# Patient Record
Sex: Male | Born: 1940 | Race: White | Hispanic: No | Marital: Married | Smoking: Former smoker
Health system: Southern US, Community
[De-identification: ages and names within clinical notes are randomized; demographics above are authoritative.]

## PROBLEM LIST (undated history)

## (undated) DIAGNOSIS — I10 Essential (primary) hypertension: Secondary | ICD-10-CM

## (undated) DIAGNOSIS — N189 Chronic kidney disease, unspecified: Secondary | ICD-10-CM

## (undated) DIAGNOSIS — E7800 Pure hypercholesterolemia, unspecified: Secondary | ICD-10-CM

## (undated) DIAGNOSIS — K219 Gastro-esophageal reflux disease without esophagitis: Secondary | ICD-10-CM

## (undated) DIAGNOSIS — M797 Fibromyalgia: Secondary | ICD-10-CM

## (undated) DIAGNOSIS — F419 Anxiety disorder, unspecified: Secondary | ICD-10-CM

## (undated) DIAGNOSIS — G4730 Sleep apnea, unspecified: Secondary | ICD-10-CM

## (undated) DIAGNOSIS — R739 Hyperglycemia, unspecified: Secondary | ICD-10-CM

## (undated) DIAGNOSIS — IMO0001 Reserved for inherently not codable concepts without codable children: Secondary | ICD-10-CM

## (undated) DIAGNOSIS — M109 Gout, unspecified: Secondary | ICD-10-CM

## (undated) DIAGNOSIS — IMO0002 Reserved for concepts with insufficient information to code with codable children: Secondary | ICD-10-CM

## (undated) HISTORY — DX: Chronic kidney disease, unspecified: N18.9

## (undated) HISTORY — DX: Essential (primary) hypertension: I10

## (undated) HISTORY — PX: LITHOTRIPSY: SUR834

## (undated) HISTORY — PX: EYE SURGERY: SHX253

## (undated) HISTORY — PX: BACK SURGERY: SHX140

## (undated) HISTORY — PX: OTHER SURGICAL HISTORY: SHX169

## (undated) HISTORY — DX: Pure hypercholesterolemia, unspecified: E78.00

## (undated) HISTORY — DX: Reserved for concepts with insufficient information to code with codable children: IMO0002

## (undated) HISTORY — DX: Hyperglycemia, unspecified: R73.9

## (undated) HISTORY — PX: CARDIAC CATHETERIZATION: SHX172

## (undated) HISTORY — PX: HERNIA REPAIR: SHX51

---

## 1999-06-18 ENCOUNTER — Encounter: Payer: Self-pay | Admitting: Endocrinology

## 1999-06-18 ENCOUNTER — Ambulatory Visit (HOSPITAL_COMMUNITY): Admission: RE | Admit: 1999-06-18 | Discharge: 1999-06-18 | Payer: Self-pay | Admitting: Endocrinology

## 1999-07-04 ENCOUNTER — Encounter: Payer: Self-pay | Admitting: Neurosurgery

## 1999-07-04 ENCOUNTER — Ambulatory Visit (HOSPITAL_COMMUNITY): Admission: RE | Admit: 1999-07-04 | Discharge: 1999-07-04 | Payer: Self-pay | Admitting: Neurosurgery

## 1999-07-18 ENCOUNTER — Encounter: Payer: Self-pay | Admitting: Neurosurgery

## 1999-07-18 ENCOUNTER — Ambulatory Visit (HOSPITAL_COMMUNITY): Admission: RE | Admit: 1999-07-18 | Discharge: 1999-07-18 | Payer: Self-pay | Admitting: Neurosurgery

## 1999-08-01 ENCOUNTER — Ambulatory Visit (HOSPITAL_COMMUNITY): Admission: RE | Admit: 1999-08-01 | Discharge: 1999-08-01 | Payer: Self-pay | Admitting: Neurosurgery

## 1999-08-01 ENCOUNTER — Encounter: Payer: Self-pay | Admitting: Neurosurgery

## 2000-03-05 ENCOUNTER — Encounter: Admission: RE | Admit: 2000-03-05 | Discharge: 2000-03-05 | Payer: Self-pay | Admitting: Neurosurgery

## 2000-03-05 ENCOUNTER — Encounter: Payer: Self-pay | Admitting: Neurosurgery

## 2000-07-18 ENCOUNTER — Ambulatory Visit (HOSPITAL_COMMUNITY): Admission: RE | Admit: 2000-07-18 | Discharge: 2000-07-18 | Payer: Self-pay | Admitting: Urology

## 2001-10-13 ENCOUNTER — Other Ambulatory Visit: Admission: RE | Admit: 2001-10-13 | Discharge: 2001-10-13 | Payer: Self-pay | Admitting: Urology

## 2002-05-11 ENCOUNTER — Encounter: Admission: RE | Admit: 2002-05-11 | Discharge: 2002-05-11 | Payer: Self-pay | Admitting: Neurosurgery

## 2002-05-11 ENCOUNTER — Encounter: Payer: Self-pay | Admitting: Neurosurgery

## 2002-05-25 ENCOUNTER — Encounter: Admission: RE | Admit: 2002-05-25 | Discharge: 2002-05-25 | Payer: Self-pay | Admitting: Neurosurgery

## 2002-05-25 ENCOUNTER — Encounter: Payer: Self-pay | Admitting: Neurosurgery

## 2002-06-08 ENCOUNTER — Encounter: Admission: RE | Admit: 2002-06-08 | Discharge: 2002-06-08 | Payer: Self-pay | Admitting: Neurosurgery

## 2002-06-08 ENCOUNTER — Encounter: Payer: Self-pay | Admitting: Neurosurgery

## 2004-10-25 ENCOUNTER — Encounter: Admission: RE | Admit: 2004-10-25 | Discharge: 2004-10-25 | Payer: Self-pay | Admitting: Neurosurgery

## 2004-11-10 ENCOUNTER — Encounter: Admission: RE | Admit: 2004-11-10 | Discharge: 2004-11-10 | Payer: Self-pay | Admitting: Neurosurgery

## 2004-11-19 ENCOUNTER — Emergency Department (HOSPITAL_COMMUNITY): Admission: EM | Admit: 2004-11-19 | Discharge: 2004-11-19 | Payer: Self-pay | Admitting: Emergency Medicine

## 2004-12-22 ENCOUNTER — Encounter: Admission: RE | Admit: 2004-12-22 | Discharge: 2004-12-22 | Payer: Self-pay | Admitting: Neurosurgery

## 2008-04-10 ENCOUNTER — Encounter: Admission: RE | Admit: 2008-04-10 | Discharge: 2008-04-10 | Payer: Self-pay | Admitting: Neurosurgery

## 2008-08-01 ENCOUNTER — Encounter: Admission: RE | Admit: 2008-08-01 | Discharge: 2008-08-01 | Payer: Self-pay | Admitting: Neurosurgery

## 2008-08-16 ENCOUNTER — Encounter: Admission: RE | Admit: 2008-08-16 | Discharge: 2008-09-29 | Payer: Self-pay | Admitting: Orthopaedic Surgery

## 2008-10-19 ENCOUNTER — Encounter: Admission: RE | Admit: 2008-10-19 | Discharge: 2008-11-04 | Payer: Self-pay | Admitting: Orthopaedic Surgery

## 2008-11-09 ENCOUNTER — Encounter: Admission: RE | Admit: 2008-11-09 | Discharge: 2008-11-30 | Payer: Self-pay | Admitting: Orthopaedic Surgery

## 2009-05-10 ENCOUNTER — Encounter: Admission: RE | Admit: 2009-05-10 | Discharge: 2009-05-10 | Payer: Self-pay | Admitting: Endocrinology

## 2009-12-13 ENCOUNTER — Encounter: Admission: RE | Admit: 2009-12-13 | Discharge: 2009-12-13 | Payer: Self-pay | Admitting: Endocrinology

## 2010-05-09 ENCOUNTER — Encounter: Admission: RE | Admit: 2010-05-09 | Discharge: 2010-05-09 | Payer: Self-pay | Admitting: Neurosurgery

## 2010-05-24 ENCOUNTER — Encounter: Admission: RE | Admit: 2010-05-24 | Discharge: 2010-05-24 | Payer: Self-pay | Admitting: Neurosurgery

## 2010-10-04 ENCOUNTER — Encounter: Admission: RE | Admit: 2010-10-04 | Discharge: 2010-10-04 | Payer: Self-pay | Admitting: Neurosurgery

## 2010-11-07 ENCOUNTER — Encounter: Payer: Self-pay | Admitting: Internal Medicine

## 2010-11-07 ENCOUNTER — Ambulatory Visit
Admission: RE | Admit: 2010-11-07 | Discharge: 2010-11-07 | Payer: Self-pay | Source: Home / Self Care | Attending: Internal Medicine | Admitting: Internal Medicine

## 2010-11-07 DIAGNOSIS — M51379 Other intervertebral disc degeneration, lumbosacral region without mention of lumbar back pain or lower extremity pain: Secondary | ICD-10-CM | POA: Insufficient documentation

## 2010-11-07 DIAGNOSIS — E785 Hyperlipidemia, unspecified: Secondary | ICD-10-CM | POA: Insufficient documentation

## 2010-11-07 DIAGNOSIS — G2581 Restless legs syndrome: Secondary | ICD-10-CM | POA: Insufficient documentation

## 2010-11-07 DIAGNOSIS — M5137 Other intervertebral disc degeneration, lumbosacral region: Secondary | ICD-10-CM | POA: Insufficient documentation

## 2010-11-07 DIAGNOSIS — IMO0001 Reserved for inherently not codable concepts without codable children: Secondary | ICD-10-CM | POA: Insufficient documentation

## 2010-11-07 DIAGNOSIS — I10 Essential (primary) hypertension: Secondary | ICD-10-CM | POA: Insufficient documentation

## 2010-11-07 LAB — CONVERTED CEMR LAB
Cholesterol, target level: 200 mg/dL
HDL goal, serum: 40 mg/dL
LDL Goal: 130 mg/dL

## 2010-11-08 ENCOUNTER — Other Ambulatory Visit: Payer: Self-pay | Admitting: Internal Medicine

## 2010-11-08 ENCOUNTER — Ambulatory Visit
Admission: RE | Admit: 2010-11-08 | Discharge: 2010-11-08 | Payer: Self-pay | Source: Home / Self Care | Attending: Internal Medicine | Admitting: Internal Medicine

## 2010-11-08 LAB — CBC WITH DIFFERENTIAL/PLATELET
Basophils Absolute: 0 10*3/uL (ref 0.0–0.1)
Basophils Relative: 0.6 % (ref 0.0–3.0)
Eosinophils Absolute: 0.5 10*3/uL (ref 0.0–0.7)
Eosinophils Relative: 8 % — ABNORMAL HIGH (ref 0.0–5.0)
HCT: 32.2 % — ABNORMAL LOW (ref 39.0–52.0)
Hemoglobin: 10.8 g/dL — ABNORMAL LOW (ref 13.0–17.0)
Lymphocytes Relative: 30.7 % (ref 12.0–46.0)
Lymphs Abs: 2.1 10*3/uL (ref 0.7–4.0)
MCHC: 33.5 g/dL (ref 30.0–36.0)
MCV: 94.4 fl (ref 78.0–100.0)
Monocytes Absolute: 0.6 10*3/uL (ref 0.1–1.0)
Monocytes Relative: 9.1 % (ref 3.0–12.0)
Neutro Abs: 3.5 10*3/uL (ref 1.4–7.7)
Neutrophils Relative %: 51.6 % (ref 43.0–77.0)
Platelets: 188 10*3/uL (ref 150.0–400.0)
RBC: 3.41 Mil/uL — ABNORMAL LOW (ref 4.22–5.81)
RDW: 13.5 % (ref 11.5–14.6)
WBC: 6.7 10*3/uL (ref 4.5–10.5)

## 2010-11-08 LAB — IBC PANEL
Iron: 89 ug/dL (ref 42–165)
Saturation Ratios: 27.1 % (ref 20.0–50.0)
Transferrin: 234.7 mg/dL (ref 212.0–360.0)

## 2010-11-08 LAB — LIPID PANEL
Cholesterol: 115 mg/dL (ref 0–200)
HDL: 37.9 mg/dL — ABNORMAL LOW (ref >39.00)
Total CHOL/HDL Ratio: 3
Triglycerides: 241 mg/dL — ABNORMAL HIGH (ref 0.0–149.0)
VLDL: 48.2 mg/dL — ABNORMAL HIGH (ref 0.0–40.0)

## 2010-11-08 LAB — HEPATIC FUNCTION PANEL
ALT: 20 U/L (ref 0–53)
AST: 24 U/L (ref 0–37)
Albumin: 3.5 g/dL (ref 3.5–5.2)
Alkaline Phosphatase: 48 U/L (ref 39–117)
Bilirubin, Direct: 0.1 mg/dL (ref 0.0–0.3)
Total Bilirubin: 0.2 mg/dL — ABNORMAL LOW (ref 0.3–1.2)
Total Protein: 6.7 g/dL (ref 6.0–8.3)

## 2010-11-08 LAB — BASIC METABOLIC PANEL
BUN: 32 mg/dL — ABNORMAL HIGH (ref 6–23)
CO2: 24 mEq/L (ref 19–32)
Calcium: 8.8 mg/dL (ref 8.4–10.5)
Chloride: 111 mEq/L (ref 96–112)
Creatinine, Ser: 1.8 mg/dL — ABNORMAL HIGH (ref 0.4–1.5)
GFR: 39.82 mL/min — ABNORMAL LOW (ref >60.00)
Glucose, Bld: 82 mg/dL (ref 70–99)
Potassium: 5.1 mEq/L (ref 3.5–5.1)
Sodium: 141 mEq/L (ref 135–145)

## 2010-11-08 LAB — PSA: PSA: 0.15 ng/mL (ref 0.10–4.00)

## 2010-11-08 LAB — FERRITIN: Ferritin: 181.3 ng/mL (ref 22.0–322.0)

## 2010-11-08 LAB — TSH: TSH: 1.65 u[IU]/mL (ref 0.35–5.50)

## 2010-11-08 LAB — LDL CHOLESTEROL, DIRECT: Direct LDL: 49.3 mg/dL

## 2010-11-08 LAB — CK: Total CK: 173 U/L (ref 7–232)

## 2010-11-17 ENCOUNTER — Telehealth: Payer: Self-pay | Admitting: Internal Medicine

## 2010-11-25 ENCOUNTER — Encounter: Payer: Self-pay | Admitting: Neurosurgery

## 2010-12-07 NOTE — Progress Notes (Signed)
Summary: Not understanding labs  Phone Note Call from Patient Call back at Home Phone [phone]   Summary of Call: Patient left message on triage requesting somone call him to go over his lab results so he can better understand them. Left message on machine to call back to office. Lucious Groves CMA,  November 17, 2010 3:51 PM  No return call, left message on machine to call back to office. Lucious Groves CMA  November 20, 2010 10:14 AM   Follow-up for Phone Call        I spoke with patient about his labs. He is aware that we can send Lisinopril prescription straight to the pharmacy for him. Follow-up by: Lucious Groves CMA,  November 20, 2010 2:57 PM    New/Updated Medications: LISINOPRIL 20 MG TABS (LISINOPRIL) 1 by mouth qd Prescriptions: LISINOPRIL 20 MG TABS (LISINOPRIL) 1 by mouth qd  #30 x 1   Entered by:   Lucious Groves CMA   Authorized by:   Marga Melnick MD   Signed by:   Lucious Groves CMA on 11/20/2010   Method used:   Faxed to ...       [facility] Public relations account executive (retail)       [address] [place]       [address]       [place], [place]  [address]  [place]       Ph: [phone]       Fax: [phone]   RxID:   [id number]

## 2010-12-07 NOTE — Assessment & Plan Note (Signed)
Summary: new to establish//ph   Vital Signs:  Patient profile:   70 year old male Height:      69.5 inches Weight:      208.6 pounds BMI:     30.47 Temp:     98.4 degrees F oral Pulse rate:   56 / minute Resp:     14 per minute BP sitting:   114 / 70  (left arm) Cuff size:   large  Vitals Entered By: Shonna Chock CMA (November 07, 2010 9:39 AM) CC: New Patient Establish: Discuss changing Lexapro to a less expensive med and discuss Crestor (? related to leg cramps) , Back pain, Lipid Management   CC:  New Patient Establish: Discuss changing Lexapro to a less expensive med and discuss Crestor (? related to leg cramps) , Back pain, and Lipid Management.  History of Present Illness:    Mr Shawn Cruz is her to establish as a new patient;he does not want a CPX.His major issue is DDD for which he sees Dr Trey Sailors on  annual  schedule & as needed . The pain is dull - sharp  located in the mid low back.  The pain radiates to the right leg  to  the knee with numbness & tingling.  The pain is made worse by standing , sitting and flexion.  The pain is made better by inactivity and opioids. The patient denies fecal incontinence, urinary incontinence, and urinary retention.      Hyperlipidemia Follow-Up: The patient reports muscle aches in legs @ night ( his wife descirbes " kicking like an old [place]") and constipation ( he is on narcotic), but denies GI upset, abdominal pain, flushing, itching, diarrhea, and fatigue.  Other symptoms include  occasional pedal edema.  The patient denies the following symptoms: chest pain/pressure, exercise intolerance, dypsnea, palpitations, and syncope.  Compliance with medications (by patient report) has been near 100%.  The patient reports no exercise.  Adjunctive measures currently used by the patient include ASA. Lipids last done 11/2009.     Hypertension Follow-Up: The patient denies lightheadedness, urinary frequency, and headaches.  Compliance with medications (by  patient report) has been near 100%.  Adjunctive measures currently used by the patient include   modified salt restriction.  BP @ home 126-130/ ?.   Lipid Management History:      Positive NCEP/ATP III risk factors include male age 65 years old or older and hypertension.  Negative NCEP/ATP III risk factors include non-diabetic, no family history for ischemic heart disease, non-tobacco-user status, no ASHD (atherosclerotic heart disease), no prior stroke/TIA, no peripheral vascular disease, and no history of aortic aneurysm.     Preventive Screening-Counseling & Management  Alcohol-Tobacco     Smoking Status: quit  Current Medications (verified): 1)  Aspirin 81 Mg Tabs (Aspirin) .[name].. 1 By Mouth Once Daily 2)  Crestor 10 Mg Tabs (Rosuvastatin Calcium) .[name].. 1 By Mouth At Bedtime 3)  Lisinopril-Hydrochlorothiazide 20-12.5 Mg Tabs (Lisinopril-Hydrochlorothiazide) .[name].. 1 By Mouth Once Daily 4)  Hydrocodone-Acetaminophen 10-325 Mg Tabs (Hydrocodone-Acetaminophen) .[name].. 1 By Mouth Up To 6 X Daily As Needed 5)  Lexapro 10 Mg Tabs (Escitalopram Oxalate) .[name].. 1 By Mouth Once Daily 6)  Meloxicam 7.5 Mg Tabs (Meloxicam) .[name].. 1 By Mouth Once Daily  Allergies (verified): 1)  ! Sulfa  Past History:  Past Medical History: DDD LS spine Hyperlipidemia: Framingham Study LDL goal  = < 130. Hypertension  Past Surgical History: Lumbar discectomy  1977; S/P ESI , Dr Channing Mutters  Rotator cuff repair 2011, Dr Azzie Glatter Colonoscopy : negative X 3, due 2021, Dr Kinnie Scales  Family History: Father: deceased- MI in 60s/High Blood Pressure (HTN) Mother: deceased- MI in 70s/High Blood Pressure (HTN)  Siblings: sister  deceased-Abd Aortic Aneurysm  Social History: Retired Married Former Smoker: quit 2009 Alcohol use-yes: minimally Smoking Status:  quit  Review of Systems Psych:  Sytable on Lexapro , but it is "going through the roof" in reference to cost..  Physical Exam  General:  in no acute distress;  alert,appropriate and cooperative throughout examination Neck:  No deformities, masses, or tenderness noted. Chest Wall:  barrel-chest deformity.   Lungs:  Normal respiratory effort, chest expands symmetrically. Lungs are clear to auscultation, no crackles or wheezes. Heart:  regular rhythm, no murmur, no gallop, no rub, no JVD, no HJR, and bradycardia.   Distant heart sounds Abdomen:  Bowel sounds positive,abdomen soft and non-tender without masses, organomegaly or hernias noted. No bruits or AAA Msk:  No deformity or scoliosis noted of thoracic or lumbar spine.   Pulses:  R and L carotid,radial,dorsalis pedis and posterior tibial pulses are full and equal bilaterally Extremities:  No clubbing, cyanosis, edema. Deformed L great nail. Decreased ROM of LE; crepitus of knees  Neurologic:  alert & oriented X3, strength normal in all extremities, and DTRs symmetrical and normal.   Skin:  Intact without suspicious lesions or rashes Cervical Nodes:  No lymphadenopathy noted Axillary Nodes:  No palpable lymphadenopathy Psych:  memory intact for recent and remote, normally interactive, and good eye contact.     Impression & Recommendations:  Problem # 1:  MUSCLE PAIN (ICD-729.1) on statin; R/O RLS  His updated medication list for this problem includes:    Aspirin 81 Mg Tabs (Aspirin) .[name]... 1 by mouth once daily    Hydrocodone-acetaminophen 10-325 Mg Tabs (Hydrocodone-acetaminophen) .[name]... 1 by mouth up to 6 x daily as needed    Meloxicam 7.5 Mg Tabs (Meloxicam) .[name]... 1 by mouth once daily  Problem # 2:  HYPERTENSION (ICD-401.9)  controlled His updated medication list for this problem includes:    Lisinopril-hydrochlorothiazide 20-12.5 Mg Tabs (Lisinopril-hydrochlorothiazide) .[name]... 1 by mouth once daily  Orders: EKG w/ Interpretation (93000)  Problem # 3:  HYPERLIPIDEMIA (ICD-272.4)  His updated medication list for this problem includes:    Crestor 10 Mg Tabs (Rosuvastatin calcium) .[name]... 1  by mouth at bedtime  Problem # 4:  DEGENERATIVE DISC DISEASE, LUMBOSACRAL SPINE (ICD-722.52) as per Dr Channing Mutters  Problem # 5:  RESTLESS LEG SYNDROME (ICD-333.94) suggested by wife's history  Complete Medication List: 1)  Aspirin 81 Mg Tabs (Aspirin) .[name].. 1 by mouth once daily 2)  Crestor 10 Mg Tabs (Rosuvastatin calcium) .[name].. 1 by mouth at bedtime 3)  Lisinopril-hydrochlorothiazide 20-12.5 Mg Tabs (Lisinopril-hydrochlorothiazide) .[name].. 1 by mouth once daily 4)  Hydrocodone-acetaminophen 10-325 Mg Tabs (Hydrocodone-acetaminophen) .[name].. 1 by mouth up to 6 x daily as needed 5)  Meloxicam 7.5 Mg Tabs (Meloxicam) .[name].. 1 by mouth once daily 6)  Citalopram Hydrobromide 20 Mg Tabs (Citalopram hydrobromide) .[name].. 1 once daily in place of lexapro 10 mg  Lipid Assessment/Plan:      Based on NCEP/ATP III, the patient's risk factor category is "0-1 risk factors".  The patient's lipid goals are as follows: Total cholesterol goal is 200; LDL cholesterol goal is 130; HDL cholesterol goal is 40; Triglyceride goal is 150.    Patient Instructions: 1)  Please schedule fasting  labs: 2)  BMP (401.9); 3)  Hepatic Panel  &  CPK ( 995.20); 4)  Lipid Panel (272.4); 5)  TSH (272.4); 6)  CBC w/ Diff, ferritin, IBC (333.94); PSA ( 600.9). 7)  Check your Blood Pressure regularly. If it is above: 135/85 ON AVERAGE  you should make an appointment. Prescriptions: CITALOPRAM HYDROBROMIDE 20 MG TABS (CITALOPRAM HYDROBROMIDE) 1 once daily in place of Lexapro 10 mg  #30 x 5   Entered and Authorized by:   Marga Melnick MD   Signed by:   Marga Melnick MD on 11/07/2010   Method used:   Print then Give to Patient   RxID:   [id number]    Orders Added: 1)  New Patient Level IV [99204] 2)  EKG w/ Interpretation [93000]

## 2011-01-01 ENCOUNTER — Other Ambulatory Visit (INDEPENDENT_AMBULATORY_CARE_PROVIDER_SITE_OTHER): Payer: Medicare Other

## 2011-01-01 ENCOUNTER — Encounter (INDEPENDENT_AMBULATORY_CARE_PROVIDER_SITE_OTHER): Payer: Self-pay | Admitting: *Deleted

## 2011-01-01 ENCOUNTER — Other Ambulatory Visit: Payer: Self-pay

## 2011-01-01 DIAGNOSIS — I10 Essential (primary) hypertension: Secondary | ICD-10-CM

## 2011-01-01 DIAGNOSIS — Z79899 Other long term (current) drug therapy: Secondary | ICD-10-CM

## 2011-01-01 LAB — CBC WITH DIFFERENTIAL/PLATELET
Basophils Absolute: 0 10*3/uL (ref 0.0–0.1)
Basophils Relative: 0.6 % (ref 0.0–3.0)
Eosinophils Absolute: 0.6 10*3/uL (ref 0.0–0.7)
Eosinophils Relative: 8.2 % — ABNORMAL HIGH (ref 0.0–5.0)
HCT: 33.4 % — ABNORMAL LOW (ref 39.0–52.0)
Hemoglobin: 11.4 g/dL — ABNORMAL LOW (ref 13.0–17.0)
Lymphocytes Relative: 22 % (ref 12.0–46.0)
Lymphs Abs: 1.7 10*3/uL (ref 0.7–4.0)
MCHC: 34.1 g/dL (ref 30.0–36.0)
MCV: 91.9 fl (ref 78.0–100.0)
Monocytes Absolute: 0.5 10*3/uL (ref 0.1–1.0)
Neutro Abs: 4.8 10*3/uL (ref 1.4–7.7)
Neutrophils Relative %: 63.1 % (ref 43.0–77.0)
Platelets: 210 10*3/uL (ref 150.0–400.0)
RBC: 3.64 Mil/uL — ABNORMAL LOW (ref 4.22–5.81)
WBC: 7.6 10*3/uL (ref 4.5–10.5)

## 2011-01-01 LAB — B12 AND FOLATE PANEL: Vitamin B-12: 339 pg/mL (ref 211–911)

## 2011-01-01 LAB — BUN: BUN: 29 mg/dL — ABNORMAL HIGH (ref 6–23)

## 2011-01-01 LAB — CREATININE, SERUM: Creatinine, Ser: 1.8 mg/dL — ABNORMAL HIGH (ref 0.4–1.5)

## 2011-01-03 ENCOUNTER — Ambulatory Visit (INDEPENDENT_AMBULATORY_CARE_PROVIDER_SITE_OTHER): Payer: Medicare Other | Admitting: Internal Medicine

## 2011-01-03 ENCOUNTER — Other Ambulatory Visit: Payer: Self-pay | Admitting: Internal Medicine

## 2011-01-03 ENCOUNTER — Encounter: Payer: Self-pay | Admitting: Internal Medicine

## 2011-01-03 DIAGNOSIS — L89899 Pressure ulcer of other site, unspecified stage: Secondary | ICD-10-CM

## 2011-01-03 DIAGNOSIS — N259 Disorder resulting from impaired renal tubular function, unspecified: Secondary | ICD-10-CM | POA: Insufficient documentation

## 2011-01-03 DIAGNOSIS — D649 Anemia, unspecified: Secondary | ICD-10-CM

## 2011-01-03 DIAGNOSIS — I10 Essential (primary) hypertension: Secondary | ICD-10-CM

## 2011-01-03 DIAGNOSIS — Z79899 Other long term (current) drug therapy: Secondary | ICD-10-CM

## 2011-01-03 LAB — HEMOGLOBIN A1C: Hgb A1c MFr Bld: 6.3 % (ref 4.6–6.5)

## 2011-01-05 ENCOUNTER — Other Ambulatory Visit: Payer: Self-pay | Admitting: Neurosurgery

## 2011-01-05 DIAGNOSIS — M47816 Spondylosis without myelopathy or radiculopathy, lumbar region: Secondary | ICD-10-CM

## 2011-01-09 ENCOUNTER — Ambulatory Visit
Admission: RE | Admit: 2011-01-09 | Discharge: 2011-01-09 | Disposition: A | Discharge status: Home / Self Care | Payer: Medicare Other | Source: Ambulatory Visit | Attending: Neurosurgery | Admitting: Neurosurgery

## 2011-01-09 DIAGNOSIS — M47816 Spondylosis without myelopathy or radiculopathy, lumbar region: Secondary | ICD-10-CM

## 2011-01-11 NOTE — Assessment & Plan Note (Signed)
Summary: 6 WEEK FOLLOWUO AND DISCUSS LABS//KB   Vital Signs:  Patient profile:   70 year old male Height:      69.5 inches (176.53 cm) Weight:      212.13 pounds (96.42 kg) BMI:     30.99 Temp:     98.2 degrees F (36.78 degrees C) oral Resp:     15 per minute BP sitting:   120 / 72  (left arm) Cuff size:   regular  Vitals Entered By: Lucious Groves CMA (January 03, 2011 9:04 AM) CC: 6 week f/u and discuss labs./kb, Back pain Comments Patient notes that he has several questions. He has an area on his foot that is not healing x several weeks, and needs Meloxicam replacement. Patient notes that he has been taking his BP at home and it has never read as high as todays reading. I made the patient aware that todays reading is good and automated machines are a good guage but are not as reliable as listening in person. I let the patient rest awhile and BP went down to 110/66.   CC:  6 week f/u and discuss labs./kb and Back pain.  History of Present Illness:      This is a 70 year old man who presents with Back pain which has  increased off Meloxicam due renal insufficiency.  The patient reports loss of sensation  in legs and rest pain, but denies fever, chills, weakness, fecal incontinence, urinary incontinence, urinary retention, and dysuria.  The pain is located in the mid low back.  The pain radiates to the left leg  > R  above  the knee.  The pain is made worse by flexion and activity.  The pain is made better by opioids.  He sees Dr Channing Mutters  03/01.                                              He has had  REDNESS @ R  LATERAL MALLEOLUS W/O INJURY; Rx: Neosporin topical steroid. His Dermatologist recommended "corn pads".                                                                                                             Labs reviewed : creat stable @ 1.8; anemia slightly improved . B12 & folate were WNL.  Current Medications (verified): 1)  Aspirin 81 Mg Tabs (Aspirin) .[name].. 1 By Mouth Once  Daily 2)  Crestor 10 Mg Tabs (Rosuvastatin Calcium) .[name].. 1 By Mouth At Bedtime 3)  Lisinopril 20 Mg Tabs (Lisinopril) .[name].. 1 By Mouth Qd 4)  Hydrocodone-Acetaminophen 10-325 Mg Tabs (Hydrocodone-Acetaminophen) .[name].. 1 By Mouth Up To 6 X Daily As Needed 5)  Citalopram Hydrobromide 20 Mg Tabs (Citalopram Hydrobromide) .[name].. 1 Once Daily in Place of Lexapro 10 Mg  Allergies (verified): 1)  ! Sulfa  Physical Exam  General:  well-nourished,in no acute distress; alert,appropriate and cooperative throughout  examination Lungs:  Normal respiratory effort, chest expands symmetrically. Lungs are clear to auscultation, no crackles or wheezes. Heart:  bradycardia.  S4  Pulses:  R and L carotid,radial,dorsalis pedis and posterior tibial pulses are full and equal bilaterally Extremities:  No clubbing, cyanosis, edema. Chronic nail deformity  Neurologic:  alert & oriented X3 and DTRs symmetrical and normal.  Pain LS area swith SLR; cotton ball felt over feet  yet no painto palpation R ankle lesion Skin:  faint erythema R lat malleolus, non tender Psych:  memory intact for recent and remote, normally interactive, and good eye contact.     Impression & Recommendations:  Problem # 1:  RENAL INSUFFICIENCY (ICD-588.9) Assessment Unchanged  Orders: Nephrology Referral (Nephro) Venipuncture (14782) TLB-A1C / Hgb A1C (Glycohemoglobin) (83036-A1C)  Problem # 2:  ANEMIA (ICD-285.9) due to #1  Problem # 3:  HYPERTENSION (ICD-401.9) well controlled His updated medication list for this problem includes:    Lisinopril 20 Mg Tabs (Lisinopril) .[name]... 1 by mouth qd  Problem # 4:  PRESSURE ULCER OTHER SITE (ICD-707.09)  Orders: Wound Care Center Referral (Wound Care) Venipuncture (95621) TLB-A1C / Hgb A1C (Glycohemoglobin) (83036-A1C)  Complete Medication List: 1)  Aspirin 81 Mg Tabs (Aspirin) .[name].. 1 by mouth once daily 2)  Crestor 10 Mg Tabs (Rosuvastatin calcium) .[name].. 1 by mouth at bedtime 3)   Lisinopril 20 Mg Tabs (Lisinopril) .[name].. 1 by mouth qd 4)  Hydrocodone-acetaminophen 10-325 Mg Tabs (Hydrocodone-acetaminophen) .[name].. 1 by mouth up to 6 x daily as needed 5)  Citalopram Hydrobromide 20 Mg Tabs (Citalopram hydrobromide) .[name].. 1 once daily in place of lexapro 10 mg  Patient Instructions: 1)  Check your Blood Pressure regularly. If it is above: 135/85 ON AVERAGE  you should make an appointment.   Orders Added: 1)  Est. Patient Level IV [30865] 2)  Nephrology Referral [Nephro] 3)  Wound Care Center Referral [Wound Care] 4)  Venipuncture [36415] 5)  TLB-A1C / Hgb A1C (Glycohemoglobin) [83036-A1C]  Appended Document: 6 WEEK FOLLOWUO AND DISCUSS LABS//KB

## 2011-01-16 ENCOUNTER — Encounter (HOSPITAL_BASED_OUTPATIENT_CLINIC_OR_DEPARTMENT_OTHER): Payer: Medicare Other | Attending: Plastic Surgery

## 2011-01-16 DIAGNOSIS — L851 Acquired keratosis [keratoderma] palmaris et plantaris: Secondary | ICD-10-CM | POA: Insufficient documentation

## 2011-01-16 DIAGNOSIS — I8390 Asymptomatic varicose veins of unspecified lower extremity: Secondary | ICD-10-CM | POA: Insufficient documentation

## 2011-01-16 DIAGNOSIS — Z7982 Long term (current) use of aspirin: Secondary | ICD-10-CM | POA: Insufficient documentation

## 2011-01-16 DIAGNOSIS — I10 Essential (primary) hypertension: Secondary | ICD-10-CM | POA: Insufficient documentation

## 2011-01-16 DIAGNOSIS — E119 Type 2 diabetes mellitus without complications: Secondary | ICD-10-CM | POA: Insufficient documentation

## 2011-01-16 DIAGNOSIS — Z79899 Other long term (current) drug therapy: Secondary | ICD-10-CM | POA: Insufficient documentation

## 2011-01-16 DIAGNOSIS — N289 Disorder of kidney and ureter, unspecified: Secondary | ICD-10-CM | POA: Insufficient documentation

## 2011-01-19 ENCOUNTER — Telehealth (INDEPENDENT_AMBULATORY_CARE_PROVIDER_SITE_OTHER): Payer: Self-pay | Admitting: *Deleted

## 2011-01-23 NOTE — Progress Notes (Signed)
Summary: Lisinopril refill  Phone Note Refill Request Message from:  Fax from Pharmacy on January 19, 2011 9:50 AM  Refills Requested: Medication #1:  LISINOPRIL 20 MG TABS 1 by mouth qd [facility], [address], [place], [place]  [address]   phone - [phone]    fax - [phone]  qty - 30  Next Appointment Scheduled: lab = 7/10 Initial call taken by: Jerolyn Shin,  January 19, 2011 9:50 AM    Prescriptions: LISINOPRIL 20 MG TABS (LISINOPRIL) 1 by mouth qd  #30 x 5   Entered by:   Shonna Chock CMA   Authorized by:   Marga Melnick MD   Signed by:   Shonna Chock CMA on 01/19/2011   Method used:   Faxed to ...       [facility] Public relations account executive (retail)       [address] [place]       [address]       [place], [place]  [address]  [place]       Ph: [phone]       Fax: [phone]   RxID:   [id number]

## 2011-01-26 NOTE — H&P (Signed)
  NAME:  Shawn Cruz NO.:  [id number]  MEDICAL RECORD NO.:  [id number]           PATIENT TYPE:  I  LOCATION:  FOOT                         FACILITY:  [facility]  PHYSICIAN:  Wayland Denis, DO      DATE OF BIRTH:  August 16, 1941  DATE OF ADMISSION:  01/16/2011 DATE OF DISCHARGE:                             HISTORY & PHYSICAL   CHIEF COMPLAINT:  Redness of right lateral malleolus.  HISTORY OF PRESENT ILLNESS:  Shawn Cruz is a 70 year old white male who is here with his wife for evaluation of a right lateral ankle redness. He denies any pain or soreness in the area.  Denies any previous trauma. He says it has been going on for several weeks and he is concerned that there may be a wound in the future.  He was recently found to have high blood sugars and states that he is trying to work with his diet.  He does have renal insufficiency as well and is seeing a nephrologist for this.  Some of these are new diagnoses that he is working on.  He is fairly active in golf.  Today, he is not using any lotions or creams and not particularly keeping his leg elevated and has not seen anybody else about this redness.  He has not had any recent illnesses other than the things I stated.  PAST MEDICAL HISTORY:  High blood pressure, diabetes, renal insufficiency, anemia, history of back injury, and cataracts.  SURGICAL HISTORY:  Back surgery.  ALLERGIES:  SULFA.  MEDICATIONS:  Aspirin, Crestor, lisinopril, citalopram, hydrocodone, acetaminophen.  SOCIAL HISTORY:  He is married, lives with his wife and has 2 grown kids.  REVIEW OF SYSTEMS:  He denies any change in his review of systems.  No weight change, hematuria, hematochezia, trouble breathing, chest pain, or abdominal pain.  PHYSICAL EXAMINATION:  GENERAL:  He is alert, oriented, and cooperative, in no acute distress.  He is accompanied by his wife. HEENT:  Pupils were small but equal and reactive.  Extraocular  movements are intact. NECK:  Nontender.  No lymphadenopathy.  Breathing is unlabored. HEART:  Regular.  Distal pulses are equal and regular bilaterally, upper and lower extremity strength 5/5. ABDOMEN:  Soft and nontender.  No organomegaly.  Right lateral ankle is slightly red, but no sign of cellulitis or infection.  Pulses are strong. SKIN:  Quite dry. NEUROLOGIC: Cranial nerves II-XII grossly intact.  ASSESSMENT:  Dry skin and varicose veins.  Plan for compression stockings on a regular basis and keep his leg elevated while he is at home.  Alter his shoes.  Do not rub on the area.  See podiatrist for regular foot care and Eucerin cream daily.  We will see him back as needed.     Wayland Denis, DO     CS/MEDQ  D:  01/16/2011  T:  01/17/2011  Job:  [id number]  Electronically Signed by Wayland Denis  on 01/19/2011 01:29:21 PM

## 2011-01-29 ENCOUNTER — Encounter: Payer: Self-pay | Admitting: Internal Medicine

## 2011-01-29 ENCOUNTER — Ambulatory Visit (INDEPENDENT_AMBULATORY_CARE_PROVIDER_SITE_OTHER): Payer: Medicare Other | Admitting: Internal Medicine

## 2011-01-29 VITALS — BP 94/62 | HR 72 | Temp 98.3°F | Wt 198.8 lb

## 2011-01-29 DIAGNOSIS — R0600 Dyspnea, unspecified: Secondary | ICD-10-CM

## 2011-01-29 DIAGNOSIS — R5383 Other fatigue: Secondary | ICD-10-CM

## 2011-01-29 DIAGNOSIS — I10 Essential (primary) hypertension: Secondary | ICD-10-CM

## 2011-01-29 DIAGNOSIS — R0609 Other forms of dyspnea: Secondary | ICD-10-CM

## 2011-01-29 DIAGNOSIS — N289 Disorder of kidney and ureter, unspecified: Secondary | ICD-10-CM

## 2011-01-29 DIAGNOSIS — R5381 Other malaise: Secondary | ICD-10-CM

## 2011-01-29 MED ORDER — LISINOPRIL 20 MG PO TABS: 10.0000 mg | ORAL_TABLET | Freq: Every day | ORAL | Status: DC

## 2011-01-29 NOTE — Progress Notes (Signed)
  Subjective:    Patient ID: Shawn Cruz, male    DOB: 1941-03-03, 70 y.o.   MRN: [id number]  HPI   He is here for blood pressure followup but he has new symptoms which occurred within the last 2 weeks.  Disease Monitoring  Blood pressure range:97/52-133/69  Chest pain: no; he also denies headaches, nosebleeds, palpitations, claudication symptoms, and syncope.  NEW ONSET:DOE &  FATIGUE  Onset:10 days ago of fatigue even  without  exertion: Physical limitations: essentially sedentary Primarily motivational fatigue:no Primarily physical fatigue: yes  Symptoms Fever:no Night sweats:yes;Weight loss:no Exertional chest pain:no Cough:no Hemoptysis: no;New medications:steroid shot 2 weeks ago PND:no;Melena:no Adenopathy:no Severe snoring:improved as per wife; intermittent mild apnea. Remotely he has had a sleep apnea( ? 2004) study which was negative. Daytime sleepiness:yes  Skin changes:dry; no nail or hair changes  Feeling depressed:no Anhedonia:no Altered appetite:no Poor sleep: no                                                                                                                      BP assessment continued :  Medication compliance:good Medication Side Effects  Lightheadedness:no  Urinary frequency:minimal  Edema:improved   Preventitive Healthcare:  Exercise:none due to fatigue  Diet Pattern:decreased sugar , bread, HFCS Salt Restriction:yes      Review of Systemssee above    Objective:   Physical Exam  On exam he is in no acute distress. There's no jaundice or scleral icterus. Extraocular motion is intact. Minimal midline is present.   There is no lymphadenopathy about the head neck or axilla. Thyroid is normal to palpation without nodularity.  Chest is clear with no rales rhonchi or increased work of breathing    rhythm is regular with an S4; no significant murmurs or gallops are present.   he has no organomegaly or masses.   there is no pedal  edema; he has no cyanosis or clubbing of the nailbeds.   All pulses are intact no bruits were noted. Deep tendon  reflexes are normal; he has no tremor.  affect and mood appear to be normal.        Assessment & Plan:   #1 hypertension extremely well controlled if not controlled.    #2 fatigue present for approximately 2 weeks    #3 dyspnea    #4 snoring and possible apnea with a past medical history is negative evaluation

## 2011-01-29 NOTE — Patient Instructions (Addendum)
Please DECREASE Lisinopril to 20 mg 1/2 pill daily. Please  monitor your blood pressure routinely through the week; your goal was an average less than 135/85. If it averages greater than 140/90 you should be seen.    I am asking your spouse to observe for possible sleep apnea ; this process can cause fatigue although usually would be of long-standing. If the evaluation is negative and symptoms persist or progress we may need to consider repeating the sleep study.

## 2011-01-30 ENCOUNTER — Other Ambulatory Visit: Payer: Self-pay | Admitting: Internal Medicine

## 2011-01-30 ENCOUNTER — Ambulatory Visit (INDEPENDENT_AMBULATORY_CARE_PROVIDER_SITE_OTHER)
Admission: RE | Admit: 2011-01-30 | Discharge: 2011-01-30 | Disposition: A | Discharge status: Home / Self Care | Payer: Medicare Other | Source: Ambulatory Visit | Attending: Internal Medicine | Admitting: Internal Medicine

## 2011-01-30 DIAGNOSIS — R0609 Other forms of dyspnea: Secondary | ICD-10-CM

## 2011-01-30 DIAGNOSIS — N19 Unspecified kidney failure: Secondary | ICD-10-CM

## 2011-01-30 DIAGNOSIS — R0600 Dyspnea, unspecified: Secondary | ICD-10-CM

## 2011-01-30 DIAGNOSIS — E875 Hyperkalemia: Secondary | ICD-10-CM

## 2011-01-30 LAB — BASIC METABOLIC PANEL
CO2: 18 mEq/L — ABNORMAL LOW (ref 19–32)
Calcium: 8.9 mg/dL (ref 8.4–10.5)
Chloride: 107 mEq/L (ref 96–112)
GFR: 36.96 mL/min — ABNORMAL LOW (ref >60.00)
Potassium: 6.1 mEq/L (ref 3.5–5.1)
Sodium: 134 mEq/L — ABNORMAL LOW (ref 135–145)

## 2011-01-30 LAB — TSH: TSH: 1.41 u[IU]/mL (ref 0.35–5.50)

## 2011-01-30 LAB — CBC WITH DIFFERENTIAL/PLATELET
Basophils Absolute: 0 10*3/uL (ref 0.0–0.1)
Basophils Relative: 0.2 % (ref 0.0–3.0)
Eosinophils Absolute: 0.3 10*3/uL (ref 0.0–0.7)
Eosinophils Relative: 2.9 % (ref 0.0–5.0)
HCT: 40 % (ref 39.0–52.0)
Lymphocytes Relative: 31 % (ref 12.0–46.0)
Lymphs Abs: 2.7 10*3/uL (ref 0.7–4.0)
MCHC: 33.9 g/dL (ref 30.0–36.0)
MCV: 92.5 fl (ref 78.0–100.0)
Monocytes Absolute: 0.8 10*3/uL (ref 0.1–1.0)
Monocytes Relative: 9.3 % (ref 3.0–12.0)
Neutrophils Relative %: 56.6 % (ref 43.0–77.0)
Platelets: 161 10*3/uL (ref 150.0–400.0)
RDW: 13.1 % (ref 11.5–14.6)
WBC: 8.8 10*3/uL (ref 4.5–10.5)

## 2011-01-31 ENCOUNTER — Telehealth: Payer: Self-pay

## 2011-01-31 MED ORDER — FUROSEMIDE 40 MG PO TABS: 40.0000 mg | ORAL_TABLET | Freq: Every day | ORAL | Status: DC

## 2011-01-31 NOTE — Telephone Encounter (Signed)
Potassium is elevated significantly in the setting of renal impairment which is stable to slightly worse. I have reviewed medications; none of these should be causing the elevated potassium. Avoid all potassium-containing foods such as citrus. Nephrology referral is indicated. The anemia has resolved and other studies are essentially negative.  Spoke with patient about lab results, patient ok'd all information and aware copy of labs to be mailed

## 2011-01-31 NOTE — Telephone Encounter (Signed)
Message copied by Stephan Minister on Wed Jan 31, 2011  9:50 AM ------      Message from: Marga Melnick      Created: Tue Jan 30, 2011  6:29 PM       Potassium is elevated significantly in the setting of renal impairment which is stable to slightly worse. I have reviewed medications; none of these should be causing the elevated potassium. Avoid all potassium-containing foods such as citrus. Nephrology referral is indicated. The anemia has resolved and other studies are essentially negative.

## 2011-01-31 NOTE — Telephone Encounter (Signed)
Called patient back to give additional information regarding labs (Info was sent to Triage Assistant-Chemira). Patient aware rx was sent to pharmacy and schedule appointment for Monday 02/04/11 to recheck labs

## 2011-01-31 NOTE — Telephone Encounter (Signed)
Copied from staff messages- K+ very high due to kidney impairment( Nephrology referral made) ; this fluid pill will reduce K+. Recheck BUN,creat ,K+ in 5 days. This is remotely related to sulfa antibiotics; report rash or fever. I do not expect this

## 2011-02-05 ENCOUNTER — Telehealth: Payer: Self-pay | Admitting: Internal Medicine

## 2011-02-05 ENCOUNTER — Other Ambulatory Visit (INDEPENDENT_AMBULATORY_CARE_PROVIDER_SITE_OTHER): Payer: Medicare Other

## 2011-02-05 DIAGNOSIS — E875 Hyperkalemia: Secondary | ICD-10-CM

## 2011-02-05 LAB — POTASSIUM: Potassium: 5.5 mEq/L — ABNORMAL HIGH (ref 3.5–5.1)

## 2011-02-05 LAB — BUN: BUN: 62 mg/dL — ABNORMAL HIGH (ref 6–23)

## 2011-02-05 NOTE — Telephone Encounter (Signed)
Wife would like for you to call her after 2:15 or 2:30 today.

## 2011-02-05 NOTE — Telephone Encounter (Signed)
Spoke w/ pt wife says he is still no doing well and would like to know what could be done. Says he seems lifeless and goes to bed at night and has excessive sweating., also reported low bp. Just would like to have some answers for what could be going on. Scheduled appt to f/u w/ Hop.

## 2011-02-07 ENCOUNTER — Encounter: Payer: Self-pay | Admitting: Internal Medicine

## 2011-02-07 ENCOUNTER — Ambulatory Visit (INDEPENDENT_AMBULATORY_CARE_PROVIDER_SITE_OTHER): Payer: Medicare Other | Admitting: Internal Medicine

## 2011-02-07 DIAGNOSIS — R5383 Other fatigue: Secondary | ICD-10-CM

## 2011-02-07 DIAGNOSIS — E785 Hyperlipidemia, unspecified: Secondary | ICD-10-CM

## 2011-02-07 DIAGNOSIS — R5381 Other malaise: Secondary | ICD-10-CM

## 2011-02-07 DIAGNOSIS — N259 Disorder resulting from impaired renal tubular function, unspecified: Secondary | ICD-10-CM

## 2011-02-07 DIAGNOSIS — D649 Anemia, unspecified: Secondary | ICD-10-CM

## 2011-02-07 NOTE — Progress Notes (Signed)
  Subjective:    Patient ID: Shawn Cruz, male    DOB: Jan 23, 1941, 70 y.o.   MRN: [id number]  HPI He describes ongoing fatigue; he is also having night sweats and has had  a 10 pound weight loss since January. There's been some dryness to his skin but no other hair or nail changes. He denies any productive cough or hemoptysis.CXray 02/08 revealed possible chronic bronchitis. He also denies DOE or  paroxysmal nocturnal dyspnea, chest pain, palpitations  or edema. His wife states he does have some minor apnea.  Labs from January,4,2012 were reviewed. He has significant, but essentially stable, renal insufficiency with creatinines in the range of 1.8-1.9. He has no knowledge of prior renal insufficiency. On 3/26 his potassium was 6.1; furosemide 40 mg daily was prescribed. Potassium dropped to 5.5. His GFR is in the range of 37-40. Lipids are extremely good on Crestor 10 mg daily. Her TSH has been normal x2.A1c is 6.3% indicating good  Diabetes control.  Initially he was found to have a mild anemia with a hematocrit of 32.3. On 3/26 the hematocrit was 40. He denies epistaxis, hemoptysis, rectal bleeding, melena or abnormal bruising or bleeding.    He describes increased thirst &  Urination on Lasix. He has some  lightheadedness with standing, He denies any new ulcers or sores on the skin which are nonhealing especially over the feet. He denies numbness or tingling or burning in his  Feet.He does not monitor glucoses.        Review of Systems     Objective:   Physical Exam on exam he appears fatigued but in no acute distress. He has no scleral icterus or jaundice.  I can appreciate no lymphadenopathy about the neck or axilla. There is dullness to percussion in  the right upper quadrant without definite organomegaly  He has an S4 gallop without significant murmurs .No NVD or HJR is present. Chest is clear with no increased work of breathing, rales or rhonchi.  He has no edema. No clubbing or  cyanosis is present.  Skin is warm and dry ; no worrisome skin lesions are present.  He is oriented x3; affect is flattened in keeping with the stated degree of fatigue.       Assessment & Plan:  #1 profound fatigue; this is in the context of renal insufficiency which he believes is new. He also has had a mild anemia.  #2 possible sleep apnea  #3 diabetes well controlled  #4 dyslipidemia, well controlled  Plan: The CBC will be rechecked.  His B12, folate, and iron levels have been normal. Anemia is most likely due to the renal insufficiency.  An ultrasound of the kidneys will be performed. He has an appointment to see the nephrologist. He'll be asked to obtain his last extensive lab profile from his prior physician.  If these evaluations are negative I will recommend a sleep study for apnea.

## 2011-02-07 NOTE — Patient Instructions (Signed)
Please complete stool cards. I've asked Shawn Cruz to keep a diary of  possible sleep apnea as to frequency and duration of events.   Please decrease her Crestor to 5 mg daily (half of a 10 mg pill). Fasting labs should be rechecked in 10 weeks (lipids, hepatic panel; 272.4, 995.2).

## 2011-02-08 LAB — CBC WITH DIFFERENTIAL/PLATELET
Basophils Relative: 0.1 % (ref 0.0–3.0)
Eosinophils Relative: 2.1 % (ref 0.0–5.0)
HCT: 37.6 % — ABNORMAL LOW (ref 39.0–52.0)
Hemoglobin: 12.8 g/dL — ABNORMAL LOW (ref 13.0–17.0)
Lymphs Abs: 2 10*3/uL (ref 0.7–4.0)
Monocytes Relative: 8.1 % (ref 3.0–12.0)
Neutro Abs: 5.9 10*3/uL (ref 1.4–7.7)
RBC: 4.09 Mil/uL — ABNORMAL LOW (ref 4.22–5.81)
WBC: 8.8 10*3/uL (ref 4.5–10.5)

## 2011-02-13 ENCOUNTER — Ambulatory Visit
Admission: RE | Admit: 2011-02-13 | Discharge: 2011-02-13 | Disposition: A | Discharge status: Home / Self Care | Payer: Medicare Other | Source: Ambulatory Visit | Attending: Internal Medicine | Admitting: Internal Medicine

## 2011-02-13 ENCOUNTER — Encounter (HOSPITAL_BASED_OUTPATIENT_CLINIC_OR_DEPARTMENT_OTHER): Payer: Medicare Other

## 2011-02-13 DIAGNOSIS — N259 Disorder resulting from impaired renal tubular function, unspecified: Secondary | ICD-10-CM

## 2011-02-13 DIAGNOSIS — R5381 Other malaise: Secondary | ICD-10-CM

## 2011-02-21 ENCOUNTER — Other Ambulatory Visit: Payer: Self-pay | Admitting: Nephrology

## 2011-02-21 DIAGNOSIS — N289 Disorder of kidney and ureter, unspecified: Secondary | ICD-10-CM

## 2011-02-23 ENCOUNTER — Ambulatory Visit
Admission: RE | Admit: 2011-02-23 | Discharge: 2011-02-23 | Disposition: A | Discharge status: Home / Self Care | Payer: Medicare Other | Source: Ambulatory Visit | Attending: Nephrology | Admitting: Nephrology

## 2011-02-23 DIAGNOSIS — N289 Disorder of kidney and ureter, unspecified: Secondary | ICD-10-CM

## 2011-03-28 ENCOUNTER — Encounter: Payer: Self-pay | Admitting: Internal Medicine

## 2011-05-08 ENCOUNTER — Other Ambulatory Visit (INDEPENDENT_AMBULATORY_CARE_PROVIDER_SITE_OTHER): Payer: Medicare Other

## 2011-05-08 DIAGNOSIS — E119 Type 2 diabetes mellitus without complications: Secondary | ICD-10-CM

## 2011-05-08 NOTE — Progress Notes (Signed)
Labs only

## 2011-05-08 NOTE — Progress Notes (Signed)
Addended by: Legrand Como on: 05/08/2011 11:15 AM   Modules accepted: Orders

## 2011-05-10 ENCOUNTER — Telehealth: Payer: Self-pay | Admitting: Internal Medicine

## 2011-05-10 MED ORDER — CITALOPRAM HYDROBROMIDE 20 MG PO TABS: 20.0000 mg | ORAL_TABLET | Freq: Every day | ORAL | Status: DC

## 2011-05-10 NOTE — Telephone Encounter (Signed)
Last refilled in Jan 2012 and pt was seen in Feb 2012. 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

## 2011-05-11 LAB — HEMOGLOBIN A1C: Hgb A1c MFr Bld: 5.9 % (ref 4.6–6.5)

## 2011-05-15 ENCOUNTER — Other Ambulatory Visit: Payer: Medicare Other

## 2011-05-15 ENCOUNTER — Ambulatory Visit: Payer: Medicare Other | Admitting: Family Medicine

## 2011-05-18 ENCOUNTER — Encounter: Payer: Self-pay | Admitting: Family Medicine

## 2011-05-18 ENCOUNTER — Ambulatory Visit (INDEPENDENT_AMBULATORY_CARE_PROVIDER_SITE_OTHER): Payer: Medicare Other | Admitting: Family Medicine

## 2011-05-18 DIAGNOSIS — R739 Hyperglycemia, unspecified: Secondary | ICD-10-CM

## 2011-05-18 DIAGNOSIS — E785 Hyperlipidemia, unspecified: Secondary | ICD-10-CM

## 2011-05-18 DIAGNOSIS — N259 Disorder resulting from impaired renal tubular function, unspecified: Secondary | ICD-10-CM

## 2011-05-18 DIAGNOSIS — R7309 Other abnormal glucose: Secondary | ICD-10-CM

## 2011-05-18 DIAGNOSIS — E119 Type 2 diabetes mellitus without complications: Secondary | ICD-10-CM | POA: Insufficient documentation

## 2011-05-18 DIAGNOSIS — I10 Essential (primary) hypertension: Secondary | ICD-10-CM

## 2011-05-18 NOTE — Assessment & Plan Note (Signed)
Pt's A1C now normal.  Has never been on meds for elevated sugars and has not made drastic changes to diet or exercise regimen.  At this point will consider pt borderline and follow at least every 6 months.  Reviewed importance of healthy diet, limiting his carb intake.  Pt expressed understanding and is in agreement w/ plan.

## 2011-05-18 NOTE — Assessment & Plan Note (Signed)
Following regularly w/ Dr Caryn Section.  This is likely the cause of his anemia.  Will continue to follow along and assist as able.

## 2011-05-18 NOTE — Patient Instructions (Signed)
Schedule a fasting lab visit at your convenience We'll notify you of your lab results when available and make med changes as needed Continue your current meds Call with any questions or concerns Welcome!  I'm glad to have you!

## 2011-05-18 NOTE — Assessment & Plan Note (Signed)
Pt is tolerating statin w/out difficulty.  Due for labs.  Will return when fasting.

## 2011-05-18 NOTE — Progress Notes (Signed)
  Subjective:    Patient ID: Shawn Cruz, male    DOB: 1941-03-20, 70 y.o.   MRN: [id number]  HPI Hyperglycemia- dx'd in Feb w/ A1C of 6.3.  Labs done on 7/3 show pt's level is 5.9.  Denies sxs- dizziness, increased hunger/thirst, HAs, visual changes.  Reports he was confused by dx and doesn't know what his most recent labs means.  'did it go away?'  Renal insufficiency- reports kidneys are working at '50% of what they should be'.  Follows w/ Dr Caryn Section.  Tried to decrease fluid pill to 1/2 tab and had problem w/ edema.  Increased back to 1 tab w/ good results.  HTN- chronic problem for pt but recently BP has been running too low and pt was fatigued.  Stopped Lisinopril at recommendation of Dr Caryn Section.  BP has been excellent.   Review of Systems For ROS see HPI     Objective:   Physical Exam  Constitutional: He is oriented to person, place, and time. He appears well-developed and well-nourished. No distress.  HENT:  Head: Normocephalic and atraumatic.  Eyes: Conjunctivae and EOM are normal. Pupils are equal, round, and reactive to light.  Neck: Normal range of motion. Neck supple. No thyromegaly present.  Cardiovascular: Normal rate, regular rhythm, normal heart sounds and intact distal pulses.   Pulmonary/Chest: Effort normal and breath sounds normal. No respiratory distress. He has no wheezes.  Abdominal: Soft. He exhibits no distension. There is no tenderness. There is no rebound.  Musculoskeletal: He exhibits no edema.  Lymphadenopathy:    He has no cervical adenopathy.  Neurological: He is alert and oriented to person, place, and time. No cranial nerve deficit. Coordination normal.  Skin: Skin is warm and dry.  Psychiatric: He has a normal mood and affect. His behavior is normal.          Assessment & Plan:

## 2011-05-18 NOTE — Assessment & Plan Note (Signed)
Pt has actually been hypotensive.  Has stopped ACE.  BP remains well controlled.  Pt asymptomatic.

## 2011-05-21 ENCOUNTER — Other Ambulatory Visit: Payer: Self-pay | Admitting: Family Medicine

## 2011-05-22 ENCOUNTER — Other Ambulatory Visit (INDEPENDENT_AMBULATORY_CARE_PROVIDER_SITE_OTHER): Payer: Medicare Other

## 2011-05-22 DIAGNOSIS — E785 Hyperlipidemia, unspecified: Secondary | ICD-10-CM

## 2011-05-22 DIAGNOSIS — I10 Essential (primary) hypertension: Secondary | ICD-10-CM

## 2011-05-22 DIAGNOSIS — R739 Hyperglycemia, unspecified: Secondary | ICD-10-CM

## 2011-05-22 DIAGNOSIS — R7309 Other abnormal glucose: Secondary | ICD-10-CM

## 2011-05-22 LAB — HEPATIC FUNCTION PANEL
ALT: 14 U/L (ref 0–53)
AST: 22 U/L (ref 0–37)
Albumin: 4.3 g/dL (ref 3.5–5.2)

## 2011-05-22 LAB — CBC WITH DIFFERENTIAL/PLATELET
Basophils Absolute: 0.1 10*3/uL (ref 0.0–0.1)
Basophils Relative: 1 % (ref 0.0–3.0)
Eosinophils Absolute: 0.5 10*3/uL (ref 0.0–0.7)
MCHC: 33.3 g/dL (ref 30.0–36.0)
MCV: 89.2 fl (ref 78.0–100.0)
Monocytes Absolute: 0.7 10*3/uL (ref 0.1–1.0)
Neutrophils Relative %: 57.6 % (ref 43.0–77.0)
RBC: 3.9 Mil/uL — ABNORMAL LOW (ref 4.22–5.81)
RDW: 12.8 % (ref 11.5–14.6)

## 2011-05-22 LAB — LIPID PANEL
HDL: 38.4 mg/dL — ABNORMAL LOW (ref >39.00)
Triglycerides: 112 mg/dL (ref 0.0–149.0)
VLDL: 22.4 mg/dL (ref 0.0–40.0)

## 2011-05-22 LAB — BASIC METABOLIC PANEL
CO2: 26 mEq/L (ref 19–32)
Calcium: 9.1 mg/dL (ref 8.4–10.5)
GFR: 36.92 mL/min — ABNORMAL LOW (ref >60.00)
Potassium: 4.4 mEq/L (ref 3.5–5.1)
Sodium: 140 mEq/L (ref 135–145)

## 2011-05-22 NOTE — Progress Notes (Signed)
Labs only

## 2011-06-13 ENCOUNTER — Other Ambulatory Visit: Payer: Self-pay | Admitting: Neurosurgery

## 2011-06-13 DIAGNOSIS — M47816 Spondylosis without myelopathy or radiculopathy, lumbar region: Secondary | ICD-10-CM

## 2011-06-14 ENCOUNTER — Ambulatory Visit
Admission: RE | Admit: 2011-06-14 | Discharge: 2011-06-14 | Disposition: A | Discharge status: Home / Self Care | Payer: Medicare Other | Source: Ambulatory Visit | Attending: Neurosurgery | Admitting: Neurosurgery

## 2011-06-14 DIAGNOSIS — M47816 Spondylosis without myelopathy or radiculopathy, lumbar region: Secondary | ICD-10-CM

## 2011-06-14 MED ORDER — IOHEXOL 180 MG/ML  SOLN
1.0000 mL | Freq: Once | INTRAMUSCULAR | Status: AC | PRN
  Administered 2011-06-14: 1 mL via EPIDURAL

## 2011-06-14 MED ORDER — METHYLPREDNISOLONE ACETATE 40 MG/ML INJ SUSP (RADIOLOG
120.0000 mg | Freq: Once | INTRAMUSCULAR | Status: AC
  Administered 2011-06-14: 120 mg via EPIDURAL

## 2011-06-25 ENCOUNTER — Telehealth: Payer: Self-pay | Admitting: *Deleted

## 2011-06-25 NOTE — Telephone Encounter (Signed)
Pt states that BP has been running high around 132-144 and  85-95. Pt notes that on 1 day he did experience some dizziness but denies any other symptoms. Pt indicated that he recently had a epidural prior to BP elevation. Pt is not taking any med for BP due to hypotension.. Please advise

## 2011-06-25 NOTE — Telephone Encounter (Signed)
Left message to call office

## 2011-06-25 NOTE — Telephone Encounter (Signed)
If pt's BP remains elevated he can restart 1/2 tab of Lisinopril daily and follow up in 3-4 weeks.  Goal is to have BP less than 140/90

## 2011-06-25 NOTE — Telephone Encounter (Signed)
Discuss with patient  

## 2011-07-19 ENCOUNTER — Ambulatory Visit (INDEPENDENT_AMBULATORY_CARE_PROVIDER_SITE_OTHER): Payer: Medicare Other | Admitting: Internal Medicine

## 2011-07-19 ENCOUNTER — Encounter: Payer: Self-pay | Admitting: Internal Medicine

## 2011-07-19 DIAGNOSIS — M109 Gout, unspecified: Secondary | ICD-10-CM | POA: Insufficient documentation

## 2011-07-19 LAB — URIC ACID: Uric Acid, Serum: 10.9 mg/dL — ABNORMAL HIGH (ref 4.0–7.8)

## 2011-07-19 MED ORDER — PREDNISONE 10 MG PO TABS: ORAL_TABLET | ORAL | Status: AC

## 2011-07-19 MED ORDER — COLCHICINE 0.6 MG PO TABS: 0.6000 mg | ORAL_TABLET | Freq: Every day | ORAL | Status: DC

## 2011-07-19 NOTE — Progress Notes (Signed)
  Subjective:    Patient ID: Shawn Cruz, male    DOB: 09/30/1941, 70 y.o.   MRN: [id number]  HPI 2 days  history of severe and sudden pain, redness and swelling at the base of the left great toe.  Past Medical History  Diagnosis Date  . DDD (degenerative disc disease)     LS spine  . Hyperglycemia   . Hypertension   . Chronic kidney disease    Past Surgical History  Procedure Date  . Lumbar discetomy     1977  . Rotator cuff repair     Dr. Cleophas Dunker      Review of Systems Denies any fevers, no injuries. His diet has not changed it, he has been eating liver all his life several times a week. He reports a episode of gout 30 years ago, no symptoms since then.     Objective:   Physical Exam  Constitutional: He appears well-developed and well-nourished.  HENT:  Head: Normocephalic and atraumatic.  Musculoskeletal:       Right foot normal. Left foot normal except for mild swelling and redness around the first MTP joint. ++ pain with palpation and manipulation of the area. There is no injuries, cuts or evidence of cellulitis.          Assessment & Plan:

## 2011-07-19 NOTE — Patient Instructions (Addendum)
Diet ! Avoid NSAIDs (motrin, aleve, etc) Low dose prednisone , 20 mg daily x 5 days Schedule to see my nurse tomorrow just to check your sugar colchine twice a day  x 5 days  Ice, foot elevation Hold crestor while on colchicine

## 2011-07-19 NOTE — Assessment & Plan Note (Addendum)
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 Reassess in 2 weeks , depending on how he does, may need allopurinol but this is the first episode in 30 years.

## 2011-07-20 ENCOUNTER — Telehealth: Payer: Self-pay

## 2011-07-20 ENCOUNTER — Ambulatory Visit (INDEPENDENT_AMBULATORY_CARE_PROVIDER_SITE_OTHER): Payer: Medicare Other

## 2011-07-20 DIAGNOSIS — T887XXA Unspecified adverse effect of drug or medicament, initial encounter: Secondary | ICD-10-CM

## 2011-07-20 DIAGNOSIS — R7309 Other abnormal glucose: Secondary | ICD-10-CM

## 2011-07-20 LAB — GLUCOSE, POCT (MANUAL RESULT ENTRY): POC Glucose: 180

## 2011-07-20 NOTE — Patient Instructions (Signed)
Per Dr.Paz blood sugar ok, patient informed.

## 2011-07-20 NOTE — Telephone Encounter (Signed)
Message copied by Beverely Low on Fri Jul 20, 2011  9:06 AM ------      Message from: Willow Ora E      Created: Thu Jul 19, 2011  7:35 PM       Advise patient:      Uric acid is elevated, confirms gout, plan is the same

## 2011-07-20 NOTE — Telephone Encounter (Signed)
LMTCB

## 2011-07-20 NOTE — Telephone Encounter (Signed)
Message copied by Beverely Low on Fri Jul 20, 2011  9:30 AM ------      Message from: Shawn Cruz      Created: Thu Jul 19, 2011  7:35 PM       Advise patient:      Uric acid is elevated, confirms gout, plan is the same

## 2011-07-20 NOTE — Telephone Encounter (Signed)
Pt aware and has appointment for blood sugar check at 2pm today

## 2011-08-02 ENCOUNTER — Encounter: Payer: Self-pay | Admitting: Family Medicine

## 2011-08-02 ENCOUNTER — Ambulatory Visit (INDEPENDENT_AMBULATORY_CARE_PROVIDER_SITE_OTHER): Payer: Medicare Other | Admitting: Family Medicine

## 2011-08-02 DIAGNOSIS — I10 Essential (primary) hypertension: Secondary | ICD-10-CM

## 2011-08-02 DIAGNOSIS — Z79899 Other long term (current) drug therapy: Secondary | ICD-10-CM

## 2011-08-02 DIAGNOSIS — R632 Polyphagia: Secondary | ICD-10-CM | POA: Insufficient documentation

## 2011-08-02 DIAGNOSIS — M109 Gout, unspecified: Secondary | ICD-10-CM

## 2011-08-02 NOTE — Progress Notes (Signed)
  Subjective:    Patient ID: Shawn Cruz, male    DOB: 08-23-1941, 70 y.o.   MRN: [id number]  HPI HTN- chronic problem for pt.  BP this AM was 140/81.  Is taking Lasix 20mg  as of Tuesday but not taking Lisinopril.  No CP, SOB, HAs, visual changes, edema.  Gout- was seen on 9/13 w/ painful toe.  Was started on colchicine for this.  Currently pain free.  Wants to know if he can stop and restart Crestor.  Increased hunger- reports associated sweats at night, 'i'm eating constantly'.  sxs started 2-3 months ago.  Wt is stable.  Pt also notes increased thirst.  Also having increased urination but is on Lasix.   Review of Systems For ROS see HPI     Objective:   Physical Exam  Vitals reviewed. Constitutional: He is oriented to person, place, and time. He appears well-developed and well-nourished. No distress.  HENT:  Head: Normocephalic and atraumatic.  Eyes: Conjunctivae and EOM are normal. Pupils are equal, round, and reactive to light.  Neck: Normal range of motion. Neck supple. No thyromegaly present.  Cardiovascular: Normal rate, regular rhythm, normal heart sounds and intact distal pulses.   No murmur heard. Pulmonary/Chest: Effort normal and breath sounds normal. No respiratory distress.  Abdominal: Soft. Bowel sounds are normal. He exhibits no distension.  Musculoskeletal: He exhibits no edema.  Lymphadenopathy:    He has no cervical adenopathy.  Neurological: He is alert and oriented to person, place, and time. No cranial nerve deficit.  Skin: Skin is warm and dry.  Psychiatric: He has a normal mood and affect. His behavior is normal.          Assessment & Plan:

## 2011-08-02 NOTE — Patient Instructions (Signed)
We'll notify you of your lab results and based on these determine the next step Keep up the good work- you look great! Call with any questions or concerns Hang in there!!!!

## 2011-08-02 NOTE — Assessment & Plan Note (Signed)
BP excellently controlled off meds.  Asymptomatic.  Will follow.

## 2011-08-02 NOTE — Assessment & Plan Note (Signed)
Resolved.  Stop colchicine.  Restart cholesterol med.

## 2011-08-02 NOTE — Assessment & Plan Note (Signed)
Pt's sxs are concerning for uncontrolled hyperglycemia/diabetes.  Check labs- r/o thyroid abnormality.  Next steps will depend on lab results.

## 2011-08-03 LAB — HEMOGLOBIN A1C: Hgb A1c MFr Bld: 6.7 % — ABNORMAL HIGH (ref 4.6–6.5)

## 2011-08-03 LAB — BASIC METABOLIC PANEL
Calcium: 8.8 mg/dL (ref 8.4–10.5)
Creatinine, Ser: 1.7 mg/dL — ABNORMAL HIGH (ref 0.4–1.5)

## 2011-08-03 LAB — TSH: TSH: 0.61 u[IU]/mL (ref 0.35–5.50)

## 2011-08-06 ENCOUNTER — Telehealth: Payer: Self-pay

## 2011-08-06 NOTE — Telephone Encounter (Signed)
Yes- he has diabetes.  At this time, he does not need a meter.  He is to work on the dietary recommendations.  If he continues to have night sweats or feels shaky- yes, he will need a meter and instructions on use (OV)

## 2011-08-06 NOTE — Telephone Encounter (Signed)
Pt would like to know if you are diagnosing him with diabetes and if he needs a meter.  Pls advise.

## 2011-08-06 NOTE — Telephone Encounter (Signed)
Pt aware.

## 2011-08-06 NOTE — Progress Notes (Signed)
Quick Note:  Pt aware ______ 

## 2011-08-07 ENCOUNTER — Other Ambulatory Visit: Payer: Self-pay | Admitting: Family Medicine

## 2011-08-07 MED ORDER — ROSUVASTATIN CALCIUM 10 MG PO TABS: 10.0000 mg | ORAL_TABLET | Freq: Every day | ORAL | Status: DC

## 2011-08-07 NOTE — Telephone Encounter (Signed)
Done

## 2011-09-26 ENCOUNTER — Other Ambulatory Visit: Payer: Self-pay | Admitting: Neurosurgery

## 2011-09-26 DIAGNOSIS — M47816 Spondylosis without myelopathy or radiculopathy, lumbar region: Secondary | ICD-10-CM

## 2011-09-28 ENCOUNTER — Ambulatory Visit
Admission: RE | Admit: 2011-09-28 | Discharge: 2011-09-28 | Disposition: A | Discharge status: Home / Self Care | Payer: Medicare Other | Source: Ambulatory Visit | Attending: Neurosurgery | Admitting: Neurosurgery

## 2011-09-28 DIAGNOSIS — M47816 Spondylosis without myelopathy or radiculopathy, lumbar region: Secondary | ICD-10-CM

## 2011-09-28 MED ORDER — IOHEXOL 180 MG/ML  SOLN
1.0000 mL | Freq: Once | INTRAMUSCULAR | Status: AC | PRN
  Administered 2011-09-28: 1 mL via EPIDURAL

## 2011-09-28 MED ORDER — METHYLPREDNISOLONE ACETATE 40 MG/ML INJ SUSP (RADIOLOG
120.0000 mg | Freq: Once | INTRAMUSCULAR | Status: AC
  Administered 2011-09-28: 120 mg via EPIDURAL

## 2011-11-27 ENCOUNTER — Ambulatory Visit (INDEPENDENT_AMBULATORY_CARE_PROVIDER_SITE_OTHER): Payer: Medicare Other | Admitting: Family Medicine

## 2011-11-27 ENCOUNTER — Encounter: Payer: Self-pay | Admitting: Family Medicine

## 2011-11-27 DIAGNOSIS — C44601 Unspecified malignant neoplasm of skin of unspecified upper limb, including shoulder: Secondary | ICD-10-CM

## 2011-11-27 DIAGNOSIS — K5901 Slow transit constipation: Secondary | ICD-10-CM | POA: Insufficient documentation

## 2011-11-27 DIAGNOSIS — I10 Essential (primary) hypertension: Secondary | ICD-10-CM

## 2011-11-27 DIAGNOSIS — E119 Type 2 diabetes mellitus without complications: Secondary | ICD-10-CM

## 2011-11-27 DIAGNOSIS — E669 Obesity, unspecified: Secondary | ICD-10-CM | POA: Insufficient documentation

## 2011-11-27 DIAGNOSIS — E1169 Type 2 diabetes mellitus with other specified complication: Secondary | ICD-10-CM | POA: Insufficient documentation

## 2011-11-27 LAB — CBC WITH DIFFERENTIAL/PLATELET
Basophils Absolute: 0 10*3/uL (ref 0.0–0.1)
Basophils Relative: 0.6 % (ref 0.0–3.0)
Eosinophils Absolute: 0.5 10*3/uL (ref 0.0–0.7)
Hemoglobin: 12.9 g/dL — ABNORMAL LOW (ref 13.0–17.0)
Lymphocytes Relative: 29.6 % (ref 12.0–46.0)
Lymphs Abs: 2.2 10*3/uL (ref 0.7–4.0)
MCHC: 34.2 g/dL (ref 30.0–36.0)
MCV: 89.8 fl (ref 78.0–100.0)
Monocytes Absolute: 0.8 10*3/uL (ref 0.1–1.0)
Neutro Abs: 3.9 10*3/uL (ref 1.4–7.7)
RBC: 4.22 Mil/uL (ref 4.22–5.81)
RDW: 14.5 % (ref 11.5–14.6)

## 2011-11-27 LAB — HEMOGLOBIN A1C: Hgb A1c MFr Bld: 6.2 % (ref 4.6–6.5)

## 2011-11-27 LAB — BASIC METABOLIC PANEL
BUN: 31 mg/dL — ABNORMAL HIGH (ref 6–23)
Chloride: 102 mEq/L (ref 96–112)
Creatinine, Ser: 1.7 mg/dL — ABNORMAL HIGH (ref 0.4–1.5)
Glucose, Bld: 91 mg/dL (ref 70–99)

## 2011-11-27 NOTE — Patient Instructions (Signed)
Follow up in 3 months to recheck sugar and cholesterol- don't eat before this appt We'll notify you of your lab results Start Miralax daily to keep things moving Call with any questions or concerns Hang in there!

## 2011-11-27 NOTE — Progress Notes (Signed)
  Subjective:    Patient ID: Shawn Cruz, male    DOB: 06-16-1941, 71 y.o.   MRN: [id number]  HPI Skin Cancer- saw Dr Thomasene Lot (derm) and had skin cancer removed from R forearm.  Started on Imiquimod.  Pt has scab and wants to make sure area looks 'ok'.  Not painful, no drainage.  DM- pt's A1C was 6.7.  Has gained 10 lbs since last visit.  Was not following low carb diet.  Denies CP, SOB, edema, dizziness, shaking.  Constipation- stools are 'big and hard'.  sxs started 3 months ago.  Stools are not regular- will go every few days.  Drinking water regularly.   HTN- chronic problem, well controlled today.  On Lasix.  Not on ACE or ARB.  Denies CP, SOB, HAs, visual changes, edema.    Review of Systems For ROS see HPI     Objective:   Physical Exam  Vitals reviewed. Constitutional: He is oriented to person, place, and time. He appears well-developed and well-nourished. No distress.  HENT:  Head: Normocephalic and atraumatic.  Eyes: Conjunctivae and EOM are normal. Pupils are equal, round, and reactive to light.  Neck: Normal range of motion. Neck supple. No thyromegaly present.  Cardiovascular: Normal rate, regular rhythm, normal heart sounds and intact distal pulses.   No murmur heard. Pulmonary/Chest: Effort normal and breath sounds normal. No respiratory distress.  Abdominal: Soft. Bowel sounds are normal. He exhibits no distension. There is no tenderness. There is no rebound and no guarding.  Musculoskeletal: He exhibits no edema.  Lymphadenopathy:    He has no cervical adenopathy.  Neurological: He is alert and oriented to person, place, and time. No cranial nerve deficit.  Skin: Skin is warm and dry.       Well healing biopsy site- pink in color w/ scabbing.  No induration, erythema, or drainage.  Psychiatric: He has a normal mood and affect. His behavior is normal.          Assessment & Plan:

## 2011-12-04 NOTE — Assessment & Plan Note (Signed)
Recent surgical site is well healing w/out evidence of infxn.  Continue imiqumod as directed by derm.

## 2011-12-04 NOTE — Assessment & Plan Note (Signed)
Chronic problem.  Currently well controlled.  Asymptomatic.  No changes. 

## 2011-12-04 NOTE — Assessment & Plan Note (Signed)
New.  Reviewed diet and lifestyle modifications.  Start miralax daily.  Pt to call if sxs don't improve.

## 2011-12-04 NOTE — Assessment & Plan Note (Signed)
Newly dx'd for pt.  Attempting to control w/ diet and exercise.  Not currently on meds.  UTD on eye exam.  Recheck labs- start meds prn.  Reviewed ADA diet and importance of regular exercise.

## 2012-01-17 ENCOUNTER — Encounter: Payer: Self-pay | Admitting: Family Medicine

## 2012-01-17 ENCOUNTER — Ambulatory Visit (INDEPENDENT_AMBULATORY_CARE_PROVIDER_SITE_OTHER): Payer: Medicare Other | Admitting: Family Medicine

## 2012-01-17 VITALS — BP 127/82 | HR 68 | Temp 99.0°F | Ht 67.25 in | Wt 222.8 lb

## 2012-01-17 DIAGNOSIS — J111 Influenza due to unidentified influenza virus with other respiratory manifestations: Secondary | ICD-10-CM

## 2012-01-17 MED ORDER — OSELTAMIVIR PHOSPHATE 75 MG PO CAPS: 75.0000 mg | ORAL_CAPSULE | Freq: Two times a day (BID) | ORAL | Status: AC

## 2012-01-17 NOTE — Assessment & Plan Note (Signed)
New.  Pt's abrupt onset of sxs, body aches, fever and cough consistent w/ flu.  Start Tamiflu.  Reviewed supportive care and red flags that should prompt return.  Pt expressed understanding and is in agreement w/ plan.

## 2012-01-17 NOTE — Patient Instructions (Signed)
This appears to be the flu Start the Tamiflu- twice daily Alternate tylenol and ibuprofen for the body aches Robitussin as needed for cough If your cough should change or worsen- call and we'll get a chest xray REST! Drink plenty of fluids Hang in there!!!

## 2012-01-17 NOTE — Progress Notes (Signed)
  Subjective:    Patient ID: Shawn Cruz, male    DOB: Mar 15, 1941, 71 y.o.   MRN: [id number]  HPI URI- sxs started yesterday morning.  'felt like i got hit by a truck'.  + chills, body aches, skin hurts.  Denies facial pain, ear pain.  + cough- deep but not productive.  No known sick contacts.   Review of Systems For ROS see HPI     Objective:   Physical Exam  Constitutional: He appears well-developed and well-nourished. No distress.  HENT:  Head: Normocephalic and atraumatic.  Right Ear: Tympanic membrane normal.  Left Ear: Tympanic membrane normal.  Nose: No mucosal edema or rhinorrhea. Right sinus exhibits no maxillary sinus tenderness and no frontal sinus tenderness. Left sinus exhibits no maxillary sinus tenderness and no frontal sinus tenderness.  Mouth/Throat: Mucous membranes are normal. No oropharyngeal exudate, posterior oropharyngeal edema or posterior oropharyngeal erythema.  Eyes: Conjunctivae and EOM are normal. Pupils are equal, round, and reactive to light.  Neck: Normal range of motion. Neck supple.  Cardiovascular: Normal rate, regular rhythm and normal heart sounds.   Pulmonary/Chest: Effort normal and breath sounds normal. No respiratory distress. He has no wheezes.       + hacking cough  Lymphadenopathy:    He has no cervical adenopathy.  Skin: Skin is warm and dry.          Assessment & Plan:

## 2012-01-21 ENCOUNTER — Ambulatory Visit (INDEPENDENT_AMBULATORY_CARE_PROVIDER_SITE_OTHER): Payer: Medicare Other | Admitting: Internal Medicine

## 2012-01-21 ENCOUNTER — Encounter: Payer: Self-pay | Admitting: Internal Medicine

## 2012-01-21 ENCOUNTER — Ambulatory Visit: Payer: Medicare Other | Admitting: Internal Medicine

## 2012-01-21 ENCOUNTER — Telehealth: Payer: Self-pay | Admitting: Family Medicine

## 2012-01-21 VITALS — BP 120/74 | HR 69 | Temp 98.4°F | Resp 20

## 2012-01-21 DIAGNOSIS — J40 Bronchitis, not specified as acute or chronic: Secondary | ICD-10-CM | POA: Insufficient documentation

## 2012-01-21 MED ORDER — DOXYCYCLINE HYCLATE 100 MG PO TABS: 100.0000 mg | ORAL_TABLET | Freq: Two times a day (BID) | ORAL | Status: AC

## 2012-01-21 MED ORDER — HYDROCOD POLST-CHLORPHEN POLST 10-8 MG/5ML PO LQCR: 5.0000 mL | Freq: Two times a day (BID) | ORAL | Status: DC | PRN

## 2012-01-21 NOTE — Assessment & Plan Note (Signed)
Concern for developing bacterial bronchitis. Begin doxycycline x7days. Attempt tussionex prn cough- cautioned regarding possible sedating effect.

## 2012-01-21 NOTE — Telephone Encounter (Signed)
[facility] Triage Call Report Triage Record Num: [id number] Operator: Chevis Pretty Patient Name: Shawn Cruz Call Date & Time: 01/21/2012 1:38:37PM Patient Phone: [phone] PCP: Lezlie Octave Patient Gender: Male PCP Fax : [phone] Patient DOB: [birth year] Practice Name: [facility] Reason for Call: Caller: Marilyn/Patient; PCP: Sheliah Hatch.; CB#: [phone]; ; ; Call regarding Worried About Pneumonia; Flu Positive; was told to call if cough worsened or did not improve, and an xray would be ordered. Seen in office 01/17/12 and took tamiflu. States body aches are improved, but cough is not. States has history of pneumonia x 2 after illness in the past. Afebrile as of 01/20/12. Per protocol, emergent symptoms denied; advised appt wihtin 24 hours. Appt sched 01/21/12 1600 in [facility] office. Protocol(s) Used: Cough - Adult Recommended Outcome per Protocol: See Provider within 24 hours Reason for Outcome: Productive cough with colored sputum (other than clear or white sputum) Care Advice: ~ Use a cool mist humidifier to moisten air. Be sure to clean according to manufacturer's instructions. Limit or avoid exposure to irritants and allergens (e.g. air pollution, smoke/smoking, chemicals, dust, pollen, pet dander, etc.) ~ Call provider if fever greater than 101.5 F (38.6 C) or 100.5 F (38.1C) in an immunocompromised patient (such as diabetes, HIV/AIDS, renal disease, chemotherapy, organ transplant, or chronic steroid use) has not improved in 24 hours. ~ Increase fluids to 8-12 eight oz (1.6 to 2.4 liters) glasses per day, half of them to be water. Soups, popsicles, fruit juices, non-caffeinated sodas (unless restricting sodium intake), jello, broths, decaf teas, etc. are all okay. Warm fluids can be soothing. ~ ~ If you can, stop smoking now and avoid all secondhand smoke. ~ HEALTH PROMOTION / MAINTENANCE ~ SYMPTOM / CONDITION MANAGEMENT ~  CAUTIONS Coughing up mucus or phlegm helps to get rid of an infection. A productive cough should not be stopped. A cough medicine with guaifenesin (Robitussin, Mucinex) can help loosen the mucus. Cough medicine with dextromethorphan (DM) should be avoided. Drinking lots of fluids can help loosen the mucus too, especially warm fluids. ~ 01/21/2012 1:53:10PM

## 2012-01-21 NOTE — Telephone Encounter (Signed)
Refill for  Furosemide 40 MG tablet  Qty 90 Take 1-tablet by mouth once daily Last fill date 12.18.12

## 2012-01-21 NOTE — Progress Notes (Signed)
  Subjective:    Patient ID: Shawn Cruz, male    DOB: May 21, 1941, 71 y.o.   MRN: [id number]  HPI Pt presents to clinic for evaluation of cough. States seen last week for URI and felt to clinically have flu and has one day remaining of tamiflu. States all sx's except cough have improved. Cough remains persistent and now productive for yellow dark sputum without hemoptysis. Is taking otc mucinex but cough interferes with sleep. Reviewed h/o CRI with typical Cr range 1.7-1.9. No other complaints.  Past Medical History  Diagnosis Date  . DDD (degenerative disc disease)     LS spine  . Hyperglycemia   . Hypertension   . Chronic kidney disease    Past Surgical History  Procedure Date  . Lumbar discetomy     1977  . Rotator cuff repair     Dr. Cleophas Dunker    reports that he has quit smoking. He does not have any smokeless tobacco history on file. He reports that he drinks alcohol. He reports that he does not use illicit drugs. family history includes Aortic aneurysm in his sister; Heart attack in his father and mother; and Hypertension in his father and mother. Allergies  Allergen Reactions  . Sulfonamide Derivatives     REACTION: Age 56, leg swelling     Review of Systems see hpi     Objective:   Physical Exam  Nursing note and vitals reviewed. Constitutional: He appears well-developed and well-nourished. No distress.  HENT:  Head: Normocephalic and atraumatic.  Right Ear: External ear normal.  Left Ear: External ear normal.  Cardiovascular: Normal rate, regular rhythm and normal heart sounds.   Pulmonary/Chest: Effort normal and breath sounds normal. No respiratory distress. He has no wheezes. He has no rales.  Neurological: He is alert.  Skin: Skin is warm and dry. He is not diaphoretic.  Psychiatric: He has a normal mood and affect.          Assessment & Plan:

## 2012-01-22 ENCOUNTER — Telehealth: Payer: Self-pay

## 2012-01-22 NOTE — Telephone Encounter (Signed)
Appt sched 01/21/12 1600 in [facility] office

## 2012-01-22 NOTE — Telephone Encounter (Signed)
Message left on Triage voicemail: Patient would like a rx for gout medication.  I reviewed medication list, no active gout med on list. Last Uric Acid level was 10.9 on 07/19/2011. Please advise if gout med to be rx'ed or if patient needs OV.

## 2012-01-22 NOTE — Telephone Encounter (Signed)
Pt advise of Dr Alwyn Ren suggestion Pt state that he has a pending OV with Dr Caryn Section in AM who has already drawn his blood for this so he will wait until this appt . Pt decline to have med sent or lab work done at this time.

## 2012-01-22 NOTE — Telephone Encounter (Signed)
Indomethacin 50 mg 3 times a day with meals as needed, #21. Uric acid level needed (274.9)with F/U OV

## 2012-01-28 ENCOUNTER — Other Ambulatory Visit: Payer: Self-pay | Admitting: *Deleted

## 2012-01-28 DIAGNOSIS — N19 Unspecified kidney failure: Secondary | ICD-10-CM

## 2012-01-28 DIAGNOSIS — E875 Hyperkalemia: Secondary | ICD-10-CM

## 2012-01-28 MED ORDER — FUROSEMIDE 40 MG PO TABS: 40.0000 mg | ORAL_TABLET | Freq: Every day | ORAL | Status: DC

## 2012-01-28 NOTE — Telephone Encounter (Signed)
Rx sent 

## 2012-02-05 ENCOUNTER — Other Ambulatory Visit: Payer: Self-pay | Admitting: Family Medicine

## 2012-02-05 MED ORDER — CITALOPRAM HYDROBROMIDE 20 MG PO TABS: 20.0000 mg | ORAL_TABLET | Freq: Every day | ORAL | Status: DC

## 2012-02-05 NOTE — Telephone Encounter (Signed)
rx sent to pharmacy by e-script  

## 2012-02-05 NOTE — Telephone Encounter (Signed)
Refill for   Citalopram Qty 90 Take 1-Tablet by mouth daily  Last filled 1.3.13

## 2012-02-15 ENCOUNTER — Other Ambulatory Visit: Payer: Self-pay | Admitting: Neurosurgery

## 2012-02-15 DIAGNOSIS — M47817 Spondylosis without myelopathy or radiculopathy, lumbosacral region: Secondary | ICD-10-CM

## 2012-02-18 ENCOUNTER — Other Ambulatory Visit: Payer: Self-pay | Admitting: Neurosurgery

## 2012-02-18 ENCOUNTER — Ambulatory Visit
Admission: RE | Admit: 2012-02-18 | Discharge: 2012-02-18 | Disposition: A | Discharge status: Home / Self Care | Payer: Medicare Other | Source: Ambulatory Visit | Attending: Neurosurgery | Admitting: Neurosurgery

## 2012-02-18 DIAGNOSIS — M47817 Spondylosis without myelopathy or radiculopathy, lumbosacral region: Secondary | ICD-10-CM

## 2012-02-21 ENCOUNTER — Ambulatory Visit
Admission: RE | Admit: 2012-02-21 | Discharge: 2012-02-21 | Disposition: A | Discharge status: Home / Self Care | Payer: Medicare Other | Source: Ambulatory Visit | Attending: Neurosurgery | Admitting: Neurosurgery

## 2012-02-21 DIAGNOSIS — M47817 Spondylosis without myelopathy or radiculopathy, lumbosacral region: Secondary | ICD-10-CM

## 2012-02-25 ENCOUNTER — Ambulatory Visit (INDEPENDENT_AMBULATORY_CARE_PROVIDER_SITE_OTHER): Payer: Medicare Other | Admitting: Family Medicine

## 2012-02-25 ENCOUNTER — Encounter: Payer: Self-pay | Admitting: Family Medicine

## 2012-02-25 VITALS — BP 122/75 | HR 62 | Temp 98.0°F | Ht 67.0 in | Wt 224.2 lb

## 2012-02-25 DIAGNOSIS — I10 Essential (primary) hypertension: Secondary | ICD-10-CM

## 2012-02-25 DIAGNOSIS — E119 Type 2 diabetes mellitus without complications: Secondary | ICD-10-CM

## 2012-02-25 DIAGNOSIS — E785 Hyperlipidemia, unspecified: Secondary | ICD-10-CM

## 2012-02-25 DIAGNOSIS — E875 Hyperkalemia: Secondary | ICD-10-CM

## 2012-02-25 DIAGNOSIS — E669 Obesity, unspecified: Secondary | ICD-10-CM

## 2012-02-25 DIAGNOSIS — N19 Unspecified kidney failure: Secondary | ICD-10-CM

## 2012-02-25 LAB — LIPID PANEL
Cholesterol: 120 mg/dL (ref 0–200)
HDL: 40.3 mg/dL (ref >39.00)
LDL Cholesterol: 41 mg/dL (ref 0–99)
Total CHOL/HDL Ratio: 3
Triglycerides: 192 mg/dL — ABNORMAL HIGH (ref 0.0–149.0)

## 2012-02-25 LAB — POCT URINALYSIS DIPSTICK
Blood, UA: NEGATIVE
Ketones, UA: NEGATIVE
Protein, UA: NEGATIVE
Spec Grav, UA: 1.01
Urobilinogen, UA: 0.2

## 2012-02-25 LAB — HEPATIC FUNCTION PANEL
ALT: 20 U/L (ref 0–53)
Bilirubin, Direct: 0 mg/dL (ref 0.0–0.3)
Total Bilirubin: 0.3 mg/dL (ref 0.3–1.2)

## 2012-02-25 LAB — BASIC METABOLIC PANEL
BUN: 37 mg/dL — ABNORMAL HIGH (ref 6–23)
Calcium: 9.4 mg/dL (ref 8.4–10.5)
Chloride: 107 mEq/L (ref 96–112)
Creatinine, Ser: 1.8 mg/dL — ABNORMAL HIGH (ref 0.4–1.5)
GFR: 40.44 mL/min — ABNORMAL LOW (ref >60.00)

## 2012-02-25 MED ORDER — FUROSEMIDE 40 MG PO TABS: 40.0000 mg | ORAL_TABLET | Freq: Two times a day (BID) | ORAL | Status: DC

## 2012-02-25 NOTE — Patient Instructions (Signed)
Follow up in 3 months to recheck diabetes Increase Lasix to 1 tab twice daily if needed for swelling If you develop leg cramps- this is a sign of low K+ and increase the amount in your diet w/ bananas, citrus fruits, leafy greens You look great!  Hang in there!! Keep up the good work!

## 2012-02-25 NOTE — Progress Notes (Signed)
  Subjective:    Patient ID: Shawn Cruz, male    DOB: 10/20/1941, 71 y.o.   MRN: [id number]  HPI DM- chronic problem, not on meds.  Has eye exam scheduled for tomorrow.  Unable to exercise due to back pain (seeing Dr Channing Mutters)  HTN- chronic problem, excellent control on Lasix.  Denies CP, SOB, HAs, visual changes, edema.  Hyperlipidemia- chronic problem, on Crestor.  Due for labs today.  No abd pain, N/V, myalgias.   Review of Systems For ROS see HPI     Objective:   Physical Exam  Vitals reviewed. Constitutional: He is oriented to person, place, and time. He appears well-developed and well-nourished. No distress.  HENT:  Head: Normocephalic and atraumatic.  Eyes: Conjunctivae and EOM are normal. Pupils are equal, round, and reactive to light.  Neck: Normal range of motion. Neck supple. No thyromegaly present.  Cardiovascular: Normal rate, regular rhythm, normal heart sounds and intact distal pulses.   No murmur heard. Pulmonary/Chest: Effort normal and breath sounds normal. No respiratory distress.  Abdominal: Soft. Bowel sounds are normal. He exhibits no distension.  Musculoskeletal: He exhibits no edema.  Lymphadenopathy:    He has no cervical adenopathy.  Neurological: He is alert and oriented to person, place, and time. No cranial nerve deficit.  Skin: Skin is warm and dry.  Psychiatric: He has a normal mood and affect. His behavior is normal.          Assessment & Plan:

## 2012-02-26 LAB — MICROALBUMIN / CREATININE URINE RATIO
Creatinine,U: 49.1 mg/dL
Microalb, Ur: 0.4 mg/dL (ref 0.0–1.9)

## 2012-02-26 NOTE — Assessment & Plan Note (Addendum)
Chronic problem.  Typically well controlled.  UTD on eye exam.  Asymptomatic.  Check labs.  Adjust meds prn  

## 2012-02-27 ENCOUNTER — Encounter: Payer: Self-pay | Admitting: *Deleted

## 2012-02-28 NOTE — Assessment & Plan Note (Signed)
Chronic problem.  Good control.  Asymptomatic.  No changes.

## 2012-02-28 NOTE — Assessment & Plan Note (Signed)
Chronic problem, tolerating statin w/out difficulty.  Check labs.  Adjust meds prn  

## 2012-03-19 ENCOUNTER — Other Ambulatory Visit: Payer: Self-pay | Admitting: Neurosurgery

## 2012-03-19 DIAGNOSIS — M47816 Spondylosis without myelopathy or radiculopathy, lumbar region: Secondary | ICD-10-CM

## 2012-03-20 ENCOUNTER — Ambulatory Visit
Admission: RE | Admit: 2012-03-20 | Discharge: 2012-03-20 | Disposition: A | Discharge status: Home / Self Care | Payer: Medicare Other | Source: Ambulatory Visit | Attending: Neurosurgery | Admitting: Neurosurgery

## 2012-03-20 VITALS — BP 142/75 | HR 51

## 2012-03-20 DIAGNOSIS — M47816 Spondylosis without myelopathy or radiculopathy, lumbar region: Secondary | ICD-10-CM

## 2012-03-20 MED ORDER — METHYLPREDNISOLONE ACETATE 40 MG/ML INJ SUSP (RADIOLOG
120.0000 mg | Freq: Once | INTRAMUSCULAR | Status: AC
  Administered 2012-03-20: 120 mg via INTRA_ARTICULAR

## 2012-03-20 MED ORDER — IOHEXOL 180 MG/ML  SOLN
1.0000 mL | Freq: Once | INTRAMUSCULAR | Status: AC | PRN
  Administered 2012-03-20: 1 mL via INTRA_ARTICULAR

## 2012-03-20 NOTE — Discharge Instructions (Signed)

## 2012-03-21 ENCOUNTER — Other Ambulatory Visit: Payer: Medicare Other

## 2012-04-18 ENCOUNTER — Other Ambulatory Visit: Payer: Self-pay | Admitting: Family Medicine

## 2012-04-18 MED ORDER — ROSUVASTATIN CALCIUM 10 MG PO TABS: 10.0000 mg | ORAL_TABLET | Freq: Every day | ORAL | Status: DC

## 2012-04-18 NOTE — Telephone Encounter (Signed)
Refill done.  

## 2012-04-18 NOTE — Telephone Encounter (Signed)
refill crestor 10mg  tablet #30 Take one tablet by mouth daily  Last fill 5.8.13 Last ov 4.23.13

## 2012-05-06 ENCOUNTER — Telehealth: Payer: Self-pay | Admitting: Family Medicine

## 2012-05-06 MED ORDER — CITALOPRAM HYDROBROMIDE 20 MG PO TABS: 20.0000 mg | ORAL_TABLET | Freq: Every day | ORAL | Status: DC

## 2012-05-06 NOTE — Telephone Encounter (Signed)
Refill: Citalopram hbr 20 mg tablet. Take 1 tablet by mouth daily.  Qty 90.

## 2012-05-06 NOTE — Telephone Encounter (Signed)
rx sent to pharmacy by e-script  

## 2012-08-15 ENCOUNTER — Telehealth: Payer: Self-pay | Admitting: Family Medicine

## 2012-08-15 NOTE — Telephone Encounter (Deleted)
Caller: Janice/Patient; Patient Name: Shawn Cruz; PCP: Sheliah Hatch.; Best Callback Phone Number: [phone]. (1) She states that she and her husband need the Pneumonia shot.  She is calling to schedule appointment for her and her husband.  (2)  She is taking synthroid and it is going to be costing her $65.00 this coming month.  Is there an alternative that she can take?   Advised I would forward concerns and appointment request to the office. Understanding expressed.

## 2012-08-15 NOTE — Telephone Encounter (Signed)
Caller: Janice/Spouse ; Patient Name: Shawn Cruz; PCP: Sheliah Hatch.; Best Callback Phone Number: [phone].Wife is calling ,  she and her husband need the Pneumonia shot.  She is calling to schedule appointment for her and her husband at the office.  He will be having back surgery and his neurologist recommended that he have one.  PLEASE CONTACT PATIENT FOR APPOINTMENT.

## 2012-08-19 NOTE — Telephone Encounter (Signed)
Coming  in Friday at 315pm

## 2012-08-19 NOTE — Telephone Encounter (Signed)
Please call pt and wife to schedule a Pneumonia shot, will send message with wife request as well, thank you

## 2012-08-20 ENCOUNTER — Ambulatory Visit (INDEPENDENT_AMBULATORY_CARE_PROVIDER_SITE_OTHER): Payer: Medicare Other | Admitting: Emergency Medicine

## 2012-08-20 VITALS — BP 112/70 | HR 59 | Temp 97.9°F | Resp 18 | Ht 68.0 in | Wt 213.0 lb

## 2012-08-20 DIAGNOSIS — M109 Gout, unspecified: Secondary | ICD-10-CM

## 2012-08-20 MED ORDER — INDOMETHACIN 25 MG PO CAPS: 25.0000 mg | ORAL_CAPSULE | Freq: Two times a day (BID) | ORAL | Status: DC

## 2012-08-20 MED ORDER — COLCHICINE 0.6 MG PO TABS: ORAL_TABLET | ORAL | Status: DC

## 2012-08-20 NOTE — Progress Notes (Signed)
[facility] [address], [place] [address] [phone]  Date:  08/20/2012   Name:  Shawn Cruz   DOB:  1941-02-16   MRN:  [id number]  PCP:  Neena Rhymes, MD    Chief Complaint: Foot Pain   History of Present Illness:  Shawn Cruz is a 71 y.o. very pleasant male patient who presents with the following:  History of gout, predominantly in first MTP joint on foot.  Now has pain in second and third left MTP joints with swelling in joints, marked tenderness and some redness.  No history of trauma or overuse.  Has been present since one week or more.  No improvement with taking 3 days of colcrys.  Patient Active Problem List  Diagnosis  . HYPERLIPIDEMIA  . RESTLESS LEG SYNDROME  . HYPERTENSION  . DEGENERATIVE DISC DISEASE, LUMBOSACRAL SPINE  . MUSCLE PAIN  . ANEMIA  . RENAL INSUFFICIENCY  . PRESSURE ULCER OTHER SITE  . Hyperglycemia  . Gout  . Polyphagia  . Skin cancer of arm  . Slow transit constipation  . Diabetes mellitus type 2 in obese  . Influenza-like illness  . Bronchitis    Past Medical History  Diagnosis Date  . DDD (degenerative disc disease)     LS spine  . Hyperglycemia   . Hypertension   . Chronic kidney disease   . Diabetes mellitus     Past Surgical History  Procedure Date  . Lumbar discetomy     1977  . Rotator cuff repair     Dr. Cleophas Dunker    History  Substance Use Topics  . Smoking status: Former Games developer  . Smokeless tobacco: Not on file   Comment: Quit at age 68  . Alcohol Use: Yes     occass    Family History  Problem Relation Age of Onset  . Hypertension Mother   . Heart attack Mother     age 54's  . Hypertension Father   . Heart attack Father     age 35's  . Aortic aneurysm Sister     Allergies  Allergen Reactions  . Sulfonamide Derivatives     REACTION: Age 26, leg swelling    Medication list has been reviewed and updated.  Current Outpatient Prescriptions on File Prior to  Visit  Medication Sig Dispense Refill  . aspirin 81 MG tablet Take 81 mg by mouth daily.        . citalopram (CELEXA) 20 MG tablet Take 1 tablet (20 mg total) by mouth daily.  90 tablet  1  . colchicine 0.6 MG tablet Take 0.6 mg by mouth daily.      . furosemide (LASIX) 40 MG tablet Take 1 tablet (40 mg total) by mouth 2 (two) times daily.  180 tablet  1  . hydrOXYzine (ATARAX/VISTARIL) 25 MG tablet       . oxyCODONE-acetaminophen (PERCOCET) 10-325 MG per tablet       . polyethylene glycol (MIRALAX / GLYCOLAX) packet Take 17 g by mouth daily.      . rosuvastatin (CRESTOR) 10 MG tablet Take 1 tablet (10 mg total) by mouth daily.  30 tablet  5  . chlorpheniramine-HYDROcodone (TUSSIONEX PENNKINETIC ER) 10-8 MG/5ML LQCR Take 5 mLs by mouth every 12 (twelve) hours as needed.  140 mL  0  . Cholecalciferol (VITAMIN D) 2000 UNITS CAPS Take by mouth daily.        [name] HYDROcodone-acetaminophen (NORCO) 10-325 MG per tablet Take  1 tablet by mouth every 4 (four) hours as needed. No more than 6 daily as needed       . imiquimod (ALDARA) 5 % cream       . TAMIFLU 75 MG capsule         Review of Systems:  As per HPI, otherwise negative.    Physical Examination: Filed Vitals:   08/20/12 1509  BP: 112/70  Pulse: 59  Temp: 97.9 F (36.6 C)  Resp: 18   Filed Vitals:   08/20/12 1509  Height: 5\' 8"  (1.727 m)  Weight: 213 lb (96.616 kg)   Body mass index is 32.39 kg/(m^2). Ideal Body Weight: Weight in (lb) to have BMI = 25: 164.1    GEN: WDWN, NAD, Non-toxic, Alert & Oriented x 3 HEENT: Atraumatic, Normocephalic.  Ears and Nose: No external deformity. EXTR: No clubbing/cyanosis/edema NEURO: Normal gait.  PSYCH: Normally interactive. Conversant. Not depressed or anxious appearing.  Calm demeanor.  FOOT: marked tenderness 2nd and 3rd MTP joint left foot  Assessment and Plan: Gout colcrys indocin  Carmelina Dane, MD  I have reviewed and agree with documentation. Robert P. Merla Riches,  M.D.

## 2012-08-22 ENCOUNTER — Ambulatory Visit (INDEPENDENT_AMBULATORY_CARE_PROVIDER_SITE_OTHER): Payer: Medicare Other

## 2012-08-22 DIAGNOSIS — Z299 Encounter for prophylactic measures, unspecified: Secondary | ICD-10-CM

## 2012-08-22 DIAGNOSIS — Z23 Encounter for immunization: Secondary | ICD-10-CM

## 2012-10-03 ENCOUNTER — Ambulatory Visit (INDEPENDENT_AMBULATORY_CARE_PROVIDER_SITE_OTHER): Payer: Medicare Other | Admitting: Emergency Medicine

## 2012-10-03 VITALS — BP 128/76 | HR 58 | Temp 98.2°F | Resp 16 | Ht 68.0 in | Wt 209.0 lb

## 2012-10-03 DIAGNOSIS — M109 Gout, unspecified: Secondary | ICD-10-CM

## 2012-10-03 NOTE — Patient Instructions (Addendum)
Gout Gout is an inflammatory condition (arthritis) caused by a buildup of uric acid crystals in the joints. Uric acid is a chemical that is normally present in the blood. Under some circumstances, uric acid can form into crystals in your joints. This causes joint redness, soreness, and swelling (inflammation). Repeat attacks are common. Over time, uric acid crystals can form into masses (tophi) near a joint, causing disfigurement. Gout is treatable and often preventable. CAUSES  The disease begins with elevated levels of uric acid in the blood. Uric acid is produced by your body when it breaks down a naturally found substance called purines. This also happens when you eat certain foods such as meats and fish. Causes of an elevated uric acid level include:  Being passed down from parent to child (heredity).  Diseases that cause increased uric acid production (obesity, psoriasis, some cancers).  Excessive alcohol use.  Diet, especially diets rich in meat and seafood.  Medicines, including certain cancer-fighting drugs (chemotherapy), diuretics, and aspirin.  Chronic kidney disease. The kidneys are no longer able to remove uric acid well.  Problems with metabolism. Conditions strongly associated with gout include:  Obesity.  High blood pressure.  High cholesterol.  Diabetes. Not everyone with elevated uric acid levels gets gout. It is not understood why some people get gout and others do not. Surgery, joint injury, and eating too much of certain foods are some of the factors that can lead to gout. SYMPTOMS   An attack of gout comes on quickly. It causes intense pain with redness, swelling, and warmth in a joint.  Fever can occur.  Often, only one joint is involved. Certain joints are more commonly involved:  Base of the big toe.  Knee.  Ankle.  Wrist.  Finger. Without treatment, an attack usually goes away in a few days to weeks. Between attacks, you usually will not have  symptoms, which is different from many other forms of arthritis. DIAGNOSIS  Your caregiver will suspect gout based on your symptoms and exam. Removal of fluid from the joint (arthrocentesis) is done to check for uric acid crystals. Your caregiver will give you a medicine that numbs the area (local anesthetic) and use a needle to remove joint fluid for exam. Gout is confirmed when uric acid crystals are seen in joint fluid, using a special microscope. Sometimes, blood, urine, and X-ray tests are also used. TREATMENT  There are 2 phases to gout treatment: treating the sudden onset (acute) attack and preventing attacks (prophylaxis). Treatment of an Acute Attack  Medicines are used. These include anti-inflammatory medicines or steroid medicines.  An injection of steroid medicine into the affected joint is sometimes necessary.  The painful joint is rested. Movement can worsen the arthritis.  You may use warm or cold treatments on painful joints, depending which works best for you.  Discuss the use of coffee, vitamin C, or cherries with your caregiver. These may be helpful treatment options. Treatment to Prevent Attacks After the acute attack subsides, your caregiver may advise prophylactic medicine. These medicines either help your kidneys eliminate uric acid from your body or decrease your uric acid production. You may need to stay on these medicines for a very long time. The early phase of treatment with prophylactic medicine can be associated with an increase in acute gout attacks. For this reason, during the first few months of treatment, your caregiver may also advise you to take medicines usually used for acute gout treatment. Be sure you understand your caregiver's directions.   You should also discuss dietary treatment with your caregiver. Certain foods such as meats and fish can increase uric acid levels. Other foods such as dairy can decrease levels. Your caregiver can give you a list of foods  to avoid. HOME CARE INSTRUCTIONS   Do not take aspirin to relieve pain. This raises uric acid levels.  Only take over-the-counter or prescription medicines for pain, discomfort, or fever as directed by your caregiver.  Rest the joint as much as possible. When in bed, keep sheets and blankets off painful areas.  Keep the affected joint raised (elevated).  Use crutches if the painful joint is in your leg.  Drink enough water and fluids to keep your urine clear or pale yellow. This helps your body get rid of uric acid. Do not drink alcoholic beverages. They slow the passage of uric acid.  Follow your caregiver's dietary instructions. Pay careful attention to the amount of protein you eat. Your daily diet should emphasize fruits, vegetables, whole grains, and fat-free or low-fat milk products.  Maintain a healthy body weight. SEEK MEDICAL CARE IF:   You have an oral temperature above 102 F (38.9 C).  You develop diarrhea, vomiting, or any side effects from medicines.  You do not feel better in 24 hours, or you are getting worse. SEEK IMMEDIATE MEDICAL CARE IF:   Your joint becomes suddenly more tender and you have:  Chills.  An oral temperature above 102 F (38.9 C), not controlled by medicine. MAKE SURE YOU:   Understand these instructions.  Will watch your condition.  Will get help right away if you are not doing well or get worse. Document Released: 10/19/2000 Document Revised: 01/14/2012 Document Reviewed: 01/30/2010 Exit[employer] Patient Information 2013 [employer].    

## 2012-10-03 NOTE — Progress Notes (Signed)
[facility] [address], [place] [address] [phone]  Date:  10/03/2012   Name:  Shawn Cruz   DOB:  [birth year]   MRN:  [id number]  PCP:  Neena Rhymes, MD    Chief Complaint: Hand Pain   History of Present Illness:  Shawn Cruz is a 71 y.o. very pleasant male patient who presents with the following:  Sudden onset of pain this morning in right thumb joint.  History of gout.  Says MCP joint swollen and red and exquisitely painful and tender.  No history or tenderness or overuse.  No fever or chills.  Has taken no medication.  No improvement in pain with oxycodone.  Patient Active Problem List  Diagnosis  . HYPERLIPIDEMIA  . RESTLESS LEG SYNDROME  . HYPERTENSION  . DEGENERATIVE DISC DISEASE, LUMBOSACRAL SPINE  . MUSCLE PAIN  . ANEMIA  . RENAL INSUFFICIENCY  . PRESSURE ULCER OTHER SITE  . Hyperglycemia  . Gout  . Polyphagia  . Skin cancer of arm  . Slow transit constipation  . Diabetes mellitus type 2 in obese  . Influenza-like illness  . Bronchitis    Past Medical History  Diagnosis Date  . DDD (degenerative disc disease)     LS spine  . Hyperglycemia   . Hypertension   . Chronic kidney disease   . Diabetes mellitus     Past Surgical History  Procedure Date  . Lumbar discetomy     1977  . Rotator cuff repair     Dr. Cleophas Dunker    History  Substance Use Topics  . Smoking status: Former Games developer  . Smokeless tobacco: Not on file     Comment: Quit at age 8  . Alcohol Use: Yes     Comment: occass    Family History  Problem Relation Age of Onset  . Hypertension Mother   . Heart attack Mother     age 58's  . Hypertension Father   . Heart attack Father     age 110's  . Aortic aneurysm Sister     Allergies  Allergen Reactions  . Sulfonamide Derivatives     REACTION: Age 49, leg swelling    Medication list has been reviewed and updated.  Current Outpatient Prescriptions on File Prior to Visit  Medication  Sig Dispense Refill  . aspirin 81 MG tablet Take 81 mg by mouth daily.        . citalopram (CELEXA) 20 MG tablet Take 1 tablet (20 mg total) by mouth daily.  90 tablet  1  . colchicine 0.6 MG tablet 2 tablets now and one in one hour.  Tomorrow take one daily  30 tablet  0  . furosemide (LASIX) 40 MG tablet Take 1 tablet (40 mg total) by mouth 2 (two) times daily.  180 tablet  1  . HYDROcodone-acetaminophen (NORCO) 10-325 MG per tablet Take 1 tablet by mouth every 4 (four) hours as needed. No more than 6 daily as needed       . hydrOXYzine (ATARAX/VISTARIL) 25 MG tablet 10 mg.       . polyethylene glycol (MIRALAX / GLYCOLAX) packet Take 17 g by mouth daily.      . rosuvastatin (CRESTOR) 10 MG tablet Take 1 tablet (10 mg total) by mouth daily.  30 tablet  5  . Cholecalciferol (VITAMIN D) 2000 UNITS CAPS Take by mouth daily.        . indomethacin (INDOCIN) 25 MG capsule Take  1 capsule (25 mg total) by mouth 2 (two) times daily with a meal.  40 capsule  0  . [DISCONTINUED] colchicine 0.6 MG tablet Take 0.6 mg by mouth daily.        Review of Systems:  As per HPI, otherwise negative.    Physical Examination: Filed Vitals:   10/03/12 1001  BP: 128/76  Pulse: 58  Temp: 98.2 F (36.8 C)  Resp: 16   Filed Vitals:   10/03/12 1001  Height: 5\' 8"  (1.727 m)  Weight: 209 lb (94.802 kg)   Body mass index is 31.78 kg/(m^2). Ideal Body Weight: Weight in (lb) to have BMI = 25: 164.1    GEN: WDWN, NAD, Non-toxic, Alert & Oriented x 3 HEENT: Atraumatic, Normocephalic.  Ears and Nose: No external deformity. EXTR: No clubbing/cyanosis/edema NEURO: Normal gait.  PSYCH: Normally interactive. Conversant. Not depressed or anxious appearing.  Calm demeanor.  RIGHT HAND;  Exquisitely tender and red and swollen right hand at base of thumb.  No crepitus.  Assessment and Plan: Gout Resume indocin and colcrys Elevate.   Local heat  Carmelina Dane, MD

## 2012-10-13 DIAGNOSIS — Z01810 Encounter for preprocedural cardiovascular examination: Secondary | ICD-10-CM

## 2012-11-03 ENCOUNTER — Other Ambulatory Visit: Payer: Self-pay | Admitting: Family Medicine

## 2012-11-03 NOTE — Telephone Encounter (Signed)
REFILL Citalopram HBR 20 MG Take 1 tablet (20 mg total) by mouth daily. #90, last fill 10.02.13--last ov--wt/labs 4.22.13 follow up

## 2012-11-04 ENCOUNTER — Other Ambulatory Visit: Payer: Self-pay | Admitting: *Deleted

## 2012-11-04 DIAGNOSIS — F329 Major depressive disorder, single episode, unspecified: Secondary | ICD-10-CM

## 2012-11-04 MED ORDER — CITALOPRAM HYDROBROMIDE 20 MG PO TABS: 20.0000 mg | ORAL_TABLET | Freq: Every day | ORAL | Status: DC

## 2012-11-04 NOTE — Telephone Encounter (Signed)
Refill for Celexa sent to pharmacy

## 2012-11-06 ENCOUNTER — Other Ambulatory Visit: Payer: Self-pay | Admitting: Emergency Medicine

## 2012-11-06 NOTE — Telephone Encounter (Signed)
Refill: Crestor 10 mg tablets. Take one tablet by mouth daily. Qty 30. Last fill 11-06-12

## 2012-11-07 MED ORDER — ROSUVASTATIN CALCIUM 10 MG PO TABS: 10.0000 mg | ORAL_TABLET | Freq: Every day | ORAL | Status: DC

## 2012-11-07 NOTE — Telephone Encounter (Signed)
Rx for Crestor 10mg  sent to the pharmacy by e-script.  Note that the pt needs an OV.  Rx for Citalopram 20mg  was sent to the pharmacy on 11-04-12.//AB/CMA

## 2012-11-10 ENCOUNTER — Telehealth: Payer: Self-pay | Admitting: Family Medicine

## 2012-11-10 DIAGNOSIS — N19 Unspecified kidney failure: Secondary | ICD-10-CM

## 2012-11-10 DIAGNOSIS — E875 Hyperkalemia: Secondary | ICD-10-CM

## 2012-11-10 NOTE — Telephone Encounter (Signed)
Refill: Furosemide 40 mg tablets. Take 1 tablet by mouth twice daily. Qty 180. Last fill 11-06-12

## 2012-11-10 NOTE — Telephone Encounter (Signed)
Please advise on RF request.  Pt's last OV and labs:02-25-12 and last RF done on 11-06-12.  No appt noted.//AB/CMA

## 2012-11-11 MED ORDER — FUROSEMIDE 40 MG PO TABS: 40.0000 mg | ORAL_TABLET | Freq: Two times a day (BID) | ORAL | Status: DC

## 2012-11-11 NOTE — Telephone Encounter (Signed)
LM @ (2:48pm) with the pt's wife asking the pt to RTC.//AB/CMA

## 2012-11-11 NOTE — Telephone Encounter (Signed)
Ok for 1 month but needs OV

## 2012-11-11 NOTE — Telephone Encounter (Signed)
Discuss with patient, Rx sent, appt scheduled.

## 2012-11-12 ENCOUNTER — Telehealth: Payer: Self-pay | Admitting: Family Medicine

## 2012-11-12 MED ORDER — ROSUVASTATIN CALCIUM 10 MG PO TABS: 10.0000 mg | ORAL_TABLET | Freq: Every day | ORAL | Status: DC

## 2012-11-12 NOTE — Telephone Encounter (Signed)
Refill: Crestor 10 mg tablets. Take one tablet by mouth daily. Qty 30. Last fill 11-06-12 °

## 2012-11-17 ENCOUNTER — Encounter: Payer: Self-pay | Admitting: Family Medicine

## 2012-11-17 ENCOUNTER — Ambulatory Visit (INDEPENDENT_AMBULATORY_CARE_PROVIDER_SITE_OTHER): Payer: Medicare Other | Admitting: Family Medicine

## 2012-11-17 VITALS — BP 130/68 | HR 60 | Temp 98.3°F | Ht 68.5 in | Wt 208.6 lb

## 2012-11-17 DIAGNOSIS — N259 Disorder resulting from impaired renal tubular function, unspecified: Secondary | ICD-10-CM

## 2012-11-17 DIAGNOSIS — E119 Type 2 diabetes mellitus without complications: Secondary | ICD-10-CM

## 2012-11-17 DIAGNOSIS — E669 Obesity, unspecified: Secondary | ICD-10-CM

## 2012-11-17 DIAGNOSIS — E785 Hyperlipidemia, unspecified: Secondary | ICD-10-CM

## 2012-11-17 DIAGNOSIS — I10 Essential (primary) hypertension: Secondary | ICD-10-CM

## 2012-11-17 LAB — CBC WITH DIFFERENTIAL/PLATELET
Basophils Relative: 1 % (ref 0.0–3.0)
Eosinophils Absolute: 0.7 10*3/uL (ref 0.0–0.7)
Eosinophils Relative: 8.4 % — ABNORMAL HIGH (ref 0.0–5.0)
Lymphocytes Relative: 26.7 % (ref 12.0–46.0)
MCV: 87.7 fl (ref 78.0–100.0)
Monocytes Absolute: 0.7 10*3/uL (ref 0.1–1.0)
Neutrophils Relative %: 54.2 % (ref 43.0–77.0)
Platelets: 238 10*3/uL (ref 150.0–400.0)
RBC: 3.99 Mil/uL — ABNORMAL LOW (ref 4.22–5.81)
WBC: 7.7 10*3/uL (ref 4.5–10.5)

## 2012-11-17 LAB — LIPID PANEL
Cholesterol: 120 mg/dL (ref 0–200)
Total CHOL/HDL Ratio: 4
Triglycerides: 234 mg/dL — ABNORMAL HIGH (ref 0.0–149.0)
VLDL: 46.8 mg/dL — ABNORMAL HIGH (ref 0.0–40.0)

## 2012-11-17 LAB — BASIC METABOLIC PANEL
BUN: 33 mg/dL — ABNORMAL HIGH (ref 6–23)
Calcium: 9 mg/dL (ref 8.4–10.5)
Creatinine, Ser: 1.7 mg/dL — ABNORMAL HIGH (ref 0.4–1.5)

## 2012-11-17 LAB — HEPATIC FUNCTION PANEL
Alkaline Phosphatase: 84 U/L (ref 39–117)
Bilirubin, Direct: 0.1 mg/dL (ref 0.0–0.3)
Total Bilirubin: 0.5 mg/dL (ref 0.3–1.2)
Total Protein: 8 g/dL (ref 6.0–8.3)

## 2012-11-17 LAB — HEMOGLOBIN A1C: Hgb A1c MFr Bld: 5.8 % (ref 4.6–6.5)

## 2012-11-17 LAB — LDL CHOLESTEROL, DIRECT: Direct LDL: 54.7 mg/dL

## 2012-11-17 NOTE — Patient Instructions (Addendum)
Schedule your complete physical in 3-4 months We'll notify you of your lab results and make any changes if needed Keep up the good work- you look great! Call with any questions or concerns Happy [birth year] Birthday and Happy New Year!

## 2012-11-17 NOTE — Progress Notes (Signed)
  Subjective:    Patient ID: Shawn Cruz, male    DOB: [birth year], 72 y.o.   MRN: [id number]  HPI HTN- chronic problem, on Lasix but not currently an ACE.  Pt has been having R LE s/p back surgery.  Has increased lasix dose to 2 tabs daily.  No CP, SOB, HAs, visual changes.  Hyperlipidemia- chronic problem, on Crestor nightly.  No abd pain, N/V, myalgias.  DM- chronic problem.  Not currently on meds.  Attempting to control w/ healthy diet.  Pt reports eye doctor told him not to return for 2 yrs- due for exam.  Denies symptomatic lows.  No numbness/tingling hands feet.     Review of Systems For ROS see HPI     Objective:   Physical Exam  Vitals reviewed. Constitutional: He is oriented to person, place, and time. He appears well-developed and well-nourished. No distress.  HENT:  Head: Normocephalic and atraumatic.  Eyes: Conjunctivae normal and EOM are normal. Pupils are equal, round, and reactive to light.  Neck: Normal range of motion. Neck supple. No thyromegaly present.  Cardiovascular: Normal rate, regular rhythm, normal heart sounds and intact distal pulses.   No murmur heard. Pulmonary/Chest: Effort normal and breath sounds normal. No respiratory distress.  Abdominal: Soft. Bowel sounds are normal. He exhibits no distension.  Musculoskeletal: He exhibits no edema.  Lymphadenopathy:    He has no cervical adenopathy.  Neurological: He is alert and oriented to person, place, and time. No cranial nerve deficit.  Skin: Skin is warm and dry.  Psychiatric: He has a normal mood and affect. His behavior is normal.          Assessment & Plan:

## 2012-11-18 NOTE — Assessment & Plan Note (Signed)
Chronic problem for pt.  States he was released by nephrology due to stability of Cr and is only to return PRN.

## 2012-11-18 NOTE — Assessment & Plan Note (Signed)
Chronic problem.  Not currently on meds.  Not checking CBGs.  Due for eye exam- encouraged him to schedule.  Asymptomatic.  Check labs.

## 2012-11-18 NOTE — Assessment & Plan Note (Signed)
Chronic problem.  Tolerating statin w/out difficulty.  Check labs.  Adjust meds prn  

## 2012-11-18 NOTE — Assessment & Plan Note (Signed)
Chronic problem.  Well controlled.  Asymptomatic.  Check labs.  Continue to follow.

## 2012-12-12 ENCOUNTER — Telehealth: Payer: Self-pay | Admitting: Family Medicine

## 2012-12-12 MED ORDER — ROSUVASTATIN CALCIUM 10 MG PO TABS: 10.0000 mg | ORAL_TABLET | Freq: Every day | ORAL | Status: DC

## 2012-12-12 NOTE — Telephone Encounter (Signed)
Refill: Crestor 10mg  tablets. Take 1 tablet by mouth daily, Qty 30. Last fill 11-12-12

## 2012-12-12 NOTE — Telephone Encounter (Signed)
Rx sent to the pharmacy by e-script.//AB/CMA 

## 2013-01-08 ENCOUNTER — Ambulatory Visit: Payer: Medicare Other | Attending: Neurosurgery | Admitting: Physical Therapy

## 2013-01-08 DIAGNOSIS — R5381 Other malaise: Secondary | ICD-10-CM | POA: Insufficient documentation

## 2013-01-08 DIAGNOSIS — IMO0001 Reserved for inherently not codable concepts without codable children: Secondary | ICD-10-CM | POA: Insufficient documentation

## 2013-01-08 DIAGNOSIS — M545 Low back pain, unspecified: Secondary | ICD-10-CM | POA: Insufficient documentation

## 2013-01-13 ENCOUNTER — Ambulatory Visit: Payer: Medicare Other | Admitting: Physical Therapy

## 2013-01-15 ENCOUNTER — Ambulatory Visit: Payer: Medicare Other | Admitting: Physical Therapy

## 2013-01-20 ENCOUNTER — Ambulatory Visit: Payer: Medicare Other | Admitting: Physical Therapy

## 2013-01-20 ENCOUNTER — Encounter: Payer: Medicare Other | Admitting: Physical Therapy

## 2013-01-22 ENCOUNTER — Ambulatory Visit: Payer: Medicare Other | Admitting: Physical Therapy

## 2013-02-27 ENCOUNTER — Telehealth: Payer: Self-pay | Admitting: Family Medicine

## 2013-02-27 DIAGNOSIS — E875 Hyperkalemia: Secondary | ICD-10-CM

## 2013-02-27 DIAGNOSIS — N19 Unspecified kidney failure: Secondary | ICD-10-CM

## 2013-02-27 MED ORDER — FUROSEMIDE 40 MG PO TABS: 40.0000 mg | ORAL_TABLET | Freq: Two times a day (BID) | ORAL | Status: DC

## 2013-02-27 NOTE — Telephone Encounter (Signed)
Refill-furosemide 40mg  tablets. Take one tablet by mouth twice a day. Qty 60 last fill 3.22.14

## 2013-02-27 NOTE — Telephone Encounter (Signed)
Refill for furosemide sen tot [facility] in [place]

## 2013-03-05 ENCOUNTER — Ambulatory Visit (INDEPENDENT_AMBULATORY_CARE_PROVIDER_SITE_OTHER): Payer: Medicare Other | Admitting: Family Medicine

## 2013-03-05 ENCOUNTER — Encounter: Payer: Self-pay | Admitting: Family Medicine

## 2013-03-05 VITALS — BP 100/78 | HR 60 | Temp 98.0°F | Ht 68.5 in | Wt 219.4 lb

## 2013-03-05 DIAGNOSIS — Z0000 Encounter for general adult medical examination without abnormal findings: Secondary | ICD-10-CM

## 2013-03-05 DIAGNOSIS — E1169 Type 2 diabetes mellitus with other specified complication: Secondary | ICD-10-CM

## 2013-03-05 DIAGNOSIS — I10 Essential (primary) hypertension: Secondary | ICD-10-CM

## 2013-03-05 DIAGNOSIS — E119 Type 2 diabetes mellitus without complications: Secondary | ICD-10-CM

## 2013-03-05 DIAGNOSIS — E785 Hyperlipidemia, unspecified: Secondary | ICD-10-CM

## 2013-03-05 DIAGNOSIS — E669 Obesity, unspecified: Secondary | ICD-10-CM

## 2013-03-05 LAB — CBC WITH DIFFERENTIAL/PLATELET
Basophils Relative: 0.5 % (ref 0.0–3.0)
Eosinophils Relative: 5 % (ref 0.0–5.0)
HCT: 40.7 % (ref 39.0–52.0)
Lymphs Abs: 2.6 10*3/uL (ref 0.7–4.0)
MCV: 86.3 fl (ref 78.0–100.0)
Monocytes Absolute: 0.9 10*3/uL (ref 0.1–1.0)
Neutro Abs: 5.4 10*3/uL (ref 1.4–7.7)
Platelets: 246 10*3/uL (ref 150.0–400.0)
WBC: 9.4 10*3/uL (ref 4.5–10.5)

## 2013-03-05 LAB — HEPATIC FUNCTION PANEL
ALT: 26 U/L (ref 0–53)
Albumin: 4.1 g/dL (ref 3.5–5.2)
Total Protein: 8.5 g/dL — ABNORMAL HIGH (ref 6.0–8.3)

## 2013-03-05 LAB — LIPID PANEL: Cholesterol: 124 mg/dL (ref 0–200)

## 2013-03-05 LAB — BASIC METABOLIC PANEL
BUN: 38 mg/dL — ABNORMAL HIGH (ref 6–23)
Chloride: 100 mEq/L (ref 96–112)
Potassium: 3.6 mEq/L (ref 3.5–5.1)

## 2013-03-05 LAB — TSH: TSH: 0.9 u[IU]/mL (ref 0.35–5.50)

## 2013-03-05 NOTE — Progress Notes (Signed)
  Subjective:    Patient ID: Shawn Cruz, male    DOB: [birth year], 72 y.o.   MRN: [id number]  HPI Here today for CPE.  Risk Factors: DM- chronic problem, usually well controlled w/ diet and exercise.  Not on medication.  Has gained weight after back surgery.  Has appt next month for eye exam.  No symptomatic lows, N/V, abd pain.  + tingling of feet bilaterally due to back surgery HTN- chronic problem, excellently controlled today w/ lasix.  No CP, SOB, HAs, visual changes.  + edema w/ activity Hyperlipidemia- chronic problem, on 1/2 tab Crestor daily (5mg ).  Denies abd pain, N/V, myalgias Physical Activity: little exercise due to leg pain s/p back surgery Fall Risk: low- moderate due to hx of falls Depression: no current sxs Hearing: decreased to whispered tones, normal to conversational tones ADL's: independent Cognitive: normal linear thought process, memory and attention intact Home Safety: safe at home, lives w/ wife Height, Weight, BMI, Visual Acuity: see vitals, vision corrected to 20/20 w/ glasses Counseling: UTD on colonoscopy Labs Ordered: See A&P Care Plan: See A&P    Review of Systems Patient reports no vision/hearing changes, anorexia, fever ,adenopathy, persistant/recurrent hoarseness, swallowing issues, chest pain, palpitations, edema, persistant/recurrent cough, hemoptysis, dyspnea (rest,exertional, paroxysmal nocturnal), gastrointestinal  bleeding (melena, rectal bleeding), abdominal pain, excessive heart burn, GU symptoms (dysuria, hematuria, voiding/incontinence issues) syncope, focal weakness, memory loss, skin/hair/nail changes, depression, anxiety, abnormal bruising/bleeding, musculoskeletal symptoms/signs.     Objective:   Physical Exam BP 100/78  Pulse 60  Temp(Src) 98 F (36.7 C) (Oral)  Ht 5' 8.5" (1.74 m)  Wt 219 lb 6.4 oz (99.519 kg)  BMI 32.87 kg/m2  SpO2 94%  General Appearance:    Alert, cooperative, no distress, appears stated age  Head:     Normocephalic, without obvious abnormality, atraumatic  Eyes:    PERRL, conjunctiva/corneas clear, EOM's intact, fundi    benign, both eyes       Ears:    Normal TM's and external ear canals, both ears  Nose:   Nares normal, septum midline, mucosa normal, no drainage   or sinus tenderness  Throat:   Lips, mucosa, and tongue normal; teeth and gums normal  Neck:   Supple, symmetrical, trachea midline, no adenopathy;       thyroid:  No enlargement/tenderness/nodules  Back:     Symmetric, no curvature, ROM normal, no CVA tenderness  Lungs:     Clear to auscultation bilaterally, respirations unlabored  Chest wall:    No tenderness or deformity  Heart:    Regular rate and rhythm, S1 and S2 normal, no murmur, rub   or gallop  Abdomen:     Soft, non-tender, bowel sounds active all four quadrants,    no masses, no organomegaly  Genitalia:    Normal male without lesion, discharge or tenderness  Rectal:    Normal tone, normal prostate, no masses or tenderness  Extremities:   Extremities normal, atraumatic, no cyanosis or edema  Pulses:   2+ and symmetric all extremities  Skin:   Skin color, texture, turgor normal, no rashes or lesions  Lymph nodes:   Cervical, supraclavicular, and axillary nodes normal  Neurologic:   CNII-XII intact. Normal strength, sensation and reflexes      throughout          Assessment & Plan:

## 2013-03-05 NOTE — Patient Instructions (Addendum)
Follow up in 6 months to recheck sugar and cholesterol We'll notify you of your lab results and make any changes if needed Keep up the good work!  You look great! Call with any questions or concerns Happy Spring!

## 2013-03-08 NOTE — Assessment & Plan Note (Signed)
Pt's PE WNL.  UTD on colonoscopy.  Check labs.  Anticipatory guidance provided.  

## 2013-03-08 NOTE — Assessment & Plan Note (Signed)
Chronic problem.  Adequate control.  Asymptomatic.  No med adjustments.

## 2013-03-08 NOTE — Assessment & Plan Note (Signed)
Chronic problem.  Tolerating statin w/out difficulty.  Check labs.  Adjust meds prn  

## 2013-03-08 NOTE — Assessment & Plan Note (Signed)
Chronic problem.  Has been controlling w/ diet.  Unable to exercise due to recent back surgery and residual pain.  Check labs, start med prn.

## 2013-05-07 ENCOUNTER — Other Ambulatory Visit: Payer: Self-pay | Admitting: Family Medicine

## 2013-05-07 NOTE — Telephone Encounter (Signed)
Refill done.  

## 2013-07-01 ENCOUNTER — Other Ambulatory Visit: Payer: Self-pay | Admitting: Family Medicine

## 2013-07-20 ENCOUNTER — Other Ambulatory Visit: Payer: Self-pay | Admitting: Family Medicine

## 2013-08-06 ENCOUNTER — Other Ambulatory Visit: Payer: Self-pay | Admitting: Family Medicine

## 2013-08-06 NOTE — Telephone Encounter (Signed)
Last OV 03-05-13 Med filled 05-07-13 #90 with 0 refills

## 2013-08-28 ENCOUNTER — Other Ambulatory Visit: Payer: Self-pay | Admitting: Family Medicine

## 2013-08-28 NOTE — Telephone Encounter (Signed)
Med filled.  

## 2013-09-29 ENCOUNTER — Other Ambulatory Visit: Payer: Self-pay | Admitting: Family Medicine

## 2013-09-29 NOTE — Telephone Encounter (Signed)
Med filled.  

## 2013-10-08 ENCOUNTER — Telehealth: Payer: Self-pay | Admitting: *Deleted

## 2013-10-08 ENCOUNTER — Encounter: Payer: Self-pay | Admitting: Family Medicine

## 2013-10-08 ENCOUNTER — Ambulatory Visit (INDEPENDENT_AMBULATORY_CARE_PROVIDER_SITE_OTHER): Payer: Medicare Other | Admitting: Family Medicine

## 2013-10-08 VITALS — BP 124/80 | HR 66 | Temp 98.1°F | Resp 16 | Wt 224.0 lb

## 2013-10-08 DIAGNOSIS — M109 Gout, unspecified: Secondary | ICD-10-CM | POA: Insufficient documentation

## 2013-10-08 MED ORDER — INDOMETHACIN 25 MG PO CAPS: 25.0000 mg | ORAL_CAPSULE | Freq: Three times a day (TID) | ORAL | Status: DC

## 2013-10-08 NOTE — Telephone Encounter (Signed)
Patient wife called and stated that patient has a gout flare up and would like an appointment with provider. Patient was made an appointment.

## 2013-10-08 NOTE — Progress Notes (Signed)
   Subjective:    Patient ID: ZAKARI BATHE, male    DOB: 25-May-1941, 72 y.o.   MRN: [id number]  HPI Pre visit review using our clinic review tool, if applicable. No additional management support is needed unless otherwise documented below in the visit note.   Foot pain- 'i got the gout'.  Pain started on Monday afternoon.  Started Colcrys Monday night w/ no relief.  + redness, swollen.  Pain is 'right over the achilles tendon.  No recent injuries.  Not currently on NSAIDs.  Has hx of renal insufficiency.   Review of Systems For ROS see HPI     Objective:   Physical Exam  Vitals reviewed. Constitutional: He is oriented to person, place, and time. He appears well-developed and well-nourished. No distress.  Musculoskeletal: He exhibits edema (of L ankle) and tenderness (TTP over L ankle).  Unable to bear weight on L ankle comfortably  Neurological: He is alert and oriented to person, place, and time.  Skin: Skin is warm. There is erythema (of L ankle, warm to touch).  Psychiatric: He has a normal mood and affect. His behavior is normal.          Assessment & Plan:

## 2013-10-08 NOTE — Patient Instructions (Signed)
Schedule your diabetes and cholesterol follow-up at your convenience Start the Indomethacin 3x/day until pain improves and then decrease to twice daily and then once daily- take w/ food STOP the Colchicine ICE! Elevate Call if no improvement Hang in there! Happy Holidays!

## 2013-10-09 ENCOUNTER — Telehealth: Payer: Self-pay | Admitting: Family Medicine

## 2013-10-09 LAB — CBC WITH DIFFERENTIAL/PLATELET
Basophils Absolute: 0.1 10*3/uL (ref 0.0–0.1)
Eosinophils Absolute: 0.1 10*3/uL (ref 0.0–0.7)
Eosinophils Relative: 1.1 % (ref 0.0–5.0)
HCT: 40 % (ref 39.0–52.0)
Hemoglobin: 13.4 g/dL (ref 13.0–17.0)
Lymphocytes Relative: 22.1 % (ref 12.0–46.0)
Lymphs Abs: 2.6 10*3/uL (ref 0.7–4.0)
MCHC: 33.7 g/dL (ref 30.0–36.0)
Monocytes Relative: 13.2 % — ABNORMAL HIGH (ref 3.0–12.0)
Neutro Abs: 7.4 10*3/uL (ref 1.4–7.7)
Neutrophils Relative %: 62.7 % (ref 43.0–77.0)
RBC: 4.46 Mil/uL (ref 4.22–5.81)
WBC: 11.8 10*3/uL — ABNORMAL HIGH (ref 4.5–10.5)

## 2013-10-09 LAB — BASIC METABOLIC PANEL
CO2: 25 mEq/L (ref 19–32)
Calcium: 9 mg/dL (ref 8.4–10.5)
Chloride: 100 mEq/L (ref 96–112)
Creatinine, Ser: 1.9 mg/dL — ABNORMAL HIGH (ref 0.4–1.5)
Glucose, Bld: 84 mg/dL (ref 70–99)
Potassium: 4 mEq/L (ref 3.5–5.1)
Sodium: 139 mEq/L (ref 135–145)

## 2013-10-09 NOTE — Telephone Encounter (Signed)
Spoke with provider, Dr. Beverely Low and Site Manager, Janna Arch. In leiu of patients demanding behavior, Dr. Beverely Low has requested this patient be dismissed. Letter printed and signed by provider, dismissal process started.

## 2013-10-09 NOTE — Telephone Encounter (Signed)
Patient called and wanted to speak to dr Beverely Low. Patient would not give me any information.

## 2013-10-09 NOTE — Telephone Encounter (Signed)
Called pt back and notified of results. When asked pt about what he would like to discuss with pt he advised that it was in regards to hs mediation dosage that is all he would divulge. Stated he would only speak with tabori and he would wait for her call.

## 2013-10-09 NOTE — Telephone Encounter (Signed)
Pt was instructed to take his Indomethacin as directed in his AVS and as bolded in the phone note.  He is NOT to take more than that due to his renal insufficiency.  I'm very sorry that the pt was upset that I could not speak to him on the phone but I have been seeing pt's all day and it is not fair to those in the office to have me pulled from their visit to address something that is clearly documented in pt's OV and when he has spoken w/ multiple CMAs and was given his lab results.  It is not my policy to call pt's when seeing pt's and to demand for me to call him after hours is very pushy.  I'm able to answer questions between pt's but I'm handling the concerns of those in the office and all of the pt's who call during the day- I hope he can understand.  But based on his insistence and then the mention of needing to physically remove him from the office, I will no longer be his provider.

## 2013-10-09 NOTE — Telephone Encounter (Signed)
spoke with the patient and he stated that what was discussed yesterday at the office visit and his pill bottle does not match, he wanted clarification.  I reviewed the information below: Start the Indomethacin 3x/day until pain improves and then decrease to twice daily and then once daily- take w/ food. The patient stated he was told to take 2 pill 3 times which is a total of 6. I discussed with Dr.Tabori and the she said she would prescribe this much medication due to the patient's kidney disease. I advised the patient and he was adamant about talking with Dr.Tabori. I explained to the patient that Dr.T was seeing patient's at this time and was unable to come to the phone, he said he did not want to talk to anyone else because he knew we would tell him what was documented, he said he knew what he was told and he would come in an sit in the lobby until the end if the day if he had too and we would have to call security to get him out if she did not come to the phone, I made him aware that we discussed with was documented and unfortunately I could not get Dr.Tabori to the phone, he said she can take 5 minutes of her day to call him back after hours, he said if he would switch providers since he could not talk to her, he said bye and hung up.      KP

## 2013-10-11 NOTE — Assessment & Plan Note (Signed)
Check labs to r/o septic joint and confirm gout flare.  Stop colchicine since it has been ineffective.  Start Indocin- renal dose due to elevated Cr.  Reviewed supportive care and red flags that should prompt return.  Pt expressed understanding and is in agreement w/ plan.

## 2013-10-15 ENCOUNTER — Encounter: Payer: Medicare Other | Admitting: Family Medicine

## 2013-10-15 ENCOUNTER — Ambulatory Visit: Payer: Medicare Other | Admitting: Family Medicine

## 2013-10-15 NOTE — Telephone Encounter (Signed)
Dismissal form and letter sent to medical records.

## 2013-10-16 ENCOUNTER — Telehealth: Payer: Self-pay | Admitting: Family Medicine

## 2013-10-16 NOTE — Telephone Encounter (Signed)
Dismissal Letter sent by Certified Mail 10/16/2013 ° °Received the Return Receipt showing someone picked up the Dismissal Letter 10/19/2013 °

## 2013-10-30 ENCOUNTER — Other Ambulatory Visit: Payer: Self-pay | Admitting: General Practice

## 2013-10-30 MED ORDER — CITALOPRAM HYDROBROMIDE 20 MG PO TABS: ORAL_TABLET | ORAL | Status: DC

## 2013-10-30 MED ORDER — FUROSEMIDE 40 MG PO TABS: ORAL_TABLET | ORAL | Status: DC

## 2013-11-13 ENCOUNTER — Other Ambulatory Visit: Payer: Self-pay | Admitting: Family Medicine

## 2013-11-13 NOTE — Telephone Encounter (Signed)
Med filled.  

## 2014-01-25 ENCOUNTER — Other Ambulatory Visit: Payer: Self-pay | Admitting: Family Medicine

## 2014-01-25 NOTE — Telephone Encounter (Signed)
Pt no longer with practice.

## 2014-08-14 ENCOUNTER — Ambulatory Visit (INDEPENDENT_AMBULATORY_CARE_PROVIDER_SITE_OTHER): Payer: Medicare Other | Admitting: Family Medicine

## 2014-08-14 VITALS — BP 138/68 | HR 78 | Temp 98.4°F | Resp 18 | Ht 69.0 in | Wt 224.4 lb

## 2014-08-14 DIAGNOSIS — J029 Acute pharyngitis, unspecified: Secondary | ICD-10-CM

## 2014-08-14 LAB — POCT RAPID STREP A (OFFICE): Rapid Strep A Screen: NEGATIVE

## 2014-08-14 MED ORDER — FIRST-DUKES MOUTHWASH MT SUSP: 5.0000 mL | OROMUCOSAL | Status: DC | PRN

## 2014-08-14 NOTE — Progress Notes (Signed)
This chart was scribed for Delman Cheadle, MD by Einar Pheasant, ED Scribe. This patient was seen in room 11 and the patient's care was started at 11:12 AM.  Subjective:    Patient ID: Shawn Cruz, male    DOB: 02/06/1941, 73 y.o.   MRN: [id number]  Chief Complaint  Patient presents with  . Sore Throat    x 2 days--pt describe as a golf ball in his throat--no fever--no N/V    HPI Shawn Cruz is a 73 y.o. male with a hx of bronchitis and DM  Today, pt comes in complaining of a gradual onset worsening sore throat that started 2 days ago. He is also complaining of associated swelling and a feeling that there is "golf ball" in his throat. Pt endorses gargling salt water and taking Dyquil. He also endorses diaphoresis, chills, and trouble swallowing secondary to the pain. Denies chest pain, SOB, sick contacts,leg swelling, or fever.    Patient Active Problem List   Diagnosis Date Noted  . Gout attack 10/08/2013  . Routine general medical examination at a health care facility 03/05/2013  . Bronchitis 01/21/2012  . Influenza-like illness 01/17/2012  . Skin cancer of arm 11/27/2011  . 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 Diabetes mellitus type 2 in obese 11/27/2011  . Polyphagia 08/02/2011  . Gout 07/19/2011  . Hyperglycemia 05/18/2011  . ANEMIA 01/03/2011  . RENAL INSUFFICIENCY 01/03/2011  . PRESSURE ULCER OTHER SITE 01/03/2011  . HYPERLIPIDEMIA 11/07/2010  . RESTLESS LEG SYNDROME 11/07/2010  . HYPERTENSION 11/07/2010  . DEGENERATIVE DISC DISEASE, LUMBOSACRAL SPINE 11/07/2010  . MUSCLE PAIN 11/07/2010   Past Medical History  Diagnosis Date  . DDD (degenerative disc disease)     LS spine  . Hyperglycemia   . Hypertension   . Chronic kidney disease   . Diabetes mellitus    Past Surgical History  Procedure Laterality Date  . Lumbar discetomy      1977  . Rotator cuff repair      Dr. Durward Fortes   Allergies  Allergen Reactions  . Sulfonamide Derivatives    REACTION: Age 36, leg swelling   Prior to Admission medications   Medication Sig Start Date End Date Taking? Authorizing Provider  allopurinol (ZYLOPRIM) 300 MG tablet Take 300 mg by mouth daily.   Yes Historical Provider, MD  aspirin 81 MG tablet Take 81 mg by mouth daily.     Yes Historical Provider, MD  Cholecalciferol (VITAMIN D) 2000 UNITS CAPS Take by mouth daily.     Yes Historical Provider, MD  citalopram (CELEXA) 20 MG tablet TAKE 1 TABLET BY MOUTH DAILY 10/30/13  Yes Midge Minium, MD  COLCRYS 0.6 MG tablet TAKE 2 TABLETS NOW AND 1 TABLET IN 1 HOUR. THEN TAKE 1 TABLET DAILY STARTING TOMORROW 11/06/12  Yes Ryan M Dunn, PA-C  CRESTOR 10 MG tablet TAKE 1 TABLET BY MOUTH DAILY 11/13/13  Yes Midge Minium, MD  furosemide (LASIX) 40 MG tablet TAKE 1 TABLET BY MOUTH TWICE DAILY 10/30/13  Yes Midge Minium, MD  HYDROcodone-acetaminophen (NORCO) 10-325 MG per tablet Take 1 tablet by mouth every 4 (four) hours as needed. No more than 6 daily as needed    Yes Historical Provider, MD  hydrOXYzine (ATARAX/VISTARIL) 25 MG tablet 10 mg.  01/14/12  Yes Historical Provider, MD  indomethacin (INDOCIN) 25 MG capsule Take 1 capsule (25 mg total) by mouth 3 (three) times daily with meals. 10/08/13  Yes Midge Minium, MD  polyethylene glycol (MIRALAX / GLYCOLAX) packet Take 17 g by mouth daily.   Yes Historical Provider, MD   History   Social History  . Marital Status: Married    Spouse Name: N/A    Number of Children: N/A  . Years of Education: N/A   Occupational History  . Not on file.   Social History Main Topics  . Smoking status: Former Research scientist (life sciences)  . Smokeless tobacco: Not on file     Comment: Quit at age 4  . Alcohol Use: Yes     Comment: occass  . Drug Use: No  . Sexual Activity: Not on file   Other Topics Concern  . Not on file   Social History Narrative  . No narrative on file     Review of Systems  Constitutional: Negative for appetite change and fatigue.  HENT:  Positive for sore throat and trouble swallowing. Negative for congestion, ear discharge and sinus pressure.   Eyes: Negative for discharge.  Respiratory: Negative for cough.   Cardiovascular: Negative for chest pain and leg swelling.  Gastrointestinal: Negative for abdominal pain and diarrhea.  Genitourinary: Negative for frequency and hematuria.  Musculoskeletal: Negative for back pain.  Skin: Negative for rash.  Neurological: Negative for seizures and headaches.  Psychiatric/Behavioral: Negative for hallucinations.      Triage vitals: BP 138/68  Pulse 78  Temp(Src) 98.4 F (36.9 C) (Oral)  Resp 18  Ht 5\' 9"  (1.753 m)  Wt 224 lb 6.4 oz (101.787 kg)  BMI 33.12 kg/m2  SpO2 93%  Objective:   Physical Exam  Nursing note and vitals reviewed. Constitutional: He appears well-developed and well-nourished. No distress.  HENT:  Head: Normocephalic and atraumatic.  Right Ear: Tympanic membrane normal.  Left Ear: Tympanic membrane is injected.  Nose: Rhinorrhea present.  Mouth/Throat: Posterior oropharyngeal edema (mild) and posterior oropharyngeal erythema (mild) present. No oropharyngeal exudate.  Eyes: Conjunctivae are normal. Right eye exhibits no discharge. Left eye exhibits no discharge.  Neck: Neck supple. No thyromegaly present.  Cardiovascular: Normal rate, regular rhythm, S1 normal, S2 normal and normal heart sounds.  Exam reveals no gallop and no friction rub.   No murmur heard. Pulmonary/Chest: Effort normal and breath sounds normal. No respiratory distress.  Good air movement. Lungs are clear. Cough with forced expiration but no wheezing.  Abdominal: Soft. He exhibits no distension. There is no tenderness.  Musculoskeletal: He exhibits no edema and no tenderness.  Lymphadenopathy:       Head (right side): Tonsillar adenopathy present.       Head (left side): Tonsillar adenopathy present.    He has no cervical adenopathy.       Right cervical: No posterior cervical  adenopathy present.      Left cervical: No posterior cervical adenopathy present.       Right: No supraclavicular adenopathy present.       Left: No supraclavicular adenopathy present.  Neurological: He is alert.  Skin: Skin is warm and dry.  Psychiatric: He has a normal mood and affect. His behavior is normal. Thought content normal.    Results for orders placed in visit on 08/14/14  POCT RAPID STREP A (OFFICE)      Result Value Ref Range   Rapid Strep A Screen Negative  Negative     Assessment & Plan:   Acute pharyngitis, unspecified pharyngitis type - Plan: POCT rapid strep A, Culture, Group A Strep Supportive therapy Meds ordered this encounter  Medications  . allopurinol (ZYLOPRIM) 300  MG tablet    Sig: Take 300 mg by mouth daily.  . Diphenhyd-Hydrocort-Nystatin (FIRST-DUKES MOUTHWASH) SUSP    Sig: Use as directed 5 mLs in the mouth or throat every 2 (two) hours as needed (sore throat).    Dispense:  237 mL    Refill:  0    Ok to use pharmacy formulary as long as diphenhydramine and hydrocortisone are in it.    I personally performed the services described in this documentation, which was scribed in my presence. The recorded information has been reviewed and considered, and addended by me as needed.  Delman Cheadle, MD MPH

## 2014-08-14 NOTE — Patient Instructions (Signed)

## 2014-08-16 LAB — CULTURE, GROUP A STREP: Organism ID, Bacteria: NORMAL

## 2014-10-05 ENCOUNTER — Ambulatory Visit
Admission: RE | Admit: 2014-10-05 | Discharge: 2014-10-05 | Disposition: A | Discharge status: Home / Self Care | Payer: Medicare Other | Source: Ambulatory Visit | Attending: Nurse Practitioner | Admitting: Nurse Practitioner

## 2014-10-05 ENCOUNTER — Other Ambulatory Visit: Payer: Self-pay | Admitting: Nurse Practitioner

## 2014-10-05 DIAGNOSIS — R062 Wheezing: Secondary | ICD-10-CM

## 2014-10-05 DIAGNOSIS — R0602 Shortness of breath: Secondary | ICD-10-CM

## 2014-11-05 DIAGNOSIS — R0602 Shortness of breath: Secondary | ICD-10-CM | POA: Diagnosis not present

## 2014-11-05 DIAGNOSIS — R062 Wheezing: Secondary | ICD-10-CM | POA: Diagnosis not present

## 2014-11-16 ENCOUNTER — Ambulatory Visit
Admission: RE | Admit: 2014-11-16 | Discharge: 2014-11-16 | Disposition: A | Discharge status: Home / Self Care | Payer: Medicare Other | Source: Ambulatory Visit | Attending: Cardiology | Admitting: Cardiology

## 2014-11-16 ENCOUNTER — Other Ambulatory Visit: Payer: Self-pay | Admitting: Cardiology

## 2014-11-16 DIAGNOSIS — R0602 Shortness of breath: Secondary | ICD-10-CM | POA: Diagnosis not present

## 2014-11-16 DIAGNOSIS — J40 Bronchitis, not specified as acute or chronic: Secondary | ICD-10-CM | POA: Diagnosis not present

## 2014-11-16 DIAGNOSIS — I10 Essential (primary) hypertension: Secondary | ICD-10-CM | POA: Diagnosis not present

## 2014-11-16 DIAGNOSIS — E559 Vitamin D deficiency, unspecified: Secondary | ICD-10-CM | POA: Diagnosis not present

## 2014-11-16 DIAGNOSIS — I440 Atrioventricular block, first degree: Secondary | ICD-10-CM | POA: Diagnosis not present

## 2014-11-16 DIAGNOSIS — E780 Pure hypercholesterolemia: Secondary | ICD-10-CM | POA: Diagnosis not present

## 2014-11-16 DIAGNOSIS — Z87891 Personal history of nicotine dependence: Secondary | ICD-10-CM | POA: Diagnosis not present

## 2014-11-16 DIAGNOSIS — E1129 Type 2 diabetes mellitus with other diabetic kidney complication: Secondary | ICD-10-CM | POA: Diagnosis not present

## 2014-11-18 DIAGNOSIS — I10 Essential (primary) hypertension: Secondary | ICD-10-CM | POA: Diagnosis not present

## 2014-11-22 DIAGNOSIS — R0602 Shortness of breath: Secondary | ICD-10-CM | POA: Diagnosis not present

## 2014-12-02 ENCOUNTER — Telehealth: Payer: Self-pay | Admitting: Internal Medicine

## 2014-12-02 DIAGNOSIS — E1129 Type 2 diabetes mellitus with other diabetic kidney complication: Secondary | ICD-10-CM | POA: Diagnosis not present

## 2014-12-02 DIAGNOSIS — E780 Pure hypercholesterolemia: Secondary | ICD-10-CM | POA: Diagnosis not present

## 2014-12-02 DIAGNOSIS — R0602 Shortness of breath: Secondary | ICD-10-CM | POA: Diagnosis not present

## 2014-12-02 DIAGNOSIS — I10 Essential (primary) hypertension: Secondary | ICD-10-CM | POA: Diagnosis not present

## 2014-12-02 NOTE — Telephone Encounter (Signed)
Dr Einar Gip just informed he to see this patient asap <  7 days from 12/02/2014 for dyspnea. How soon can you work him in?

## 2014-12-02 NOTE — Telephone Encounter (Signed)
There are 3 openings next week (week of 2/1) but all are 15 min slots.

## 2014-12-03 NOTE — Telephone Encounter (Signed)
Spoke with pt's wife janis (DPR on file) pt was originally scheduled for 01/04/15 but moved appt to 12/09/14 @4 :30.  Advised pt's wife to arrive 15 minutes early to complete paperwork.  Nothing further needed. Forwarding only as a fyi.

## 2014-12-03 NOTE — Telephone Encounter (Signed)
GEt him into those 15 min slots. But let him know that

## 2014-12-06 DIAGNOSIS — R062 Wheezing: Secondary | ICD-10-CM | POA: Diagnosis not present

## 2014-12-06 DIAGNOSIS — R0602 Shortness of breath: Secondary | ICD-10-CM | POA: Diagnosis not present

## 2014-12-09 ENCOUNTER — Ambulatory Visit (INDEPENDENT_AMBULATORY_CARE_PROVIDER_SITE_OTHER): Payer: Medicare Other | Admitting: Internal Medicine

## 2014-12-09 ENCOUNTER — Encounter: Payer: Self-pay | Admitting: Internal Medicine

## 2014-12-09 VITALS — BP 138/92 | HR 74 | Ht 68.0 in | Wt 231.0 lb

## 2014-12-09 DIAGNOSIS — R0600 Dyspnea, unspecified: Secondary | ICD-10-CM | POA: Diagnosis not present

## 2014-12-09 DIAGNOSIS — R0989 Other specified symptoms and signs involving the circulatory and respiratory systems: Secondary | ICD-10-CM | POA: Diagnosis not present

## 2014-12-09 DIAGNOSIS — R0689 Other abnormalities of breathing: Secondary | ICD-10-CM

## 2014-12-09 NOTE — Patient Instructions (Addendum)
ICD-9-CM ICD-10-CM   1. Dyspnea and respiratory abnormality 786.09 R06.00     R06.89   2. Bibasilar crackles 786.7 R09.89    Do CT chest Do PFT test   If he has ILD will need to admnister  autoimmune questionnaire at [facility]   REturn to see my NP Tammy opr me in < 2 weeks

## 2014-12-09 NOTE — Progress Notes (Signed)
Subjective:    Patient ID: Shawn Cruz, male    DOB: 09-08-1941, 74 y.o.   MRN: [id number]   PCP Delia Chimes, NP   HPI  IOV 12/09/2014  Chief Complaint  Patient presents with  . Pulmonary Consult    Pt referred by Dr. Einar Gip for dyspnea.    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 Reports that 6-8 weeks ago had a "bad case of bronchitis" and since then has been dyspneic. Prior to that he denies any dyspnea. He was treated at home with antibiotics does not recollect prednisone. He was not extremely sick and that he needed to be in bed all the time but is only somewhat sick. Certainly he did not get hospitalized. Since then has had stable dyspnea that is class III in exertion and relieved by rest. He gets dyspneic taking a shower. There is no associated cough but wife thinks that he might have some associated wheezing. There is no orthopnea paroxysmal nocturnal dyspnea. Symptoms are rated as moderate in severity. Also this past year he has gained 20 pounds of weight  He is seeing cardiologist Dr. Einar Gip whose notes I have reviewed. He did have echocardiogram 11/18/2014 that shows only a mild grade 1 diastolic dysfunction with a left ventricle ejection fraction of 54% and a mildly dilated left atrial cavity and moderate concentric LVH.  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 Ejection fraction was 51%. It represented a low risk study   Walking desaturation test in the office 185 feet 3 laps: Did not desaturate   has a past medical history of DDD (degenerative disc disease); Hyperglycemia; Hypertension; Chronic kidney disease; Diabetes mellitus; and High cholesterol.   reports that he quit smoking about 9 years ago. His smoking use included Cigarettes. He has a 50 pack-year smoking  history. He has quit using smokeless tobacco. His smokeless tobacco use included Chew.  Past Surgical History  Procedure Laterality Date  . Lumbar discetomy      1977  . Rotator cuff repair      Dr. Durward Fortes  . Back surrgery      2013    Allergies  Allergen Reactions  . Sulfonamide Derivatives     REACTION: Age 33, leg swelling    Immunization History  Administered Date(s) Administered  . Influenza Split 07/06/2014  . Influenza Whole 07/26/2011  . Influenza,inj,Quad PF,36+ Mos 08/04/2013  . Pneumococcal Conjugate-13 08/05/2014  . Pneumococcal Polysaccharide-23 08/22/2012    Family History  Problem Relation Age of Onset  . Hypertension Mother   . Heart attack Mother     age 3's  . Hypertension Father   . Heart attack Father     age 20's  . Aortic aneurysm Sister      Current outpatient prescriptions:  .  allopurinol (ZYLOPRIM) 300 MG tablet, Take 300 mg by mouth daily., Disp: , Rfl:  .  aspirin 81 MG tablet, Take 81 mg by mouth daily.  , Disp: , Rfl:  .  atorvastatin (LIPITOR) 10 MG tablet, Take 10 mg by mouth daily., Disp: , Rfl:  .  chlorpheniramine (CHLOR-TRIMETON) 4 MG tablet, Take 4 mg by mouth 2 (two) times daily as needed for allergies., Disp: , Rfl:  .  Cholecalciferol (VITAMIN D) 2000 UNITS CAPS, Take by mouth daily.  , Disp: ,  Rfl:  .  citalopram (CELEXA) 20 MG tablet, TAKE 1 TABLET BY MOUTH DAILY, Disp: 90 tablet, Rfl: 0 .  COLCRYS 0.6 MG tablet, TAKE 2 TABLETS NOW AND 1 TABLET IN 1 HOUR. THEN TAKE 1 TABLET DAILY STARTING TOMORROW, Disp: 30 tablet, Rfl: 0 .  cyanocobalamin 100 MCG tablet, Take 100 mcg by mouth daily., Disp: , Rfl:  .  furosemide (LASIX) 40 MG tablet, TAKE 1 TABLET BY MOUTH TWICE DAILY, Disp: 60 tablet, Rfl: 0 .  HYDROcodone-acetaminophen (NORCO) 10-325 MG per tablet, Take 1 tablet by mouth every 4 (four) hours as needed. No more than 6 daily as needed , Disp: , Rfl:  .  polyethylene glycol (MIRALAX / GLYCOLAX) packet, Take 17 g by mouth  daily., Disp: , Rfl:      Review of Systems  Constitutional: Negative for fever and unexpected weight change.  HENT: Positive for rhinorrhea and trouble swallowing. Negative for congestion, dental problem, ear pain, nosebleeds, postnasal drip, sinus pressure, sneezing and sore throat.   Eyes: Negative for redness and itching.  Respiratory: Positive for shortness of breath. Negative for cough, chest tightness and wheezing.   Cardiovascular: Negative for palpitations and leg swelling.  Gastrointestinal: Negative for nausea and vomiting.  Genitourinary: Negative for dysuria.  Musculoskeletal: Positive for back pain. Negative for joint swelling.  Skin: Negative for rash.  Neurological: Negative for headaches.  Hematological: Does not bruise/bleed easily.  Psychiatric/Behavioral: Negative for dysphoric mood. The patient is not nervous/anxious.        Objective:   Physical Exam  Constitutional: He is oriented to person, place, and time. He appears well-developed and well-nourished. No distress.  Obese male pleasant no distress  HENT:  Head: Normocephalic and atraumatic.  Right Ear: External ear normal.  Left Ear: External ear normal.  Mouth/Throat: Oropharynx is clear and moist. No oropharyngeal exudate.  Mallampati class III  Eyes: Conjunctivae and EOM are normal. Pupils are equal, round, and reactive to light. Right eye exhibits no discharge. Left eye exhibits no discharge. No scleral icterus.  Neck: Normal range of motion. Neck supple. No JVD present. No tracheal deviation present. No thyromegaly present.  Cardiovascular: Normal rate, regular rhythm and intact distal pulses.  Exam reveals no gallop and no friction rub.   No murmur heard. Pulmonary/Chest: Effort normal. No respiratory distress. He has no wheezes. He has rales. He exhibits no tenderness.  Appears to have bibasal crackles  Abdominal: Soft. Bowel sounds are normal. He exhibits no distension and no mass. There is no  tenderness. There is no rebound and no guarding.  Musculoskeletal: Normal range of motion. He exhibits no edema or tenderness.  Lymphadenopathy:    He has no cervical adenopathy.  Neurological: He is alert and oriented to person, place, and time. He has normal reflexes. No cranial nerve deficit. Coordination normal.  Skin: Skin is warm and dry. No rash noted. He is not diaphoretic. No erythema. No pallor.  Psychiatric: He has a normal mood and affect. His behavior is normal. Judgment and thought content normal.  Nursing note and vitals reviewed.   Filed Vitals:   12/09/14 1659  BP: 138/92  Pulse: 74  Height: 5\' 8"  (1.727 m)  Weight: 231 lb (104.781 kg)  SpO2: 94%         Assessment & Plan:     ICD-9-CM ICD-10-CM   1. Dyspnea and respiratory abnormality 786.09 R06.00 Pulmonary function test    R06.89 CT Chest High Resolution  2. Bibasilar crackles 786.7 R09.89 Pulmonary function  test     CT Chest High Resolution   My concern is that he has interstitial lung disease that has flared up after his recent episode of bronchitis. This can happen occasionally 15% of the time and can be the presenting manifestation of IPF disease. Alternatively with a smoking history he is at risk for COPD. Other causes of dyspnea include obesity and diastolic dysfunction and physical deconditioning  Plan Do a full pulmonary function test and a CT scan of the chest and regroup. If the CT scan of the chest shows interstitial lung disease we will also get a autoimmune panel. If these investigations are nonrevealing will do a cardiopulmonary stress test versus cardiology to proceed with heart catheterization  If he has ILD will need to admnister ACCP ILD questionnaire at [facility]  He agrees and wants to proceed with the plan   Dr. Brand Males, M.D., [facility].C.P Pulmonary and Critical Care Medicine Staff Physician [place] [facility] Pager: [phone], If no answer or  between  15:00h - 7:00h: call [phone]  12/09/2014 5:39 PM

## 2014-12-14 ENCOUNTER — Ambulatory Visit (HOSPITAL_COMMUNITY)
Admission: RE | Admit: 2014-12-14 | Discharge: 2014-12-14 | Disposition: A | Discharge status: Home / Self Care | Payer: Medicare Other | Source: Ambulatory Visit | Attending: Internal Medicine | Admitting: Internal Medicine

## 2014-12-14 DIAGNOSIS — R0600 Dyspnea, unspecified: Secondary | ICD-10-CM | POA: Diagnosis not present

## 2014-12-14 DIAGNOSIS — R0689 Other abnormalities of breathing: Secondary | ICD-10-CM | POA: Insufficient documentation

## 2014-12-14 DIAGNOSIS — R0989 Other specified symptoms and signs involving the circulatory and respiratory systems: Secondary | ICD-10-CM | POA: Diagnosis not present

## 2014-12-14 LAB — PULMONARY FUNCTION TEST
DL/VA % PRED: 67 %
DL/VA: 3.01 ml/min/mmHg/L
DLCO unc % pred: 43 %
DLCO unc: 12.47 ml/min/mmHg
FEF 25-75 PRE: 2.86 L/s
FEF 25-75 Post: 2.83 L/sec
FEF2575-%Change-Post: -1 %
FEF2575-%PRED-PRE: 133 %
FEF2575-%Pred-Post: 132 %
FEV1-%CHANGE-POST: 1 %
FEV1-%Pred-Post: 76 %
FEV1-%Pred-Pre: 75 %
FEV1-Post: 2.18 L
FEV1-Pre: 2.16 L
FEV1FVC-%Change-Post: 3 %
FEV1FVC-%Pred-Pre: 114 %
FEV6-%CHANGE-POST: -2 %
FEV6-%PRED-POST: 68 %
FEV6-%Pred-Pre: 70 %
FEV6-POST: 2.52 L
FEV6-PRE: 2.58 L
FEV6FVC-%CHANGE-POST: 0 %
FEV6FVC-%Pred-Post: 106 %
FEV6FVC-%Pred-Pre: 106 %
FVC-%Change-Post: -2 %
FVC-%Pred-Post: 64 %
FVC-%Pred-Pre: 65 %
FVC-Post: 2.52 L
FVC-Pre: 2.58 L
POST FEV6/FVC RATIO: 100 %
Post FEV1/FVC ratio: 86 %
Pre FEV1/FVC ratio: 83 %
Pre FEV6/FVC Ratio: 100 %
RV % pred: 94 %
RV: 2.24 L
TLC % pred: 72 %
TLC: 4.72 L

## 2014-12-14 MED ORDER — ALBUTEROL SULFATE (2.5 MG/3ML) 0.083% IN NEBU
2.5000 mg | INHALATION_SOLUTION | Freq: Once | RESPIRATORY_TRACT | Status: AC
  Administered 2014-12-14: 2.5 mg via RESPIRATORY_TRACT

## 2014-12-15 ENCOUNTER — Telehealth: Payer: Self-pay | Admitting: Internal Medicine

## 2014-12-15 NOTE — Telephone Encounter (Signed)
pft shows restriction with low dlco. Will wait for CT chest. If CT chees shows ild need autoimmune panel

## 2014-12-21 ENCOUNTER — Ambulatory Visit (INDEPENDENT_AMBULATORY_CARE_PROVIDER_SITE_OTHER)
Admission: RE | Admit: 2014-12-21 | Discharge: 2014-12-21 | Disposition: A | Discharge status: Home / Self Care | Payer: Medicare Other | Source: Ambulatory Visit | Attending: Internal Medicine | Admitting: Internal Medicine

## 2014-12-21 DIAGNOSIS — R0989 Other specified symptoms and signs involving the circulatory and respiratory systems: Secondary | ICD-10-CM | POA: Diagnosis not present

## 2014-12-21 DIAGNOSIS — R0600 Dyspnea, unspecified: Secondary | ICD-10-CM | POA: Diagnosis not present

## 2014-12-21 DIAGNOSIS — I2510 Atherosclerotic heart disease of native coronary artery without angina pectoris: Secondary | ICD-10-CM | POA: Diagnosis not present

## 2014-12-21 DIAGNOSIS — R0602 Shortness of breath: Secondary | ICD-10-CM | POA: Diagnosis not present

## 2014-12-21 DIAGNOSIS — R0689 Other abnormalities of breathing: Secondary | ICD-10-CM

## 2014-12-23 ENCOUNTER — Other Ambulatory Visit (INDEPENDENT_AMBULATORY_CARE_PROVIDER_SITE_OTHER): Payer: Medicare Other

## 2014-12-23 ENCOUNTER — Encounter: Payer: Self-pay | Admitting: Adult Health

## 2014-12-23 ENCOUNTER — Ambulatory Visit (INDEPENDENT_AMBULATORY_CARE_PROVIDER_SITE_OTHER): Payer: Medicare Other | Admitting: Adult Health

## 2014-12-23 ENCOUNTER — Telehealth: Payer: Self-pay | Admitting: Internal Medicine

## 2014-12-23 VITALS — BP 132/84 | HR 81 | Temp 98.7°F | Ht 68.0 in | Wt 233.0 lb

## 2014-12-23 DIAGNOSIS — J849 Interstitial pulmonary disease, unspecified: Secondary | ICD-10-CM

## 2014-12-23 LAB — C-REACTIVE PROTEIN: CRP: 1.1 mg/dL (ref 0.5–20.0)

## 2014-12-23 LAB — SEDIMENTATION RATE: Sed Rate: 43 mm/hr — ABNORMAL HIGH (ref 0–22)

## 2014-12-23 LAB — RHEUMATOID FACTOR

## 2014-12-23 NOTE — Telephone Encounter (Signed)
Serum: ESR, ACE, ANA, DS-DNA, RF, anti-CCP, ssA, ssB, scl-70, ANCA, Total CK,  Aldolase, ANCA screen, MPO, PR-3  Hypersensitivity Pneumonitis Panel  ios the autoimmune needed

## 2014-12-23 NOTE — Progress Notes (Signed)
Subjective:    Patient ID: Shawn Cruz, male    DOB: October 07, 1941, 74 y.o.   MRN: [id number]   PCP Delia Chimes, NP   HPI  IOV 12/09/2014  Chief Complaint  Patient presents with  . Pulmonary Consult    Pt referred by Dr. Einar Gip for dyspnea.    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 Reports that 6-8 weeks ago had a "bad case of bronchitis" and since then has been dyspneic. Prior to that he denies any dyspnea. He was treated at home with antibiotics does not recollect prednisone. He was not extremely sick and that he needed to be in bed all the time but is only somewhat sick. Certainly he did not get hospitalized. Since then has had stable dyspnea that is class III in exertion and relieved by rest. He gets dyspneic taking a shower. There is no associated cough but wife thinks that he might have some associated wheezing. There is no orthopnea paroxysmal nocturnal dyspnea. Symptoms are rated as moderate in severity. Also this past year he has gained 20 pounds of weight  He is seeing cardiologist Dr. Einar Gip whose notes I have reviewed. He did have echocardiogram 11/18/2014 that shows only a mild grade 1 diastolic dysfunction with a left ventricle ejection fraction of 54% and a mildly dilated left atrial cavity and moderate concentric LVH.  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 Ejection fraction was 51%. It represented a low risk study   Walking desaturation test in the office 185 feet 3 laps: Did not desaturate   has a past medical history of DDD (degenerative disc disease); Hyperglycemia; Hypertension; Chronic kidney disease; Diabetes mellitus; and High cholesterol.   reports that he quit smoking about 9 years ago. His smoking use included Cigarettes. He has a 50 pack-year smoking  history. He has quit using smokeless tobacco. His smokeless tobacco use included Chew.    12/23/2014 Follow up : Dyspnea /Smoker  Returns for a two-week follow-up He was seen 2 weeks ago for pulmonary consultation for progressive dyspnea Is a former heavy smoker. Patient was set up for a high resolution CT chest. Which showed very mild ground glass attenuation in the lungs bilaterally. There was subpleural and. Bronchovascular reticulation. There was mild bronchiectasis throughout the lungs bilaterally. The left upper lobe had some thick walled cyst. The appearance. 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 Pulmonary function test showed an FEV1 of 2.16 L, 75% of the predicted, ratio 83, FVC decreased at 65%, no significant bronchodilator response, decreased diffusing capacity of 43% Last visit with no desaturations with ambulation We discussed all of his test results and need to proceed with lab work for autoimmune workup. He denies any hemoptysis, chest pain, orthopnea, PND, or increased leg swelling He is followed by Dr. Einar Gip in cardiology. CT did recognize atherosclerosis in the coronary arteries, have suggested that he follow back up with Dr. gone GI to determine if he needs any further testing.      Review of Systems  Constitutional:   No  weight loss, night sweats,  Fevers, chills,  +fatigue, or  lassitude.  HEENT:   No headaches,  Difficulty swallowing,  Tooth/dental problems, or  Sore throat,  No sneezing, itching, ear ache, nasal congestion, post nasal drip,   CV:  No chest pain,  Orthopnea, PND, swelling in lower extremities, anasarca, dizziness, palpitations, syncope.   GI  No heartburn, indigestion, abdominal pain, nausea, vomiting, diarrhea, change in bowel habits, loss of appetite, bloody stools.   Resp:    No chest wall deformity  Skin: no rash or lesions.  GU: no dysuria, change in color of urine, no  urgency or frequency.  No flank pain, no hematuria   MS:  No joint pain or swelling.  No decreased range of motion.  No back pain.  Psych:  No change in mood or affect. No depression or anxiety.  No memory loss.         Objective:   Physical Exam  GEN: A/Ox3; pleasant , NAD, obese   HEENT:  [address]/AT,  EACs-clear, TMs-wnl, NOSE-clear, THROAT-clear, no lesions, no postnasal drip or exudate noted.   NECK:  Supple w/ fair ROM; no JVD; normal carotid impulses w/o bruits; no thyromegaly or nodules palpated; no lymphadenopathy.  RESP  Faint bibasilar crackles no accessory muscle use, no dullness to percussion  CARD:  RRR, no m/r/g  , tr  peripheral edema, pulses intact, no cyanosis or clubbing.  GI:   Soft & nt; nml bowel sounds; no organomegaly or masses detected.  Musco: Warm bil, no deformities or joint swelling noted.   Neuro: alert, no focal deficits noted.    Skin: Warm, no lesions or rashes        Assessment & Plan:

## 2014-12-23 NOTE — Assessment & Plan Note (Signed)
CT chest suggestive for interstitial lung disease Pulmonary function test shows no significant airflow obstruction. However, does show restrictive pattern with a severe diffusing defect Patient has no desaturations with ambulation. Proceed with an autoimmune panel. Patient return back here in 6 weeks for follow with Dr. Chase Caller

## 2014-12-23 NOTE — Patient Instructions (Addendum)
Labs today  Follow up with Dr. Einar Gip , cardiology , plaque build up was noted in coronary arteries on CT .  Follow up Dr. Chase Caller in 6-8 weeks and As needed

## 2014-12-24 LAB — ANTI-DNA ANTIBODY, DOUBLE-STRANDED: ds DNA Ab: 2 IU/mL

## 2014-12-24 LAB — MPO/PR-3 (ANCA) ANTIBODIES
Myeloperoxidase Abs: <1
Serine Protease 3: <1

## 2014-12-24 LAB — ANGIOTENSIN CONVERTING ENZYME: ANGIOTENSIN-CONVERTING ENZYME: 39 U/L (ref 8–52)

## 2014-12-24 LAB — CYCLIC CITRUL PEPTIDE ANTIBODY, IGG: CYCLIC CITRULLIN PEPTIDE AB: 10.4 U/mL — AB (ref 0.0–5.0)

## 2014-12-24 LAB — ANA: Anti Nuclear Antibody(ANA): POSITIVE — AB

## 2014-12-24 LAB — ANTI-NUCLEAR AB-TITER (ANA TITER): ANA Titer 1: 1:160 {titer} — ABNORMAL HIGH

## 2014-12-24 LAB — SJOGREN'S SYNDROME ANTIBODS(SSA + SSB)
SSA (RO) (ENA) ANTIBODY, IGG: NEGATIVE
SSB (LA) (ENA) ANTIBODY, IGG: NEGATIVE

## 2014-12-24 LAB — ANTI-SCLERODERMA ANTIBODY: SCLERODERMA (SCL-70) (ENA) ANTIBODY, IGG: NEGATIVE

## 2014-12-25 LAB — ALDOLASE: Aldolase: 7.6 U/L (ref <=8.1)

## 2014-12-27 DIAGNOSIS — E780 Pure hypercholesterolemia: Secondary | ICD-10-CM | POA: Diagnosis not present

## 2014-12-27 DIAGNOSIS — R0602 Shortness of breath: Secondary | ICD-10-CM | POA: Diagnosis not present

## 2014-12-27 DIAGNOSIS — E1129 Type 2 diabetes mellitus with other diabetic kidney complication: Secondary | ICD-10-CM | POA: Diagnosis not present

## 2014-12-27 DIAGNOSIS — I10 Essential (primary) hypertension: Secondary | ICD-10-CM | POA: Diagnosis not present

## 2014-12-28 LAB — HYPERSENSITIVITY PNUEMONITIS PROFILE

## 2014-12-29 ENCOUNTER — Telehealth: Payer: Self-pay | Admitting: *Deleted

## 2014-12-29 NOTE — Telephone Encounter (Signed)
Shawn Cruz w/[facility] called and stated they weren't able to run the MB on the CKMB due to  Not enough frozen serum. The frozen serum they had was used for another test. They are asking if you wanted the CKMB or just a CK total.

## 2014-12-30 LAB — CK TOTAL AND CKMB (NOT AT ARMC): Total CK: 179 U/L (ref 7–232)

## 2014-12-30 NOTE — Telephone Encounter (Signed)
Called and made [facility] aware. Nothing further needed

## 2014-12-30 NOTE — Telephone Encounter (Signed)
That's ok  Ck total is fine

## 2015-01-04 ENCOUNTER — Institutional Professional Consult (permissible substitution): Payer: Medicare Other | Admitting: Internal Medicine

## 2015-01-04 DIAGNOSIS — M109 Gout, unspecified: Secondary | ICD-10-CM | POA: Diagnosis not present

## 2015-01-04 DIAGNOSIS — J189 Pneumonia, unspecified organism: Secondary | ICD-10-CM | POA: Diagnosis not present

## 2015-01-04 DIAGNOSIS — R0602 Shortness of breath: Secondary | ICD-10-CM | POA: Diagnosis not present

## 2015-01-04 DIAGNOSIS — J180 Bronchopneumonia, unspecified organism: Secondary | ICD-10-CM | POA: Diagnosis not present

## 2015-01-04 DIAGNOSIS — N183 Chronic kidney disease, stage 3 (moderate): Secondary | ICD-10-CM | POA: Diagnosis not present

## 2015-01-04 DIAGNOSIS — R062 Wheezing: Secondary | ICD-10-CM | POA: Diagnosis not present

## 2015-01-05 ENCOUNTER — Other Ambulatory Visit: Payer: Self-pay | Admitting: Adult Health

## 2015-01-05 DIAGNOSIS — J849 Interstitial pulmonary disease, unspecified: Secondary | ICD-10-CM

## 2015-01-05 DIAGNOSIS — R768 Other specified abnormal immunological findings in serum: Secondary | ICD-10-CM

## 2015-01-07 DIAGNOSIS — M109 Gout, unspecified: Secondary | ICD-10-CM | POA: Diagnosis not present

## 2015-01-07 DIAGNOSIS — I10 Essential (primary) hypertension: Secondary | ICD-10-CM | POA: Diagnosis not present

## 2015-01-07 DIAGNOSIS — N183 Chronic kidney disease, stage 3 (moderate): Secondary | ICD-10-CM | POA: Diagnosis not present

## 2015-01-07 DIAGNOSIS — N2581 Secondary hyperparathyroidism of renal origin: Secondary | ICD-10-CM | POA: Diagnosis not present

## 2015-01-11 ENCOUNTER — Encounter (HOSPITAL_COMMUNITY)
Admission: RE | Disposition: A | Discharge status: Home / Self Care | Payer: Self-pay | Source: Ambulatory Visit | Attending: Cardiology

## 2015-01-11 ENCOUNTER — Encounter (HOSPITAL_COMMUNITY): Payer: Self-pay | Admitting: Cardiology

## 2015-01-11 ENCOUNTER — Ambulatory Visit (HOSPITAL_COMMUNITY)
Admission: RE | Admit: 2015-01-11 | Discharge: 2015-01-11 | Disposition: A | Discharge status: Home / Self Care | Payer: Medicare Other | Source: Ambulatory Visit | Attending: Cardiology | Admitting: Cardiology

## 2015-01-11 DIAGNOSIS — Z87891 Personal history of nicotine dependence: Secondary | ICD-10-CM | POA: Diagnosis not present

## 2015-01-11 DIAGNOSIS — E785 Hyperlipidemia, unspecified: Secondary | ICD-10-CM | POA: Diagnosis not present

## 2015-01-11 DIAGNOSIS — I272 Other secondary pulmonary hypertension: Secondary | ICD-10-CM | POA: Diagnosis not present

## 2015-01-11 DIAGNOSIS — R0689 Other abnormalities of breathing: Secondary | ICD-10-CM

## 2015-01-11 DIAGNOSIS — R0602 Shortness of breath: Secondary | ICD-10-CM | POA: Diagnosis not present

## 2015-01-11 DIAGNOSIS — Z6836 Body mass index (BMI) 36.0-36.9, adult: Secondary | ICD-10-CM | POA: Diagnosis not present

## 2015-01-11 DIAGNOSIS — E119 Type 2 diabetes mellitus without complications: Secondary | ICD-10-CM | POA: Diagnosis not present

## 2015-01-11 DIAGNOSIS — I10 Essential (primary) hypertension: Secondary | ICD-10-CM | POA: Diagnosis not present

## 2015-01-11 DIAGNOSIS — R0600 Dyspnea, unspecified: Secondary | ICD-10-CM | POA: Diagnosis present

## 2015-01-11 DIAGNOSIS — E669 Obesity, unspecified: Secondary | ICD-10-CM | POA: Insufficient documentation

## 2015-01-11 HISTORY — PX: LEFT HEART CATHETERIZATION WITH CORONARY ANGIOGRAM: SHX5451

## 2015-01-11 SURGERY — LEFT HEART CATHETERIZATION WITH CORONARY ANGIOGRAM

## 2015-01-11 MED ORDER — VANCOMYCIN HCL IN DEXTROSE 1-5 GM/200ML-% IV SOLN
1000.0000 mg | INTRAVENOUS | Status: DC
  Filled 2015-01-11: qty 200

## 2015-01-11 MED ORDER — SODIUM CHLORIDE 0.9 % IV SOLN: INTRAVENOUS | Status: DC

## 2015-01-11 MED ORDER — ASPIRIN 81 MG PO CHEW
CHEWABLE_TABLET | ORAL | Status: AC
  Administered 2015-01-11: 81 mg via ORAL
  Filled 2015-01-11: qty 1

## 2015-01-11 MED ORDER — SODIUM CHLORIDE 0.9 % IV SOLN
Freq: Once | INTRAVENOUS | Status: AC
  Administered 2015-01-11: 09:00:00 via INTRAVENOUS

## 2015-01-11 MED ORDER — HYDROMORPHONE HCL 1 MG/ML IJ SOLN
INTRAMUSCULAR | Status: AC
  Filled 2015-01-11: qty 1

## 2015-01-11 MED ORDER — MIDAZOLAM HCL 2 MG/2ML IJ SOLN
INTRAMUSCULAR | Status: AC
  Filled 2015-01-11: qty 2

## 2015-01-11 MED ORDER — SODIUM CHLORIDE 0.9 % IJ SOLN: 3.0000 mL | INTRAMUSCULAR | Status: DC | PRN

## 2015-01-11 MED ORDER — NITROGLYCERIN 1 MG/10 ML FOR IR/CATH LAB
INTRA_ARTERIAL | Status: AC
  Filled 2015-01-11: qty 10

## 2015-01-11 MED ORDER — HEPARIN (PORCINE) IN NACL 2-0.9 UNIT/ML-% IJ SOLN
INTRAMUSCULAR | Status: AC
  Filled 2015-01-11: qty 1000

## 2015-01-11 MED ORDER — ASPIRIN 81 MG PO CHEW
81.0000 mg | CHEWABLE_TABLET | ORAL | Status: AC
  Administered 2015-01-11: 81 mg via ORAL

## 2015-01-11 MED ORDER — LIDOCAINE HCL (PF) 1 % IJ SOLN
INTRAMUSCULAR | Status: AC
  Filled 2015-01-11: qty 30

## 2015-01-11 MED ORDER — SODIUM CHLORIDE 0.9 % IJ SOLN: 3.0000 mL | Freq: Two times a day (BID) | INTRAMUSCULAR | Status: DC

## 2015-01-11 MED ORDER — SODIUM CHLORIDE 0.9 % IV SOLN: 250.0000 mL | INTRAVENOUS | Status: DC | PRN

## 2015-01-11 NOTE — H&P (Signed)
  Please see office visit notes for complete details of HPI.  

## 2015-01-11 NOTE — Discharge Instructions (Signed)

## 2015-01-11 NOTE — CV Procedure (Signed)
Procedures performed: Right and left heart catheterization and occlusion of cardiac output and cardiac index by Fick. Right femoral arterial access and right femoral vein access was utilized for performing the procedure. Right femoral arteriogram and closure of right femoral arterial access with Perclose.  Indication: Patient is a 74 year-old Caucasian male with diet-controlled diabetes, hyperlipidemia, hypertension,  moderate obesity, prior history of tobacco use disorder  Presenting with worsening dyspnea on exertion. Outpatient stress testing had revealed no evidence of ischemia, LVEF normal, however due to worsening symptoms of dyspnea, after evaluation by pulmonary medicine, it was felt that the dyspnea was out of proportion to his COPD.  Hence is brought to the cardiac catheterization lab to evaluate  coronary anatomy. Right heart catheterization being performed for evaluation of dyspnea and pulmonary hypertension and to evaluate cardiac output and cardiac index.  A 7 French right femoral sheath introduced into the right femoral vein access. A 6.5 French Swan-Ganz catheter was advanced with balloon inflated on the sheath under fluoroscopic guidance into first the right atrium followed by the right ventricle and into the pulmonary artery to pulmonary artery wedge position. Hemodynamics were obtained in a locations.  After hemodynamics were completed, samples were taken for SaO2% measurement to be used in [facility] /Index catheterization.  The catheter was then pulled back the balloon down and then completely out of the body.   Left Heart Catheterization   First a 5 French right femoral arterial access was obtained. A 5 French sheath was introduced. A 5 [place] multipurpose B2  catheter was advanced over standard J-wire into the ascending aorta and used to engage  Right Coronary Artery. Multiple cineangiographic views of the Right Coronary Artery system(s) were performed. I initially attempted to engage the  pain with MP B2, then JL 5 followed by AL-1 and then eventually utilized a JL 4.0 diagnostic catheter to engage the left main coronary artery and angiography was performed. During the process of angiography into the right coronary artery 200 g of nitroglycerin was also administered. Prior to pulling the MP B2 out of the body, LV gram was performed the RAO projection.Left ventriculography was then performed in the RAO projection. Hemodynamics were then resampled and the catheter pulled back across the aortic valve for measurement of "pullback" gradient. The catheter was then removed the body over wire. All exchanges were made over standard J wire.   Procedural data:  RA pressure 11/11  Mean 10 mm mercury. RA saturation 51 %.  RV pressure 32/2 and Right ventricular EDP 7 mm Hg. PA pressure 37/24 with a mean of 17  mm mercury. After 400 g of intrapulmonary arterial nitroglycerin administration, PA pressure was no significantly different,  PA saturation 65 %.  Pulmonary capillary wedge 12/11 with a mean of 10 mm Hg. Aortic saturation 95 %.  Cardiac output was 3.37 with cardiac index of 1.57  by Fick.   Angiographic data  Left ventricle:  performed.  Left systolic shows normal ejection fraction of 55-60 % . No wall motion of normality.  Right coronary artery: Smooth, proximal segment has 10-20% stenosis, otherwise dominant and gives origin to a very large PL branch. There was slow flow evident in the RCA, which improved slightly with intracoronary nitroglycerin administration.  Left main coronary artery: Normal. No stenosis.   LAD: Large, smooth and has very minimal luminal irregularity. Gives origin to a moderate sized diagonal 1 which has minimal luminal irregularity.   Circumflex coronary artery: Smooth normal.   Impression: Mild pulmonary hypertension,  no significant coronary artery disease and normal LVEF.  Recommendation: Evaluation for noncardiac causes of dyspnea is indicated. A total of  80 mL of contrast was utilized for diagnostic angiography.

## 2015-01-11 NOTE — Interval H&P Note (Signed)
History and Physical Interval Note:  01/11/2015 10:48 AM  Lenon Oms  has presented today for surgery, with the diagnosis of shortness of breath  The various methods of treatment have been discussed with the patient and family. After consideration of risks, benefits and other options for treatment, the patient has consented to  Procedure(s): LEFT HEART CATHETERIZATION WITH CORONARY ANGIOGRAM (N/A), right heart catheterization and possible PCI as a surgical intervention .  The patient's history has been reviewed, patient examined, no change in status, stable for surgery.  I have reviewed the patient's chart and labs.  Questions were answered to the patient's satisfaction.  Patient has had low risk stress test, preserved LVEF , but dyspnea on exertion was felt to be out of proportion to underlying lung disease. Hence patient patient scheduled for diagnostic angiogram and possible angioplasty along with right heart catheterization to evaluate for dyspnea.    [facility] R Cath Lab Visit (complete for each Cath Lab visit)  Clinical Evaluation Leading to the Procedure:   ACS: No.  Non-ACS:    Anginal Classification: CCS III  Anti-ischemic medical therapy: No Therapy  Non-Invasive Test Results: Low-risk stress test findings: cardiac mortality <1%/year  Prior CABG: No previous CABG

## 2015-01-13 LAB — POCT I-STAT 3, VENOUS BLOOD GAS (G3P V)
Bicarbonate: 26.4 mEq/L — ABNORMAL HIGH (ref 20.0–24.0)
Bicarbonate: 27 mEq/L — ABNORMAL HIGH (ref 20.0–24.0)
O2 SAT: 51 %
O2 Saturation: 65 %
PH VEN: 7.332 — AB (ref 7.250–7.300)
PH VEN: 7.338 — AB (ref 7.250–7.300)
PO2 VEN: 30 mmHg (ref 30.0–45.0)
TCO2: 29 mmol/L (ref 0–100)
TCO2: 28 mmol/L (ref 0–100)
pCO2, Ven: 49.8 mmHg (ref 45.0–50.0)
pCO2, Ven: 50.4 mmHg — ABNORMAL HIGH (ref 45.0–50.0)
pO2, Ven: 36 mmHg (ref 30.0–45.0)

## 2015-01-13 LAB — POCT I-STAT 3, ART BLOOD GAS (G3+)
ACID-BASE DEFICIT: 1 mmol/L (ref 0.0–2.0)
Bicarbonate: 25.2 mEq/L — ABNORMAL HIGH (ref 20.0–24.0)
O2 Saturation: 95 %
PCO2 ART: 44.9 mmHg (ref 35.0–45.0)
TCO2: 27 mmol/L (ref 0–100)
pH, Arterial: 7.357 (ref 7.350–7.450)
pO2, Arterial: 79 mmHg — ABNORMAL LOW (ref 80.0–100.0)

## 2015-01-18 DIAGNOSIS — M47816 Spondylosis without myelopathy or radiculopathy, lumbar region: Secondary | ICD-10-CM | POA: Diagnosis not present

## 2015-01-20 DIAGNOSIS — E1129 Type 2 diabetes mellitus with other diabetic kidney complication: Secondary | ICD-10-CM | POA: Diagnosis not present

## 2015-01-20 DIAGNOSIS — R0602 Shortness of breath: Secondary | ICD-10-CM | POA: Diagnosis not present

## 2015-01-20 DIAGNOSIS — I10 Essential (primary) hypertension: Secondary | ICD-10-CM | POA: Diagnosis not present

## 2015-01-20 DIAGNOSIS — G4733 Obstructive sleep apnea (adult) (pediatric): Secondary | ICD-10-CM | POA: Diagnosis not present

## 2015-02-04 DIAGNOSIS — J189 Pneumonia, unspecified organism: Secondary | ICD-10-CM | POA: Diagnosis not present

## 2015-02-04 DIAGNOSIS — R0602 Shortness of breath: Secondary | ICD-10-CM | POA: Diagnosis not present

## 2015-02-04 DIAGNOSIS — J180 Bronchopneumonia, unspecified organism: Secondary | ICD-10-CM | POA: Diagnosis not present

## 2015-02-04 DIAGNOSIS — R062 Wheezing: Secondary | ICD-10-CM | POA: Diagnosis not present

## 2015-02-08 ENCOUNTER — Encounter (INDEPENDENT_AMBULATORY_CARE_PROVIDER_SITE_OTHER): Payer: Self-pay

## 2015-02-08 ENCOUNTER — Ambulatory Visit (INDEPENDENT_AMBULATORY_CARE_PROVIDER_SITE_OTHER): Payer: Medicare Other | Admitting: Internal Medicine

## 2015-02-08 ENCOUNTER — Encounter: Payer: Self-pay | Admitting: Internal Medicine

## 2015-02-08 DIAGNOSIS — R0600 Dyspnea, unspecified: Secondary | ICD-10-CM

## 2015-02-08 DIAGNOSIS — J849 Interstitial pulmonary disease, unspecified: Secondary | ICD-10-CM | POA: Diagnosis not present

## 2015-02-08 DIAGNOSIS — R0689 Other abnormalities of breathing: Secondary | ICD-10-CM

## 2015-02-08 NOTE — Patient Instructions (Signed)
ICD-9-CM ICD-10-CM   1. Dyspnea and respiratory abnormality 786.09 R06.00 CANCELED: CT Chest High Resolution    R06.89 CANCELED: Pulmonary Function Test     CANCELED: Pulse oximetry, overnight  2. ILD (interstitial lung disease) 65 J84.9     You have interstitial lung disease Over 100 varieties of this Most likely variety that you have is either autoimmune interstitial lung disease or idiopathic pulmonary fibrosis or hypersensitivity pneumonitis or NSIP Off these most likely is idiopathic pulmonary fibrosis if autoimmune diseases ruled out  - we discussed IPF diease in detail  Plan Refer to Dr. Gavin Pound and rheumatology Full pulmonary function test in 8 weeks Return to see me in 8 weeks with full pulmonary function test  - based on above discuss treatment for IPF v lung biopsy

## 2015-02-08 NOTE — Progress Notes (Signed)
   Subjective:    Patient ID: Shawn Cruz, male    DOB: 01/14/1941, 74 y.o.   MRN: [id number]  HPI     OV 02/08/2015  Chief Complaint  Patient presents with  . Follow-up    Pt last saw TP in 2/16 to review CT and PFT. Pt stated his breathing is unchanged since last OV. Pt states he has infrequent cough. Pt c/o seasonal allergy issues. Pt denies CP/tightness.    Follow-up discussion of evaluation for interstitial lung disease. 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 Autoimmune panel is essentially negative except for trace positive ANA and cyclic citrulline peptide. However he denies any autoimmune symptoms though his wife has lupus. Pulmonary function test every 9 2060 shows an FVC of 2.6 L/65%, FEV1 of 2.16 L/75% and a ratio of 114%. Total lung capacity is 4.7 L/72% and a DLCO of 12.47/43%. Walking desaturation test back inJan/feb 2016 did not desaturate  Review of Systems  Constitutional: Negative for fever and unexpected weight change.  HENT: Positive for rhinorrhea. Negative for congestion, dental problem, ear pain, nosebleeds, postnasal drip, sinus pressure, sneezing, sore throat and trouble swallowing.   Eyes: Negative for redness and itching.  Respiratory: Positive for cough and shortness of breath. Negative for chest tightness and wheezing.   Cardiovascular: Negative for palpitations and leg swelling.  Gastrointestinal: Negative for nausea and vomiting.  Genitourinary: Negative for dysuria.  Musculoskeletal: Negative for joint swelling.  Skin: Negative for rash.  Neurological: Negative for headaches.  Hematological: Does not bruise/bleed easily.  Psychiatric/Behavioral: Negative for dysphoric mood. The patient is not nervous/anxious.        Objective:   Physical Exam   Filed Vitals:   02/08/15 1407  BP: 116/76  Pulse: 92  Height: 5\' 8"  (1.727 m)  Weight: 230 lb (104.327 kg)  SpO2: 93%   discussuion only visit      Assessment & Plan:     ICD-9-CM  ICD-10-CM   1. Dyspnea and respiratory abnormality 786.09 R06.00 CANCELED: CT Chest High Resolution    R06.89 CANCELED: Pulmonary Function Test     CANCELED: Pulse oximetry, overnight  2. 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 We discuss IPF therapy and its course and prognosis in detail He will need to lose weight Also referred to pulmonary rehabilitation   > 50% of this > 25 min visit spent in face to face counseling or coordination of care (15 min visit converted to 25 min)   Dr. Brand Males, M.D., [facility].C.P Pulmonary and Critical Care Medicine Staff Physician [place] [facility] Pager: [phone], If no answer or between  15:00h - 7:00h: call [phone]  02/08/2015 2:48 PM

## 2015-02-14 ENCOUNTER — Telehealth (HOSPITAL_COMMUNITY): Payer: Self-pay

## 2015-02-14 NOTE — Telephone Encounter (Signed)
I have called and left a message with [facility] to inquire about participation in Pulmonary Rehab per Dr. Golden Pop referral. Will send letter in mail and follow up.

## 2015-02-17 ENCOUNTER — Telehealth (HOSPITAL_COMMUNITY): Payer: Self-pay

## 2015-02-17 NOTE — Telephone Encounter (Signed)
I have called and left a message with Shawn Cruz's wife to inquire about participation in Pulmonary Rehab per Dr. Chase Caller referral. Wife states that he will follow up with me.

## 2015-02-17 NOTE — Telephone Encounter (Signed)
Called patient regarding entrance to Pulmonary Rehab.  Patient states that they are interested in attending the program.  Shawn Cruz is going to verify insurance coverage and follow up.    

## 2015-03-06 DIAGNOSIS — J189 Pneumonia, unspecified organism: Secondary | ICD-10-CM | POA: Diagnosis not present

## 2015-03-06 DIAGNOSIS — R062 Wheezing: Secondary | ICD-10-CM | POA: Diagnosis not present

## 2015-03-06 DIAGNOSIS — R0602 Shortness of breath: Secondary | ICD-10-CM | POA: Diagnosis not present

## 2015-03-10 ENCOUNTER — Telehealth (HOSPITAL_COMMUNITY): Payer: Self-pay

## 2015-03-10 NOTE — Telephone Encounter (Signed)
Called patient regarding entrance to Pulmonary Rehab.  Patient states that they are interested in attending the program but he has a 50.00/visit copay. This is not affordable for Shawn Cruz at this time. Encouraged patient to call back if his financial situation changes.

## 2015-03-28 DIAGNOSIS — R5383 Other fatigue: Secondary | ICD-10-CM | POA: Diagnosis not present

## 2015-03-28 DIAGNOSIS — M109 Gout, unspecified: Secondary | ICD-10-CM | POA: Diagnosis not present

## 2015-03-28 DIAGNOSIS — J849 Interstitial pulmonary disease, unspecified: Secondary | ICD-10-CM | POA: Diagnosis not present

## 2015-03-28 DIAGNOSIS — R0602 Shortness of breath: Secondary | ICD-10-CM | POA: Diagnosis not present

## 2015-03-29 ENCOUNTER — Ambulatory Visit (INDEPENDENT_AMBULATORY_CARE_PROVIDER_SITE_OTHER): Payer: Medicare Other | Admitting: Internal Medicine

## 2015-03-29 ENCOUNTER — Encounter: Payer: Self-pay | Admitting: Internal Medicine

## 2015-03-29 VITALS — BP 116/84 | HR 93 | Ht 68.0 in | Wt 233.0 lb

## 2015-03-29 DIAGNOSIS — J849 Interstitial pulmonary disease, unspecified: Secondary | ICD-10-CM | POA: Diagnosis not present

## 2015-03-29 LAB — PULMONARY FUNCTION TEST
DL/VA % pred: 76 %
DL/VA: 3.4 ml/min/mmHg/L
DLCO unc % pred: 45 %
DLCO unc: 13.24 ml/min/mmHg
FEF 25-75 Post: 2.82 L/sec
FEF 25-75 Pre: 2.38 L/sec
FEF2575-%Change-Post: 18 %
FEF2575-%PRED-PRE: 111 %
FEF2575-%Pred-Post: 132 %
FEV1-%Change-Post: 3 %
FEV1-%PRED-PRE: 67 %
FEV1-%Pred-Post: 70 %
FEV1-POST: 2 L
FEV1-PRE: 1.93 L
FEV1FVC-%CHANGE-POST: 5 %
FEV1FVC-%Pred-Pre: 113 %
FEV6-%Change-Post: -1 %
FEV6-%Pred-Post: 62 %
FEV6-%Pred-Pre: 62 %
FEV6-Post: 2.28 L
FEV6-Pre: 2.31 L
FEV6FVC-%CHANGE-POST: 0 %
FEV6FVC-%PRED-POST: 105 %
FEV6FVC-%Pred-Pre: 104 %
FVC-%CHANGE-POST: -1 %
FVC-%PRED-PRE: 59 %
FVC-%Pred-Post: 58 %
FVC-PRE: 2.34 L
FVC-Post: 2.29 L
PRE FEV1/FVC RATIO: 83 %
Post FEV1/FVC ratio: 87 %
Post FEV6/FVC ratio: 100 %
Pre FEV6/FVC Ratio: 99 %
RV % pred: 80 %
RV: 1.9 L
TLC % pred: 68 %
TLC: 4.45 L

## 2015-03-29 NOTE — Progress Notes (Signed)
PFT done today. 

## 2015-03-29 NOTE — Progress Notes (Signed)
Subjective:    Patient ID: Shawn Cruz, male    DOB: 11-08-1940, 74 y.o.   MRN: [id number]  HPI OV 02/08/2015  Chief Complaint  Patient presents with  . Follow-up    Pt last saw TP in 2/16 to review CT and PFT. Pt stated his breathing is unchanged since last OV. Pt states he has infrequent cough. Pt c/o seasonal allergy issues. Pt denies CP/tightness.    Follow-up discussion of evaluation for interstitial lung disease. 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 Autoimmune panel is essentially negative except for trace positive ANA and cyclic citrulline peptide. However he denies any autoimmune symptoms though his wife has lupus. Pulmonary function test every 9 2060 shows an FVC of 2.6 L/65%, FEV1 of 2.16 L/75% and a ratio of 114%. Total lung capacity is 4.7 L/72% and a DLCO of 12.47/43%. Walking desaturation test back inJan/feb 2016 did not desaturate  OV 03/29/2015  Chief Complaint  Patient presents with  . Follow-up    Pt stated his breathing is unchanged. Pt here after PFT to discuss further options.      Pulmonary function test today 03/29/2015 with an FVC of 2.34 L/59%, FEV1 of 1.9 L/67%, total lung capacity of 4.4 L/68% and a DLCO of 13.24/45%    d/w Dr Trudie Reed on telephone 03/30/15  - she feels that titres of CCO that is trace positive is within nomral. SHe is clinically not supsecting VAsculitis, or autoimmune disease or collagen vasc disease. She sent some additonal work up for scleroderma and she expects it to be negative. Will take 2 weeks to return. 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  HENT: Negative for congestion, dental problem, ear pain, nosebleeds, postnasal  drip, rhinorrhea, sinus pressure, sneezing, sore throat and trouble swallowing.   Eyes: Negative for redness and itching.  Respiratory: Positive for shortness of breath. Negative for cough, chest tightness and wheezing.   Cardiovascular: Negative for palpitations and leg swelling.  Gastrointestinal: Negative for nausea and vomiting.  Genitourinary: Negative for dysuria.  Musculoskeletal: Negative for joint swelling.  Skin: Negative for rash.  Neurological: Negative for headaches.  Hematological: Does not bruise/bleed easily.  Psychiatric/Behavioral: Negative for dysphoric mood. The patient is not nervous/anxious.        Objective:   Physical Exam  Filed Vitals:   03/29/15 1352  BP: 116/84  Pulse: 93  Height: 5\' 8"  (1.727 m)  Weight: 233 lb (105.688 kg)  SpO2: 96%   Discussion only visit      Assessment & Plan:     ICD-9-CM ICD-10-CM   1. ILD (interstitial lung disease) 515 J84.9    Discussed top differential diagnosis of autoimmune interstitial lung disease, NSIP and IPF  I will write to pulmonary rehabilitation about the high cost of co-pay for you I will talk to Dr. Gavin Pound about her estimation of probability of autoimmune lung disease I will take a second opinion from radiologist about the probability that l CT chest looks like IPF   Follow-up - I hope to call you back in one week - If at the end of the above we lean to his IPF we will initiate therapy - please  read the handout and decide between the 2 drugs of esbriet and ofev - If at the end of the above we lean towards biopsy - I will refer you to Dr. Raquel James  > 50% of this > 25 min visit spent in face to face counseling or coordination of care   Dr. Brand Males, M.D., [facility].C.P Pulmonary and Critical Care Medicine Staff Physician [place] [facility] Pager: [phone], If no answer or between  15:00h - 7:00h: call [phone]  03/29/2015 2:50  PM   Addendum:

## 2015-03-29 NOTE — Patient Instructions (Addendum)
ICD-9-CM ICD-10-CM   1. ILD (interstitial lung disease) 515 J84.9    I will write to pulmonary rehabilitation about the high cost of co-pay for you I will talk to Dr. Gavin Pound about her estimation of probability of autoimmune lung disease I will take a second opinion from radiologist about the probability that l CT chest looks like IPF   Follow-up - I hope to call you back in one week - If at the end of the above we lean to his IPF we will initiate therapy - please read the handout and decide between the 2 drugs of esbriet and ofev - If at the end of the above we lean towards biopsy - I will refer you to Dr. Raquel James

## 2015-03-30 ENCOUNTER — Telehealth: Payer: Self-pay | Admitting: Internal Medicine

## 2015-03-30 DIAGNOSIS — J849 Interstitial pulmonary disease, unspecified: Secondary | ICD-10-CM

## 2015-03-30 NOTE — Telephone Encounter (Signed)
Pt called stating he wants to proceed with lung biopsy.

## 2015-03-31 NOTE — Telephone Encounter (Signed)
Will forward to MR as FYI 

## 2015-04-01 NOTE — Telephone Encounter (Signed)
Shawn Cruz  I had counseled need for bx with [name] and risks of acute lung injury, BPF, infection, pain, sedation issues. He has thought about it and wants to proceed with biopsy. Please refer to Dr Servando Snare after memorial day weekend. I need to know date he  Is seeing Dr Servando Snare so I can discuss with Dr Servando Snare  Thanks  Dr. Brand Males, M.D., [facility].C.P Pulmonary and Critical Care Medicine Staff Physician [place] [facility] Pager: [phone], If no answer or between  15:00h - 7:00h: call [phone]  04/01/2015 9:50 AM

## 2015-04-01 NOTE — Telephone Encounter (Signed)
lmtcb for pt.  Order placed.  PCC's please advise when scheduled. Thanks.

## 2015-04-01 NOTE — Telephone Encounter (Signed)
Pt returned call - advised of [name]'s reason for calling him Pt stated that he is aware of pending appt with Dr. Servando Snare, had just received message shortly prior to calling the office Nothing further needed  MR - appt is scheduled with Dr Servando Snare for 6.6.16 @ 1230pm

## 2015-04-06 DIAGNOSIS — R062 Wheezing: Secondary | ICD-10-CM | POA: Diagnosis not present

## 2015-04-06 DIAGNOSIS — J189 Pneumonia, unspecified organism: Secondary | ICD-10-CM | POA: Diagnosis not present

## 2015-04-06 DIAGNOSIS — R0602 Shortness of breath: Secondary | ICD-10-CM | POA: Diagnosis not present

## 2015-04-11 ENCOUNTER — Other Ambulatory Visit: Payer: Self-pay | Admitting: *Deleted

## 2015-04-11 ENCOUNTER — Institutional Professional Consult (permissible substitution) (INDEPENDENT_AMBULATORY_CARE_PROVIDER_SITE_OTHER): Payer: Medicare Other | Admitting: Cardiothoracic Surgery

## 2015-04-11 ENCOUNTER — Encounter: Payer: Self-pay | Admitting: Cardiothoracic Surgery

## 2015-04-11 VITALS — BP 127/74 | HR 76 | Resp 16 | Ht 68.0 in | Wt 230.0 lb

## 2015-04-11 DIAGNOSIS — R0602 Shortness of breath: Secondary | ICD-10-CM | POA: Diagnosis not present

## 2015-04-11 DIAGNOSIS — J849 Interstitial pulmonary disease, unspecified: Secondary | ICD-10-CM | POA: Diagnosis not present

## 2015-04-11 NOTE — Progress Notes (Addendum)
[address]       [place] [address]             [phone]                    Jahvon D Guse [place] Medical Record #[id number] Date of Birth: Sep 17, 1941  Referring: Brand Males, MD Primary Care: Delia Chimes, NP  Chief Complaint:    Chief Complaint  Patient presents with  . Shortness of Breath    has ILD.Marland Kitcheneval for BX.Marland KitchenMarland KitchenCT CHEST @ [facility]..12/21/14...has been seen by cardiology and cathed 01/11/15    History of Present Illness:    CANON GOLA 74 y.o. male is seen in the office  today for consideration of VATS lung Biopsy. Patient was a long term smoker greater then 50 years 1 1 1/2 ppd. He quite 12 years ago after bout of severe lung infection. Now has increasing SOB with activity. Cardiac cath has been done and ct of chest. Pulmonary has seen for evaluation of ILD and referred for lung bx. 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 Autoimmune panel is essentially negative except for trace positive ANA and cyclic citrulline peptide. However he denies any autoimmune symptoms.  Pulmonary function test  shows an FVC of 2.6 L/65%, FEV1 of 2.16 L/75% and a ratio of 114%. Total lung capacity is 4.7 L/72% and a DLCO of 12.47/43%. Walking desaturation test back inJan/feb 2016 did not desaturate. Denies hemoptysis or wheezing.    Current Activity/ Functional Status:  Patient is independent with mobility/ambulation, transfers, ADL's, IADL's.   Zubrod Score: At the time of surgery this patient's most appropriate activity status/level should be described as: []     0    Normal activity, no symptoms [x]     1    Restricted in physical strenuous activity but ambulatory, able to do out light work []     2    Ambulatory and capable of self care, unable to do work activities, up and about               >50 % of waking hours                              []     3    Only limited self care, in bed greater than 50% of waking hours []     4    Completely disabled, no self  care, confined to bed or chair []     5    Moribund   Past Medical History  Diagnosis Date  . DDD (degenerative disc disease)     LS spine  . Hyperglycemia   . Hypertension   . Chronic kidney disease   . Diabetes mellitus   . High cholesterol     Past Surgical History  Procedure Laterality Date  . Lumbar discetomy      1977  . Rotator cuff repair      Dr. Durward Fortes  . Back surrgery      2013  . Left heart catheterization with coronary angiogram N/A 01/11/2015    Procedure: LEFT HEART CATHETERIZATION WITH CORONARY ANGIOGRAM;  Surgeon: Adrian Prows, MD;  [facility];  Service: Cardiovascular;  Laterality: N/A;    Family History  Problem Relation Age of Onset  . Hypertension Mother   . Heart attack Mother     age 39's  . Hypertension Father   . Heart  attack Father     age 89's  . Aortic aneurysm Sister     History   Social History  . Marital Status: Married    Spouse Name: N/A  . Number of Children: N/A  . Years of Education: N/A   Occupational History  . retired    Social History Main Topics  . Smoking status: Former Smoker -- 1.00 packs/day for 50 years    Types: Cigarettes    Quit date: 11/05/2005  . Smokeless tobacco: Former Systems developer    Types: Chew     Comment: Quit at age 72  . Alcohol Use: 0.0 oz/week    0 Standard drinks or equivalent per week     Comment: occass  . Drug Use: No  . Sexual Activity: Not on file   Other Topics Concern  . Not on file   Social History Narrative    History  Smoking status  . Former Smoker -- 1.00 packs/day for 50 years  . Types: Cigarettes  . Quit date: 11/05/2005  Smokeless tobacco  . Former Systems developer  . Types: Chew    Comment: Quit at age 27    History  Alcohol Use  . 0.0 oz/week  . 0 Standard drinks or equivalent per week    Comment: occass     Allergies  Allergen Reactions  . Sulfonamide Derivatives     REACTION: Age 4, leg swelling    Current Outpatient Prescriptions  Medication Sig Dispense  Refill  . allopurinol (ZYLOPRIM) 300 MG tablet Take 300 mg by mouth daily.    [name] aspirin 81 MG tablet Take 81 mg by mouth daily.      [name] atorvastatin (LIPITOR) 10 MG tablet Take 10 mg by mouth daily.    . chlorpheniramine (CHLOR-TRIMETON) 4 MG tablet Take 4 mg by mouth 2 (two) times daily as needed for allergies.    . Cholecalciferol (VITAMIN D) 2000 UNITS CAPS Take by mouth daily.      . citalopram (CELEXA) 20 MG tablet TAKE 1 TABLET BY MOUTH DAILY 90 tablet 0  . COLCRYS 0.6 MG tablet TAKE 2 TABLETS NOW AND 1 TABLET IN 1 HOUR. THEN TAKE 1 TABLET DAILY STARTING TOMORROW 30 tablet 0  . cyanocobalamin 100 MCG tablet Take 100 mcg by mouth daily.    . furosemide (LASIX) 40 MG tablet TAKE 1 TABLET BY MOUTH TWICE DAILY 60 tablet 0  . HYDROcodone-acetaminophen (NORCO) 10-325 MG per tablet Take 1 tablet by mouth every 4 (four) hours as needed. No more than 6 daily as needed     . polyethylene glycol (MIRALAX / GLYCOLAX) packet Take 17 g by mouth daily.     No current facility-administered medications for this visit.      Review of Systems:     Cardiac Review of Systems: Y or N  Chest Pain [  n  ]  Resting SOB [ n  ] Exertional SOB  Blue.Reese  ]  Orthopnea Florencio.Farrier  ]   Pedal Edema [ n  ]    Palpitations [n  ] Syncope  Florencio.Farrier  ]   Presyncope Florencio.Farrier   ]  General Review of Systems: [Y] = yes [  ]=no Constitional: recent weight change [n  ];  Wt loss over the last 3 months [   ] anorexia [  ]; fatigue [ y ]; nausea [  ]n; night sweats [n  ]; fever [ n ]; or chills [ n ];  Dental: poor dentition[  ]; Last Dentist visit:   Eye : blurred vision [  ]; diplopia [   ]; vision changes [  ];  Amaurosis fugax[  ]; Resp: cough [ y ];  wheezing[  n];  hemoptysis[n  ]; shortness of breath[y  ]; paroxysmal nocturnal dyspnea[ n ]; dyspnea on exertion[ y ]; or orthopnea[  ];  GI:  gallstones[  ], vomiting[  ];  dysphagia[  ]; melena[  ];  hematochezia [  ]; heartburn[  ];   Hx of  Colonoscopy[  ]; GU: kidney stones [  ];  hematuria[  ];   dysuria [  ];  nocturia[  ];  history of     obstruction [  ]; urinary frequency [  ]             Skin: rash, swelling[  ];, hair loss[  ];  peripheral edema[  ];  or itching[  ]; Musculosketetal: myalgias[  ];  joint swelling[  ];  joint erythema[  ];  joint pain[  ];  back pain[y  ];  Heme/Lymph: bruising[  ];  bleeding[  ];  anemia[  ];  Neuro: TIA[  ];  headaches[  ];  stroke[  ];  vertigo[  ];  seizures[  ];   paresthesias[y  ];  difficulty walking[  ];  Psych:depression[  ]; anxiety[  ];  Endocrine: diabetes[?  ];  thyroid dysfunction[n  ];  Immunizations: Flu up to date [ y ]; Pneumococcal up to date [ y ];  Other:  Physical Exam: BP 127/74 mmHg  Pulse 76  Resp 16  Ht 5\' 8"  (1.727 m)  Wt 230 lb (104.327 kg)  BMI 34.98 kg/m2  SpO2 93%  PHYSICAL EXAMINATION: General appearance: alert, cooperative, appears older than stated age and no distress Head: Normocephalic, without obvious abnormality, atraumatic Neck: no adenopathy, no carotid bruit, no JVD, supple, symmetrical, trachea midline and thyroid not enlarged, symmetric, no tenderness/mass/nodules Lymph nodes: Cervical, supraclavicular, and axillary nodes normal. Resp: mild crackles bilaterial Back: symmetric, no curvature. ROM normal. No CVA tenderness. Cardio: regular rate and rhythm, S1, S2 normal, no murmur, click, rub or gallop GI: soft, non-tender; bowel sounds normal; no masses,  no organomegaly Extremities: extremities normal, atraumatic, no cyanosis or edema and Homans sign is negative, no sign of DVT Neurologic: Grossly normal  Diagnostic Studies & Laboratory data:     Recent Radiology Findings:   CLINICAL DATA: 74 year old male with increasing shortness of breath during exertion since November 2015. Prior history of pneumonia in November treated with antibiotics. Shortness of breath.  EXAM: CT CHEST WITHOUT CONTRAST  TECHNIQUE: Multidetector CT imaging of the chest was performed following  the standard protocol without intravenous contrast. High resolution imaging of the lungs, as well as inspiratory and expiratory imaging, was performed.  COMPARISON: No priors.  FINDINGS: Mediastinum/Lymph Nodes: Heart size is normal. There is no significant pericardial fluid, thickening or pericardial calcification. There is atherosclerosis of the thoracic aorta, the great vessels of the mediastinum and the coronary arteries, including calcified atherosclerotic plaque in the left anterior descending and right coronary arteries. Multiple borderline enlarged mediastinal lymph nodes are nonspecific. No pathologically enlarged mediastinal or hilar lymph nodes. Esophagus is unremarkable in appearance. No axillary lymphadenopathy.  Lungs/Pleura: Patchy areas of very mild ground-glass attenuation are noted in a random distribution in the lungs bilaterally. Some of these areas are associated with subpleural and peribronchovascular reticulation which is rather mild. There is some thickening of the bronchi  and mild thickening of the peribronchovascular interstitium as well with some regional areas of mild architectural distortion. Mild cylindrical bronchiectasis is noted throughout the lungs bilaterally. There is 1 region in the periphery of the left upper lobe where there are some thick-walled cysts (image 7 of series 3), however, no other convincing regions of honeycombing are otherwise noted. No definitive craniocaudal gradient is noted. No acute consolidative airspace disease. No pleural effusions. No suspicious appearing pulmonary nodules or masses. Inspiratory and expiratory imaging demonstrates widespread air trapping, indicative of moderate to severe small airways disease.  Upper Abdomen: Patchy areas of low attenuation in the hepatic parenchyma, compatible with hepatic steatosis. 1.7 cm low-attenuation lesion in the upper pole of the right kidney is incompletely characterized  on today's noncontrast CT examination, but is statistically likely a cyst (a cyst was noted in this region on prior lumbar spine MRI 04/10/2008).  Musculoskeletal/Soft Tissues: There are no aggressive appearing lytic or blastic lesions noted in the visualized portions of the skeleton.  IMPRESSION: 1. 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 Strictly speaking, the possibility of very early usual interstitial pneumonia (UIP) is not entirely excluded, particularly given several thick walled cysts in the periphery of the left upper lobe, however, UIP is not favored at this time. Repeat high-resolution chest CT in 12 months would be useful to assess for temporal changes in the appearance of the lung parenchyma if clinically appropriate. 2. Atherosclerosis, including left anterior descending and right coronary artery disease. Assessment for potential risk factor modification, dietary therapy or pharmacologic therapy may be warranted, if clinically indicated. 3. Hepatic steatosis. 4. Additional incidental findings, as above.   Electronically Signed  By: Vinnie Langton M.D.  On: 12/22/2014 11:32 I have independently reviewed the above radiology studies  and reviewed the findings with the patient.  CARDIAC CATH: Left Heart Catheterization   First a 5 French right femoral arterial access was obtained. A 5 French sheath was introduced. A 5 [place] multipurpose B2 catheter was advanced over standard J-wire into the ascending aorta and used to engage Right Coronary Artery. Multiple cineangiographic views of the Right Coronary Artery system(s) were performed. I initially attempted to engage the pain with MP B2, then JL 5 followed by AL-1 and then eventually utilized a JL 4.0 diagnostic catheter to engage the left main coronary artery and angiography was performed.  During the process of angiography into the right coronary artery 200 g of nitroglycerin was also administered. Prior to pulling the MP B2 out of the body, LV gram was performed the RAO projection.Left ventriculography was then performed in the RAO projection. Hemodynamics were then resampled and the catheter pulled back across the aortic valve for measurement of "pullback" gradient. The catheter was then removed the body over wire. All exchanges were made over standard J wire.   Procedural data:  RA pressure 11/11 Mean 10 mm mercury. RA saturation 51 %.  RV pressure 32/2 and Right ventricular EDP 7 mm Hg. PA pressure 37/24 with a mean of 17 mm mercury. After 400 g of intrapulmonary arterial nitroglycerin administration, PA pressure was no significantly different, PA saturation 65 %.  Pulmonary capillary wedge 12/11 with a mean of 10 mm Hg. Aortic saturation 95 %.  Cardiac output was 3.37 with cardiac index of 1.57 by Fick.   Angiographic data  Left ventricle: performed. Left systolic shows normal ejection fraction of 55-60 % . No wall  motion of normality.  Right coronary artery: Smooth, proximal segment has 10-20% stenosis, otherwise dominant and gives origin to a very large PL branch. There was slow flow evident in the RCA, which improved slightly with intracoronary nitroglycerin administration.  Left main coronary artery: Normal. No stenosis.  LAD: Large, smooth and has very minimal luminal irregularity. Gives origin to a moderate sized diagonal 1 which has minimal luminal irregularity.   Circumflex coronary artery: Smooth normal.   Impression: Mild pulmonary hypertension, no significant coronary artery disease and normal LVEF.  Recommendation: Evaluation for noncardiac causes of dyspnea is indicated. A total of 80 mL of contrast was utilized for diagnostic angiography.           Recent Lab Findings: Lab Results  Component Value Date   WBC 11.8* 10/08/2013   HGB  13.4 10/08/2013   HCT 40.0 10/08/2013   PLT 226.0 10/08/2013   GLUCOSE 84 10/08/2013   CHOL 124 03/05/2013   TRIG 206.0* 03/05/2013   HDL 39.70 03/05/2013   LDLDIRECT 62.8 03/05/2013   LDLCALC 41 02/25/2012   ALT 26 03/05/2013   AST 31 03/05/2013   NA 139 10/08/2013   K 4.0 10/08/2013   CL 100 10/08/2013   CREATININE 1.9* 10/08/2013   BUN 33* 10/08/2013   CO2 25 10/08/2013   TSH 0.90 03/05/2013   HGBA1C 6.6* 03/05/2013    Assessment / Plan:   I have seen the patient and agree with proceeding with Bronchoscopy and left VATS with Lung Biopsy for ILS. Risks and options discussed in detail including the expected post op care and length of time in the hospital. Plan to proceed 04/13/2015 Chronic Kidney Disease , CR 1.9 on 10/08/2013, Patient reports recent visit with nephrology  Cr was "good"  I  spent 40 minutes counseling the patient face to face and 50% or more the  time was spent in counseling and coordination of care. The total time spent in the appointment was 60 minutes.  [name] MD      [address] [place] [address] Office [phone]   Beeper [phone]  04/11/2015 1:10 PM

## 2015-04-12 ENCOUNTER — Encounter (HOSPITAL_COMMUNITY): Payer: Self-pay

## 2015-04-12 ENCOUNTER — Other Ambulatory Visit: Payer: Self-pay

## 2015-04-12 ENCOUNTER — Encounter (HOSPITAL_COMMUNITY)
Admission: RE | Admit: 2015-04-12 | Discharge: 2015-04-12 | Disposition: A | Discharge status: Home / Self Care | Payer: Medicare Other | Source: Ambulatory Visit | Attending: Cardiothoracic Surgery | Admitting: Cardiothoracic Surgery

## 2015-04-12 ENCOUNTER — Ambulatory Visit (HOSPITAL_COMMUNITY)
Admission: RE | Admit: 2015-04-12 | Discharge: 2015-04-12 | Disposition: A | Discharge status: Home / Self Care | Payer: Medicare Other | Source: Ambulatory Visit | Attending: Cardiothoracic Surgery | Admitting: Cardiothoracic Surgery

## 2015-04-12 VITALS — BP 113/69 | HR 104 | Temp 97.8°F | Resp 18 | Ht 68.0 in | Wt 230.5 lb

## 2015-04-12 DIAGNOSIS — N183 Chronic kidney disease, stage 3 (moderate): Secondary | ICD-10-CM | POA: Diagnosis not present

## 2015-04-12 DIAGNOSIS — J849 Interstitial pulmonary disease, unspecified: Secondary | ICD-10-CM | POA: Diagnosis not present

## 2015-04-12 DIAGNOSIS — I272 Other secondary pulmonary hypertension: Secondary | ICD-10-CM | POA: Diagnosis not present

## 2015-04-12 DIAGNOSIS — M109 Gout, unspecified: Secondary | ICD-10-CM | POA: Diagnosis not present

## 2015-04-12 DIAGNOSIS — E119 Type 2 diabetes mellitus without complications: Secondary | ICD-10-CM | POA: Diagnosis not present

## 2015-04-12 DIAGNOSIS — I2510 Atherosclerotic heart disease of native coronary artery without angina pectoris: Secondary | ICD-10-CM | POA: Diagnosis not present

## 2015-04-12 DIAGNOSIS — I129 Hypertensive chronic kidney disease with stage 1 through stage 4 chronic kidney disease, or unspecified chronic kidney disease: Secondary | ICD-10-CM | POA: Diagnosis not present

## 2015-04-12 DIAGNOSIS — F419 Anxiety disorder, unspecified: Secondary | ICD-10-CM | POA: Diagnosis not present

## 2015-04-12 DIAGNOSIS — M797 Fibromyalgia: Secondary | ICD-10-CM | POA: Diagnosis not present

## 2015-04-12 DIAGNOSIS — Z87891 Personal history of nicotine dependence: Secondary | ICD-10-CM | POA: Diagnosis not present

## 2015-04-12 DIAGNOSIS — K219 Gastro-esophageal reflux disease without esophagitis: Secondary | ICD-10-CM | POA: Diagnosis not present

## 2015-04-12 HISTORY — DX: Sleep apnea, unspecified: G47.30

## 2015-04-12 HISTORY — DX: Gout, unspecified: M10.9

## 2015-04-12 HISTORY — DX: Anxiety disorder, unspecified: F41.9

## 2015-04-12 HISTORY — DX: Gastro-esophageal reflux disease without esophagitis: K21.9

## 2015-04-12 HISTORY — DX: Reserved for inherently not codable concepts without codable children: IMO0001

## 2015-04-12 HISTORY — DX: Fibromyalgia: M79.7

## 2015-04-12 LAB — URINALYSIS, ROUTINE W REFLEX MICROSCOPIC
Bilirubin Urine: NEGATIVE
Glucose, UA: NEGATIVE mg/dL
Hgb urine dipstick: NEGATIVE
Ketones, ur: NEGATIVE mg/dL
Leukocytes, UA: NEGATIVE
Nitrite: NEGATIVE
Protein, ur: NEGATIVE mg/dL
Specific Gravity, Urine: 1.009 (ref 1.005–1.030)
Urobilinogen, UA: 0.2 mg/dL (ref 0.0–1.0)
pH: 6 (ref 5.0–8.0)

## 2015-04-12 LAB — BLOOD GAS, ARTERIAL
Acid-base deficit: 0.9 mmol/L (ref 0.0–2.0)
Bicarbonate: 23 mEq/L (ref 20.0–24.0)
Drawn by: 42180
FIO2: 0.21 %
O2 Saturation: 95.8 %
Patient temperature: 98.6
TCO2: 24.1 mmol/L (ref 0–100)
pCO2 arterial: 36.5 mmHg (ref 35.0–45.0)
pH, Arterial: 7.415 (ref 7.350–7.450)
pO2, Arterial: 76.6 mmHg — ABNORMAL LOW (ref 80.0–100.0)

## 2015-04-12 LAB — COMPREHENSIVE METABOLIC PANEL
ALT: 24 U/L (ref 17–63)
AST: 29 U/L (ref 15–41)
Albumin: 3.7 g/dL (ref 3.5–5.0)
Alkaline Phosphatase: 77 U/L (ref 38–126)
Anion gap: 17 — ABNORMAL HIGH (ref 5–15)
BUN: 24 mg/dL — ABNORMAL HIGH (ref 6–20)
CO2: 21 mmol/L — ABNORMAL LOW (ref 22–32)
Calcium: 9.2 mg/dL (ref 8.9–10.3)
Chloride: 98 mmol/L — ABNORMAL LOW (ref 101–111)
Creatinine, Ser: 1.45 mg/dL — ABNORMAL HIGH (ref 0.61–1.24)
GFR calc Af Amer: 54 mL/min — ABNORMAL LOW (ref >60)
GFR calc non Af Amer: 47 mL/min — ABNORMAL LOW (ref >60)
Glucose, Bld: 144 mg/dL — ABNORMAL HIGH (ref 65–99)
Potassium: 3.5 mmol/L (ref 3.5–5.1)
Sodium: 136 mmol/L (ref 135–145)
Total Bilirubin: 0.5 mg/dL (ref 0.3–1.2)
Total Protein: 7.9 g/dL (ref 6.5–8.1)

## 2015-04-12 LAB — TYPE AND SCREEN
ABO/RH(D): O POS
Antibody Screen: NEGATIVE

## 2015-04-12 LAB — SURGICAL PCR SCREEN
MRSA, PCR: NEGATIVE
Staphylococcus aureus: NEGATIVE

## 2015-04-12 LAB — PROTIME-INR
INR: 1.03 (ref 0.00–1.49)
Prothrombin Time: 13.7 seconds (ref 11.6–15.2)

## 2015-04-12 LAB — ABO/RH: ABO/RH(D): O POS

## 2015-04-12 LAB — APTT: aPTT: 22 seconds — ABNORMAL LOW (ref 24–37)

## 2015-04-12 MED ORDER — CEFUROXIME SODIUM 1.5 G IJ SOLR
1.5000 g | INTRAMUSCULAR | Status: AC
  Administered 2015-04-13: 1.5 g via INTRAVENOUS
  Filled 2015-04-12: qty 1.5

## 2015-04-12 NOTE — Progress Notes (Signed)
Anesthesia Chart Review:  Patient is a 74 year old male scheduled for video bronchoscopy, left VATS, lung biopsy tomorrow by Dr. Servando Snare. DX: ILD. PAT was earlier today.  I given chart to review after he had already left the hospital. He was just seen by Dr. Servando Snare yesterday.  History includes former smoker, hypercholesterolemia, HTN (only on furosemide), DM2 (diet controlled), SOB, anxiety, gout, GERD, arthritis, fibromyalgia, nephrolithiasis, multiple back surgeries. Dr. Everrett Coombe note also mentions a history of CKD. PCP is listed as Delia Chimes, NP.  Cardiologist is Dr. Adrian Prows with [facility]. Pulmonologist is Dr. Chase Caller.  Meds include allopurinol, ASA 81 mg, Lipitor, chlorpheniramine, Vitamin D, Celexa, Colcrys, cyanocobalamin, Lasix, Norco, Miralax.  04/12/15 EKG computer interpretation was read as afib at 99 bpm with PVCs.  However, rhythm appeared regular except for the PVCs. I was having difficulty seeing any p-waves and wondered if he was in accelerated junctional rhythm.  I reviewed with anesthesiologist Dr. Tobias Alexander who recommended showing his EKG to Dr. Einar Gip. Fortunately, Dr. Einar Gip was in the hospital today, so I notified him of plans for surgery tomorrow and had him review the EKG.  He felt it was SR with first degree AVB (P wave falling just after the T wave).  Patient had a previous first degree AVB on his 01/11/15 EKG.  Rate is faster now.  Dr. Einar Gip had no additional recommendations.   11/21/14 Echo [facility]): LV cavity is normal in size. Moderate concentric LVH. Normal global wall motion. Doppler evidence of grade 1 (impaired) diastolic dysfunction. Calculated EF 54%. LA cavity is mildly dilated. Trace MR/TR/PR.  11/22/14 Nuclear stress test [facility]) showed medium sized scar from the base towards the apex with no significant peri-infarct ischemia. Mild inferior wall hypokinesis, LVED 51%. He subsequently underwent cardiac cath on 01/11/15 that showed:  Procedural data:  RA  pressure 11/11 Mean 10 mm mercury. RA saturation 51 %.  RV pressure 32/2 and Right ventricular EDP 7 mm Hg. PA pressure 37/24 with a mean of 17 mm mercury. After 400 g of intrapulmonary arterial nitroglycerin administration, PA pressure was no significantly different, PA saturation 65 %.  Pulmonary capillary wedge 12/11 with a mean of 10 mm Hg. Aortic saturation 95 %.  Cardiac output was 3.37 with cardiac index of 1.57 by Fick.  Angiographic data  Left ventricle: performed. Left systolic shows normal ejection fraction of 55-60 % . No wall motion of normality. Right coronary artery: Smooth, proximal segment has 10-20% stenosis, otherwise dominant and gives origin to a very large PL branch. There was slow flow evident in the RCA, which improved slightly with intracoronary nitroglycerin administration. Left main coronary artery: Normal. No stenosis. LAD: Large, smooth and has very minimal luminal irregularity. Gives origin to a moderate sized diagonal 1 which has minimal luminal irregularity.  Circumflex coronary artery: Smooth normal.  Impression: Mild pulmonary hypertension, no significant coronary artery disease and normal LVEF. Recommendation: Evaluation for noncardiac causes of dyspnea is indicated. (Dr. 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 Total lung capacity is 4.45/68% and a DLCO of 13.24/45%. Walking desaturation test back inJan/Feb 2016 did not desaturate.  Preoperative labs noted. BUN 24, Cr 1.45, PT/INR WNL, PTT 22. Glucose 144. CBC specimen clotted, so will need to be redrawn on arrival.   I routed an priority staff message to Dr. Servando Snare and TCTS RN Thurmond Butts earlier this afternoon notifying them of need to repeat  CBC and that Dr. Einar Gip had reviewed today's EKG.  If follow-up labs are stable and no acute changes then I would anticipate that he could proceed as planned.  George Hugh [facility]/Anesthesiology Phone [phone] 04/12/2015 4:20 PM

## 2015-04-12 NOTE — Pre-Procedure Instructions (Signed)
Shawn Cruz  04/12/2015      [place], [place] [address] [place] [address] Phone: [phone] Fax: [phone]  [id number] - [place], [address] AT [facility] [address] [place] [address] Phone: [phone] Fax: [phone]    Your procedure is scheduled on June 8th, Wednesday   Report to [facility] Admitting at 6:30 AM   Call this number if you have problems the morning of surgery:  [phone]   Remember:  Do not eat food or drink liquids (that includes water) after midnight tonight.   Take these medicines the morning of surgery with A SIP OF WATER : Celexa, Hydrocodone   Do not wear jewelry - no rings or watches.  Do not wear lotions or colognes.   You may NOT wear deodorant the day of surgery.                                                                                                Men may shave face and neck.   Do not bring valuables to the hospital.  [facility] is not responsible for any belongings or valuables.  Contacts, dentures or bridgework may not be worn into surgery.  Leave your suitcase in the car.  After surgery it may be brought to your room.  For patients admitted to the hospital, discharge time will be determined by your treatment team.  Name and phone number of your driver:     Special instructions:  "Preparing for Surgery" instruction sheet.  Please read over the following fact sheets that you were given. Pain Booklet, Coughing and Deep Breathing, Blood Transfusion Information, MRSA Information and Surgical Site Infection Prevention

## 2015-04-12 NOTE — Progress Notes (Addendum)
PCP is Dr Delia Chimes Have requested Jan 2016 echo & Stress tests done at Dr. Victorino December office  [phone] EKG done today which reads "A-Fib".. Have alerted A. Zelenak, PA to new findings.  DA CBC had clotted.   New sample to be drawn tomorrow.  DA

## 2015-04-13 ENCOUNTER — Encounter (HOSPITAL_COMMUNITY): Payer: Self-pay

## 2015-04-13 ENCOUNTER — Inpatient Hospital Stay (HOSPITAL_COMMUNITY): Payer: Medicare Other

## 2015-04-13 ENCOUNTER — Inpatient Hospital Stay (HOSPITAL_COMMUNITY): Payer: Medicare Other | Admitting: Vascular Surgery

## 2015-04-13 ENCOUNTER — Encounter (HOSPITAL_COMMUNITY)
Admission: RE | Disposition: A | Discharge status: Home Health | Payer: Self-pay | Source: Ambulatory Visit | Attending: Cardiothoracic Surgery

## 2015-04-13 ENCOUNTER — Inpatient Hospital Stay (HOSPITAL_COMMUNITY)
Admission: RE | Admit: 2015-04-13 | Discharge: 2015-04-17 | DRG: 168 | Disposition: A | Discharge status: Home Health | Payer: Medicare Other | Source: Ambulatory Visit | Attending: Cardiothoracic Surgery | Admitting: Cardiothoracic Surgery

## 2015-04-13 ENCOUNTER — Inpatient Hospital Stay (HOSPITAL_COMMUNITY): Payer: Medicare Other | Admitting: Anesthesiology

## 2015-04-13 DIAGNOSIS — M109 Gout, unspecified: Secondary | ICD-10-CM | POA: Diagnosis present

## 2015-04-13 DIAGNOSIS — Z9689 Presence of other specified functional implants: Secondary | ICD-10-CM

## 2015-04-13 DIAGNOSIS — Z452 Encounter for adjustment and management of vascular access device: Secondary | ICD-10-CM | POA: Diagnosis not present

## 2015-04-13 DIAGNOSIS — Z87891 Personal history of nicotine dependence: Secondary | ICD-10-CM

## 2015-04-13 DIAGNOSIS — M797 Fibromyalgia: Secondary | ICD-10-CM | POA: Diagnosis not present

## 2015-04-13 DIAGNOSIS — R05 Cough: Secondary | ICD-10-CM | POA: Diagnosis not present

## 2015-04-13 DIAGNOSIS — I2510 Atherosclerotic heart disease of native coronary artery without angina pectoris: Secondary | ICD-10-CM | POA: Diagnosis present

## 2015-04-13 DIAGNOSIS — K219 Gastro-esophageal reflux disease without esophagitis: Secondary | ICD-10-CM | POA: Diagnosis present

## 2015-04-13 DIAGNOSIS — N183 Chronic kidney disease, stage 3 (moderate): Secondary | ICD-10-CM | POA: Diagnosis not present

## 2015-04-13 DIAGNOSIS — I272 Other secondary pulmonary hypertension: Secondary | ICD-10-CM | POA: Diagnosis present

## 2015-04-13 DIAGNOSIS — I129 Hypertensive chronic kidney disease with stage 1 through stage 4 chronic kidney disease, or unspecified chronic kidney disease: Secondary | ICD-10-CM | POA: Diagnosis present

## 2015-04-13 DIAGNOSIS — J849 Interstitial pulmonary disease, unspecified: Secondary | ICD-10-CM | POA: Diagnosis not present

## 2015-04-13 DIAGNOSIS — J9811 Atelectasis: Secondary | ICD-10-CM | POA: Diagnosis not present

## 2015-04-13 DIAGNOSIS — F419 Anxiety disorder, unspecified: Secondary | ICD-10-CM | POA: Diagnosis present

## 2015-04-13 DIAGNOSIS — R918 Other nonspecific abnormal finding of lung field: Secondary | ICD-10-CM | POA: Diagnosis not present

## 2015-04-13 DIAGNOSIS — N189 Chronic kidney disease, unspecified: Secondary | ICD-10-CM | POA: Diagnosis not present

## 2015-04-13 DIAGNOSIS — R0602 Shortness of breath: Secondary | ICD-10-CM | POA: Diagnosis not present

## 2015-04-13 DIAGNOSIS — Z9889 Other specified postprocedural states: Secondary | ICD-10-CM

## 2015-04-13 DIAGNOSIS — Z4682 Encounter for fitting and adjustment of non-vascular catheter: Secondary | ICD-10-CM

## 2015-04-13 DIAGNOSIS — E119 Type 2 diabetes mellitus without complications: Secondary | ICD-10-CM | POA: Diagnosis not present

## 2015-04-13 HISTORY — PX: VIDEO BRONCHOSCOPY: SHX5072

## 2015-04-13 HISTORY — PX: LUNG BIOPSY: SHX5088

## 2015-04-13 HISTORY — PX: VIDEO ASSISTED THORACOSCOPY: SHX5073

## 2015-04-13 LAB — GLUCOSE, CAPILLARY
Glucose-Capillary: 111 mg/dL — ABNORMAL HIGH (ref 65–99)
Glucose-Capillary: 111 mg/dL — ABNORMAL HIGH (ref 65–99)
Glucose-Capillary: 137 mg/dL — ABNORMAL HIGH (ref 65–99)
Glucose-Capillary: 160 mg/dL — ABNORMAL HIGH (ref 65–99)

## 2015-04-13 LAB — POCT I-STAT 7, (LYTES, BLD GAS, ICA,H+H)
Bicarbonate: 27.6 mEq/L — ABNORMAL HIGH (ref 20.0–24.0)
Calcium, Ion: 1.12 mmol/L — ABNORMAL LOW (ref 1.13–1.30)
HCT: 41 % (ref 39.0–52.0)
Hemoglobin: 13.9 g/dL (ref 13.0–17.0)
O2 Saturation: 100 %
Patient temperature: 36.1
Potassium: 3.7 mmol/L (ref 3.5–5.1)
Sodium: 139 mmol/L (ref 135–145)
TCO2: 29 mmol/L (ref 0–100)
pCO2 arterial: 54.8 mmHg — ABNORMAL HIGH (ref 35.0–45.0)
pH, Arterial: 7.306 — ABNORMAL LOW (ref 7.350–7.450)
pO2, Arterial: 185 mmHg — ABNORMAL HIGH (ref 80.0–100.0)

## 2015-04-13 SURGERY — BRONCHOSCOPY, VIDEO-ASSISTED
Anesthesia: General | Site: Chest

## 2015-04-13 MED ORDER — EPHEDRINE SULFATE 50 MG/ML IJ SOLN
INTRAMUSCULAR | Status: AC
  Filled 2015-04-13: qty 1

## 2015-04-13 MED ORDER — FENTANYL 10 MCG/ML IV SOLN
INTRAVENOUS | Status: DC
Start: 2015-04-13 — End: 2015-04-15
  Administered 2015-04-13: 90 ug via INTRAVENOUS
  Administered 2015-04-13: 120 ug via INTRAVENOUS
  Administered 2015-04-14: 110 ug via INTRAVENOUS
  Administered 2015-04-14: 150 ug via INTRAVENOUS
  Administered 2015-04-14: 20 ug via INTRAVENOUS
  Administered 2015-04-14: 30 ug via INTRAVENOUS
  Administered 2015-04-14: 140 ug via INTRAVENOUS
  Administered 2015-04-14: 08:00:00 via INTRAVENOUS
  Administered 2015-04-14: 70 ug via INTRAVENOUS
  Administered 2015-04-15: 10 ug via INTRAVENOUS
  Administered 2015-04-15: 40 ug via INTRAVENOUS
  Administered 2015-04-15: 07:00:00 via INTRAVENOUS
  Administered 2015-04-15: 110 ug via INTRAVENOUS
  Filled 2015-04-13 (×3): qty 50

## 2015-04-13 MED ORDER — PROPOFOL 10 MG/ML IV BOLUS
INTRAVENOUS | Status: AC
  Filled 2015-04-13: qty 20

## 2015-04-13 MED ORDER — MIDAZOLAM HCL 10 MG/2ML IJ SOLN
INTRAMUSCULAR | Status: AC
  Filled 2015-04-13: qty 2

## 2015-04-13 MED ORDER — FENTANYL CITRATE (PF) 250 MCG/5ML IJ SOLN
INTRAMUSCULAR | Status: AC
  Filled 2015-04-13: qty 5

## 2015-04-13 MED ORDER — IPRATROPIUM BROMIDE 0.02 % IN SOLN
2.5000 mL | Freq: Four times a day (QID) | RESPIRATORY_TRACT | Status: DC
  Administered 2015-04-13 (×2): 0.5 mg via RESPIRATORY_TRACT
  Filled 2015-04-13 (×2): qty 2.5

## 2015-04-13 MED ORDER — CETYLPYRIDINIUM CHLORIDE 0.05 % MT LIQD
7.0000 mL | Freq: Two times a day (BID) | OROMUCOSAL | Status: DC
  Administered 2015-04-13 (×2): 7 mL via OROMUCOSAL

## 2015-04-13 MED ORDER — MIDAZOLAM HCL 5 MG/ML IJ SOLN
INTRAMUSCULAR | Status: DC | PRN
  Administered 2015-04-13: 1 mg via INTRAVENOUS
  Administered 2015-04-13: 2 mg via INTRAVENOUS

## 2015-04-13 MED ORDER — NEOSTIGMINE METHYLSULFATE 10 MG/10ML IV SOLN
INTRAVENOUS | Status: AC
  Filled 2015-04-13: qty 1

## 2015-04-13 MED ORDER — ROCURONIUM BROMIDE 100 MG/10ML IV SOLN
INTRAVENOUS | Status: DC | PRN
  Administered 2015-04-13 (×2): 30 mg via INTRAVENOUS
  Administered 2015-04-13: 20 mg via INTRAVENOUS

## 2015-04-13 MED ORDER — GLYCOPYRROLATE 0.2 MG/ML IJ SOLN
INTRAMUSCULAR | Status: AC
  Filled 2015-04-13: qty 3

## 2015-04-13 MED ORDER — VITAMIN B-12 100 MCG PO TABS
100.0000 ug | ORAL_TABLET | Freq: Every day | ORAL | Status: DC
  Administered 2015-04-14 – 2015-04-17 (×4): 100 ug via ORAL
  Filled 2015-04-13 (×5): qty 1

## 2015-04-13 MED ORDER — LACTATED RINGERS IV SOLN
INTRAVENOUS | Status: DC | PRN
  Administered 2015-04-13 (×3): via INTRAVENOUS

## 2015-04-13 MED ORDER — ARTIFICIAL TEARS OP OINT
TOPICAL_OINTMENT | OPHTHALMIC | Status: AC
  Filled 2015-04-13: qty 3.5

## 2015-04-13 MED ORDER — ATORVASTATIN CALCIUM 10 MG PO TABS
10.0000 mg | ORAL_TABLET | Freq: Every day | ORAL | Status: DC
  Administered 2015-04-13 – 2015-04-16 (×4): 10 mg via ORAL
  Filled 2015-04-13 (×5): qty 1

## 2015-04-13 MED ORDER — ONDANSETRON HCL 4 MG/2ML IJ SOLN: 4.0000 mg | Freq: Four times a day (QID) | INTRAMUSCULAR | Status: DC | PRN

## 2015-04-13 MED ORDER — LIDOCAINE HCL (CARDIAC) 20 MG/ML IV SOLN
INTRAVENOUS | Status: AC
  Filled 2015-04-13: qty 5

## 2015-04-13 MED ORDER — SUCCINYLCHOLINE CHLORIDE 20 MG/ML IJ SOLN
INTRAMUSCULAR | Status: DC | PRN
  Administered 2015-04-13: 120 mg via INTRAVENOUS

## 2015-04-13 MED ORDER — SODIUM CHLORIDE 0.9 % IJ SOLN
INTRAMUSCULAR | Status: AC
  Filled 2015-04-13: qty 10

## 2015-04-13 MED ORDER — ONDANSETRON HCL 4 MG/2ML IJ SOLN
INTRAMUSCULAR | Status: DC | PRN
  Administered 2015-04-13: 4 mg via INTRAVENOUS

## 2015-04-13 MED ORDER — GLYCOPYRROLATE 0.2 MG/ML IJ SOLN
INTRAMUSCULAR | Status: DC | PRN
  Administered 2015-04-13: .8 mg via INTRAVENOUS

## 2015-04-13 MED ORDER — DIPHENHYDRAMINE HCL 50 MG/ML IJ SOLN: 12.5000 mg | Freq: Four times a day (QID) | INTRAMUSCULAR | Status: DC | PRN

## 2015-04-13 MED ORDER — FUROSEMIDE 40 MG PO TABS
40.0000 mg | ORAL_TABLET | Freq: Two times a day (BID) | ORAL | Status: DC
  Administered 2015-04-14 – 2015-04-17 (×7): 40 mg via ORAL
  Filled 2015-04-13 (×10): qty 1

## 2015-04-13 MED ORDER — LEVALBUTEROL HCL 0.63 MG/3ML IN NEBU
0.6300 mg | INHALATION_SOLUTION | Freq: Four times a day (QID) | RESPIRATORY_TRACT | Status: DC
  Administered 2015-04-13 (×2): 0.63 mg via RESPIRATORY_TRACT
  Filled 2015-04-13 (×2): qty 3

## 2015-04-13 MED ORDER — GLYCOPYRROLATE 0.2 MG/ML IJ SOLN
INTRAMUSCULAR | Status: AC
  Filled 2015-04-13: qty 1

## 2015-04-13 MED ORDER — OXYCODONE HCL 5 MG PO TABS
ORAL_TABLET | ORAL | Status: AC
  Filled 2015-04-13: qty 2

## 2015-04-13 MED ORDER — NALOXONE HCL 0.4 MG/ML IJ SOLN: 0.4000 mg | INTRAMUSCULAR | Status: DC | PRN

## 2015-04-13 MED ORDER — POTASSIUM CHLORIDE 10 MEQ/50ML IV SOLN: 10.0000 meq | Freq: Every day | INTRAVENOUS | Status: DC | PRN

## 2015-04-13 MED ORDER — DEXTROSE-NACL 5-0.45 % IV SOLN
INTRAVENOUS | Status: DC
  Administered 2015-04-13: 100 mL/h via INTRAVENOUS
  Administered 2015-04-14: via INTRAVENOUS

## 2015-04-13 MED ORDER — ONDANSETRON HCL 4 MG/2ML IJ SOLN
INTRAMUSCULAR | Status: AC
  Filled 2015-04-13: qty 2

## 2015-04-13 MED ORDER — PHENYLEPHRINE HCL 10 MG/ML IJ SOLN
INTRAMUSCULAR | Status: DC | PRN
  Administered 2015-04-13: 80 ug via INTRAVENOUS

## 2015-04-13 MED ORDER — EPHEDRINE SULFATE 50 MG/ML IJ SOLN
INTRAMUSCULAR | Status: DC | PRN
  Administered 2015-04-13: 5 mg via INTRAVENOUS

## 2015-04-13 MED ORDER — DIPHENHYDRAMINE HCL 12.5 MG/5ML PO ELIX
12.5000 mg | ORAL_SOLUTION | Freq: Four times a day (QID) | ORAL | Status: DC | PRN
  Administered 2015-04-13: 12.5 mg via ORAL
  Filled 2015-04-13 (×3): qty 5

## 2015-04-13 MED ORDER — SODIUM CHLORIDE 0.9 % IJ SOLN
9.0000 mL | INTRAMUSCULAR | Status: DC | PRN
  Administered 2015-04-15: 10 mL via INTRAVENOUS
  Filled 2015-04-13: qty 9

## 2015-04-13 MED ORDER — HYDROMORPHONE HCL 1 MG/ML IJ SOLN
INTRAMUSCULAR | Status: AC
  Filled 2015-04-13: qty 1

## 2015-04-13 MED ORDER — DEXTROSE 5 % IV SOLN
1.5000 g | Freq: Two times a day (BID) | INTRAVENOUS | Status: AC
  Administered 2015-04-13 – 2015-04-14 (×2): 1.5 g via INTRAVENOUS
  Filled 2015-04-13 (×2): qty 1.5

## 2015-04-13 MED ORDER — ACETAMINOPHEN 500 MG PO TABS
1000.0000 mg | ORAL_TABLET | Freq: Four times a day (QID) | ORAL | Status: DC
  Administered 2015-04-13 – 2015-04-15 (×7): 1000 mg via ORAL
  Filled 2015-04-13 (×11): qty 2

## 2015-04-13 MED ORDER — BISACODYL 5 MG PO TBEC
10.0000 mg | DELAYED_RELEASE_TABLET | Freq: Every day | ORAL | Status: DC
  Administered 2015-04-14 – 2015-04-17 (×4): 10 mg via ORAL
  Filled 2015-04-13 (×4): qty 2

## 2015-04-13 MED ORDER — INSULIN ASPART 100 UNIT/ML ~~LOC~~ SOLN
0.0000 [IU] | SUBCUTANEOUS | Status: DC
  Administered 2015-04-13 – 2015-04-14 (×5): 2 [IU] via SUBCUTANEOUS

## 2015-04-13 MED ORDER — ROCURONIUM BROMIDE 50 MG/5ML IV SOLN
INTRAVENOUS | Status: AC
  Filled 2015-04-13: qty 1

## 2015-04-13 MED ORDER — FENTANYL CITRATE (PF) 250 MCG/5ML IJ SOLN
INTRAMUSCULAR | Status: DC | PRN
  Administered 2015-04-13 (×3): 50 ug via INTRAVENOUS
  Administered 2015-04-13: 100 ug via INTRAVENOUS
  Administered 2015-04-13 (×2): 50 ug via INTRAVENOUS

## 2015-04-13 MED ORDER — HYDROMORPHONE HCL 1 MG/ML IJ SOLN
INTRAMUSCULAR | Status: AC
  Administered 2015-04-13: 0.5 mg via INTRAVENOUS
  Filled 2015-04-13: qty 1

## 2015-04-13 MED ORDER — PROPOFOL 10 MG/ML IV BOLUS
INTRAVENOUS | Status: DC | PRN
  Administered 2015-04-13: 140 mg via INTRAVENOUS

## 2015-04-13 MED ORDER — METOCLOPRAMIDE HCL 5 MG/ML IJ SOLN
10.0000 mg | Freq: Four times a day (QID) | INTRAMUSCULAR | Status: AC
  Administered 2015-04-13 – 2015-04-14 (×3): 10 mg via INTRAVENOUS
  Filled 2015-04-13 (×3): qty 2

## 2015-04-13 MED ORDER — PHENYLEPHRINE HCL 10 MG/ML IJ SOLN
10.0000 mg | INTRAMUSCULAR | Status: DC | PRN
  Administered 2015-04-13: 20 ug/min via INTRAVENOUS

## 2015-04-13 MED ORDER — VITAMIN D3 25 MCG (1000 UNIT) PO TABS
1000.0000 [IU] | ORAL_TABLET | Freq: Every day | ORAL | Status: DC
  Administered 2015-04-14 – 2015-04-17 (×4): 1000 [IU] via ORAL
  Filled 2015-04-13 (×5): qty 1

## 2015-04-13 MED ORDER — ACETAMINOPHEN 160 MG/5ML PO SOLN: 1000.0000 mg | Freq: Four times a day (QID) | ORAL | Status: DC

## 2015-04-13 MED ORDER — NEOSTIGMINE METHYLSULFATE 10 MG/10ML IV SOLN
INTRAVENOUS | Status: DC | PRN
  Administered 2015-04-13: 5 mg via INTRAVENOUS

## 2015-04-13 MED ORDER — HYDROMORPHONE HCL 1 MG/ML IJ SOLN
0.5000 mg | INTRAMUSCULAR | Status: DC | PRN
  Administered 2015-04-13 (×2): 0.5 mg via INTRAVENOUS
  Administered 2015-04-13: 0.25 mg via INTRAVENOUS

## 2015-04-13 MED ORDER — OXYCODONE HCL 5 MG PO TABS
5.0000 mg | ORAL_TABLET | ORAL | Status: DC | PRN
  Administered 2015-04-13 – 2015-04-16 (×10): 10 mg via ORAL
  Administered 2015-04-16: 5 mg via ORAL
  Administered 2015-04-16: 10 mg via ORAL
  Administered 2015-04-16: 5 mg via ORAL
  Administered 2015-04-17: 10 mg via ORAL
  Filled 2015-04-13 (×4): qty 2
  Filled 2015-04-13: qty 1
  Filled 2015-04-13 (×3): qty 2
  Filled 2015-04-13: qty 1
  Filled 2015-04-13 (×4): qty 2

## 2015-04-13 MED ORDER — TRAMADOL HCL 50 MG PO TABS: 50.0000 mg | ORAL_TABLET | Freq: Four times a day (QID) | ORAL | Status: DC | PRN

## 2015-04-13 MED ORDER — CITALOPRAM HYDROBROMIDE 20 MG PO TABS
20.0000 mg | ORAL_TABLET | Freq: Every day | ORAL | Status: DC
  Administered 2015-04-14 – 2015-04-17 (×4): 20 mg via ORAL
  Filled 2015-04-13 (×5): qty 1

## 2015-04-13 MED ORDER — ASPIRIN 81 MG PO CHEW
81.0000 mg | CHEWABLE_TABLET | Freq: Every day | ORAL | Status: DC
  Administered 2015-04-14 – 2015-04-17 (×4): 81 mg via ORAL
  Filled 2015-04-13 (×4): qty 1

## 2015-04-13 MED ORDER — ALLOPURINOL 300 MG PO TABS
300.0000 mg | ORAL_TABLET | Freq: Every day | ORAL | Status: DC
  Administered 2015-04-14 – 2015-04-17 (×4): 300 mg via ORAL
  Filled 2015-04-13 (×5): qty 1

## 2015-04-13 MED ORDER — 0.9 % SODIUM CHLORIDE (POUR BTL) OPTIME
TOPICAL | Status: DC | PRN
  Administered 2015-04-13: 1000 mL

## 2015-04-13 MED ORDER — SENNOSIDES-DOCUSATE SODIUM 8.6-50 MG PO TABS
1.0000 | ORAL_TABLET | Freq: Every day | ORAL | Status: DC
  Administered 2015-04-13 – 2015-04-16 (×4): 1 via ORAL
  Filled 2015-04-13 (×5): qty 1

## 2015-04-13 SURGICAL SUPPLY — 85 items
ADH SKN CLS APL DERMABOND .7 (GAUZE/BANDAGES/DRESSINGS) ×3
APL SRG 22X2 LUM MLBL SLNT (VASCULAR PRODUCTS)
APL SRG 7X2 LUM MLBL SLNT (VASCULAR PRODUCTS)
APPLICATOR TIP COSEAL (VASCULAR PRODUCTS) IMPLANT
APPLICATOR TIP EXT COSEAL (VASCULAR PRODUCTS) IMPLANT
BAG SPEC RTRVL LRG 6X4 10 (ENDOMECHANICALS) ×3
BLADE SURG 11 STRL SS (BLADE) IMPLANT
BRUSH CYTOL CELLEBRITY 1.5X140 (MISCELLANEOUS) IMPLANT
CANISTER SUCTION 2500CC (MISCELLANEOUS) ×4 IMPLANT
CATH KIT ON Q 5IN SLV (PAIN MANAGEMENT) IMPLANT
CATH THORACIC 28FR (CATHETERS) IMPLANT
CATH THORACIC 36FR (CATHETERS) IMPLANT
CATH THORACIC 36FR RT ANG (CATHETERS) IMPLANT
CLEANER TIP ELECTROSURG 2X2 (MISCELLANEOUS) ×4 IMPLANT
CLIP TI MEDIUM 6 (CLIP) IMPLANT
CONN ST 1/4X3/8  BEN (MISCELLANEOUS) ×1
CONN ST 1/4X3/8 BEN (MISCELLANEOUS) IMPLANT
CONT SPEC 4OZ CLIKSEAL STRL BL (MISCELLANEOUS) ×9 IMPLANT
COVER TABLE BACK 60X90 (DRAPES) ×4 IMPLANT
CUTTER ECHEON FLEX ENDO 45 340 (ENDOMECHANICALS) ×1 IMPLANT
DERMABOND ADVANCED (GAUZE/BANDAGES/DRESSINGS) ×1
DERMABOND ADVANCED .7 DNX12 (GAUZE/BANDAGES/DRESSINGS) IMPLANT
DRAIN CHANNEL 28F RND 3/8 FF (WOUND CARE) ×1 IMPLANT
DRAPE LAPAROSCOPIC ABDOMINAL (DRAPES) ×4 IMPLANT
DRAPE WARM FLUID 44X44 (DRAPE) ×4 IMPLANT
DRILL BIT 7/64X5 (BIT) IMPLANT
ELECT BLADE 4.0 EZ CLEAN MEGAD (MISCELLANEOUS) ×4
ELECT REM PT RETURN 9FT ADLT (ELECTROSURGICAL) ×4
ELECTRODE BLDE 4.0 EZ CLN MEGD (MISCELLANEOUS) ×3 IMPLANT
ELECTRODE REM PT RTRN 9FT ADLT (ELECTROSURGICAL) ×3 IMPLANT
FORCEPS BIOP RJ4 1.8 (CUTTING FORCEPS) IMPLANT
GAUZE SPONGE 4X4 12PLY STRL (GAUZE/BANDAGES/DRESSINGS) ×4 IMPLANT
GLOVE BIO SURGEON STRL SZ 6.5 (GLOVE) ×9 IMPLANT
GLOVE BIOGEL PI IND STRL 7.0 (GLOVE) IMPLANT
GLOVE BIOGEL PI INDICATOR 7.0 (GLOVE) ×5
GOWN STRL REUS W/ TWL LRG LVL3 (GOWN DISPOSABLE) ×9 IMPLANT
GOWN STRL REUS W/TWL LRG LVL3 (GOWN DISPOSABLE) ×12
KIT BASIN OR (CUSTOM PROCEDURE TRAY) ×4 IMPLANT
KIT CLEAN ENDO COMPLIANCE (KITS) ×4 IMPLANT
KIT ROOM TURNOVER OR (KITS) ×4 IMPLANT
KIT SUCTION CATH 14FR (SUCTIONS) ×4 IMPLANT
MARKER SKIN DUAL TIP RULER LAB (MISCELLANEOUS) ×4 IMPLANT
NDL BIOPSY TRANSBRONCH 21G (NEEDLE) IMPLANT
NEEDLE BIOPSY TRANSBRONCH 21G (NEEDLE) IMPLANT
NS IRRIG 1000ML POUR BTL (IV SOLUTION) ×8 IMPLANT
OIL SILICONE PENTAX (PARTS (SERVICE/REPAIRS)) ×4 IMPLANT
PACK CHEST (CUSTOM PROCEDURE TRAY) ×4 IMPLANT
PAD ARMBOARD 7.5X6 YLW CONV (MISCELLANEOUS) ×8 IMPLANT
PASSER SUT SWANSON 36MM LOOP (INSTRUMENTS) IMPLANT
POUCH SPECIMEN RETRIEVAL 10MM (ENDOMECHANICALS) ×1 IMPLANT
RELOAD GOLD ECHELON 45 (STAPLE) ×3 IMPLANT
SCISSORS LAP 5X35 DISP (ENDOMECHANICALS) ×1 IMPLANT
SEALANT PROGEL (MISCELLANEOUS) IMPLANT
SEALANT SURG COSEAL 4ML (VASCULAR PRODUCTS) IMPLANT
SEALANT SURG COSEAL 8ML (VASCULAR PRODUCTS) IMPLANT
SOLUTION ANTI FOG 6CC (MISCELLANEOUS) ×4 IMPLANT
SPONGE GAUZE 4X4 12PLY STER LF (GAUZE/BANDAGES/DRESSINGS) ×1 IMPLANT
SUT PROLENE 3 0 SH DA (SUTURE) IMPLANT
SUT PROLENE 4 0 RB 1 (SUTURE)
SUT PROLENE 4-0 RB1 .5 CRCL 36 (SUTURE) IMPLANT
SUT SILK  1 MH (SUTURE) ×2
SUT SILK 1 MH (SUTURE) ×6 IMPLANT
SUT SILK 2 0SH CR/8 30 (SUTURE) IMPLANT
SUT SILK 3 0SH CR/8 30 (SUTURE) IMPLANT
SUT VIC AB 1 CTX 18 (SUTURE) IMPLANT
SUT VIC AB 1 CTX 36 (SUTURE)
SUT VIC AB 1 CTX36XBRD ANBCTR (SUTURE) IMPLANT
SUT VIC AB 2-0 CTX 36 (SUTURE) IMPLANT
SUT VIC AB 2-0 UR6 27 (SUTURE) ×2 IMPLANT
SUT VIC AB 3-0 X1 27 (SUTURE) IMPLANT
SUT VICRYL 2 TP 1 (SUTURE) IMPLANT
SWAB COLLECTION DEVICE MRSA (MISCELLANEOUS) IMPLANT
SYR 20ML ECCENTRIC (SYRINGE) ×4 IMPLANT
SYSTEM SAHARA CHEST DRAIN ATS (WOUND CARE) ×4 IMPLANT
TAPE CLOTH 4X10 WHT NS (GAUZE/BANDAGES/DRESSINGS) ×4 IMPLANT
TIP APPLICATOR SPRAY EXTEND 16 (VASCULAR PRODUCTS) IMPLANT
TOWEL OR 17X24 6PK STRL BLUE (TOWEL DISPOSABLE) ×4 IMPLANT
TOWEL OR 17X26 10 PK STRL BLUE (TOWEL DISPOSABLE) ×8 IMPLANT
TRAP SPECIMEN MUCOUS 40CC (MISCELLANEOUS) ×4 IMPLANT
TRAY FOLEY CATH 14FRSI W/METER (CATHETERS) ×4 IMPLANT
TRAY FOLEY CATH 16FRSI W/METER (SET/KITS/TRAYS/PACK) ×1 IMPLANT
TROCAR XCEL BLUNT TIP 100MML (ENDOMECHANICALS) ×1 IMPLANT
TUBE ANAEROBIC SPECIMEN COL (MISCELLANEOUS) IMPLANT
TUBE CONNECTING 20X1/4 (TUBING) ×8 IMPLANT
WATER STERILE IRR 1000ML POUR (IV SOLUTION) ×8 IMPLANT

## 2015-04-13 NOTE — Anesthesia Preprocedure Evaluation (Signed)
Anesthesia Evaluation  Patient identified by MRN, date of birth, ID band Patient awake    Reviewed: Allergy & Precautions, NPO status , Patient's Chart, lab work & pertinent test results  Airway Mallampati: II  TM Distance: >3 FB Neck ROM: Full    Dental no notable dental hx.    Pulmonary shortness of breath, sleep apnea , pneumonia -, former smoker,  breath sounds clear to auscultation  Pulmonary exam normal       Cardiovascular hypertension, Pt. on medications Normal cardiovascular examRhythm:Regular Rate:Normal     Neuro/Psych PSYCHIATRIC DISORDERS Anxiety negative neurological ROS     GI/Hepatic Neg liver ROS, GERD-  ,  Endo/Other  negative endocrine ROSdiabetes  Renal/GU Renal disease     Musculoskeletal  (+) Arthritis -, Fibromyalgia -  Abdominal   Peds  Hematology negative hematology ROS (+) anemia ,   Anesthesia Other Findings   Reproductive/Obstetrics negative OB ROS                             Anesthesia Physical Anesthesia Plan  ASA: III  Anesthesia Plan: General   Post-op Pain Management:    Induction: Intravenous  Airway Management Planned: Oral ETT  Additional Equipment:   Intra-op Plan:   Post-operative Plan: Extubation in OR  Informed Consent: I have reviewed the patients History and Physical, chart, labs and discussed the procedure including the risks, benefits and alternatives for the proposed anesthesia with the patient or authorized representative who has indicated his/her understanding and acceptance.   Dental advisory given  Plan Discussed with: CRNA  Anesthesia Plan Comments:         Anesthesia Quick Evaluation

## 2015-04-13 NOTE — H&P (Signed)
[address]       [place] [address]             [phone]                    [place] Medical Record #[id number] Date of Birth: [birth year]  Referring:Ramaswamy, Belva Crome, [place] Care: Delia Chimes, NP Cardiology Dr Einar Gip  Chief Complaint:    Abnormal CT of chest and SOB   History of Present Illness:    Shawn Cruz 74 y.o. male is seen in the office  today for consideration of VATS lung Biopsy. Patient was a long term smoker greater then 50 years 1 1 1/2 ppd. He quite 12 years ago after bout of severe lung infection. Now has increasing SOB with activity. Cardiac cath has been done and ct of chest. Pulmonary has seen for evaluation of ILD and referred for lung bx. 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 Autoimmune panel is essentially negative except for trace positive ANA and cyclic citrulline peptide. However he denies any autoimmune symptoms.  Pulmonary function test  shows an FVC of 2.6 L/65%, FEV1 of 2.16 L/75% and a ratio of 114%. Total lung capacity is 4.7 L/72% and a DLCO of 12.47/43%. Walking desaturation test back inJan/feb 2016 did not desaturate. Denies hemoptysis or wheezing.    Current Activity/ Functional Status:  Patient is independent with mobility/ambulation, transfers, ADL's, IADL's.   Zubrod Score: At the time of surgery this patient's most appropriate activity status/level should be described as: []     0    Normal activity, no symptoms [x]     1    Restricted in physical strenuous activity but ambulatory, able to do out light work []     2    Ambulatory and capable of self care, unable to do work activities, up and about               >50 % of waking hours                              []     3    Only limited self care, in bed greater than 50% of waking hours []     4    Completely disabled, no self care, confined to bed or chair []     5    Moribund   Past Medical History  Diagnosis Date  . DDD (degenerative  disc disease)     LS spine  . Hyperglycemia   . High cholesterol   . Hypertension     history of....not taking anything now  . Shortness of breath dyspnea   . Diabetes mellitus     diet controlled  takes no meds  . Sleep apnea     tested "yrs ago" ...unable to tolerate mask....PCP Toy Cookey is aware  . Anxiety   . GERD (gastroesophageal reflux disease)     takes tums occasionally for indigestion  . Fibromyalgia   . Gout   . Chronic kidney disease     has kidney stones  "galore"  Last attack was 8-10 yrs ago    Past Surgical History  Procedure Laterality Date  . Lumbar discetomy      1977  . Rotator cuff repair      Dr. Durward Fortes  . Back surrgery      2013  . Left heart catheterization with coronary angiogram N/A  01/11/2015    Procedure: LEFT HEART CATHETERIZATION WITH CORONARY ANGIOGRAM;  Surgeon: Adrian Prows, MD;  Location: [facility] CATH LAB;  Service: Cardiovascular;  Laterality: N/A;  . Back surgery      total of 3 surgeries  . Lithotripsy    . Removal of kidney stone      thru surgery  . Cardiac catheterization      01/2015  . Eye surgery      bilateral cataracts  . Hernia repair      upper ventral hernia (had as child)    Family History  Problem Relation Age of Onset  . Hypertension Mother   . Heart attack Mother     age 50's  . Hypertension Father   . Heart attack Father     age 39's  . Aortic aneurysm Sister     History   Social History  . Marital Status: Married    Spouse Name: N/A  . Number of Children: N/A  . Years of Education: N/A   Occupational History  . retired    Social History Main Topics  . Smoking status: Former Smoker -- 1.00 packs/day for 50 years    Types: Cigarettes    Quit date: 11/05/2005  . Smokeless tobacco: Former Systems developer    Types: Chew     Comment: Quit at age 50  . Alcohol Use: 0.6 oz/week    1 Cans of beer, 0 Standard drinks or equivalent per week     Comment: occass  . Drug Use: No  . Sexual Activity: Not on file   Other  Topics Concern  . Not on file   Social History Narrative    History  Smoking status  . Former Smoker -- 1.00 packs/day for 50 years  . Types: Cigarettes  . Quit date: 11/05/2005  Smokeless tobacco  . Former Systems developer  . Types: Chew    Comment: Quit at age 56    History  Alcohol Use  . 0.6 oz/week  . 1 Cans of beer, 0 Standard drinks or equivalent per week    Comment: occass     Allergies  Allergen Reactions  . Sulfonamide Derivatives     REACTION: Age 67, leg swelling    Current Facility-Administered Medications  Medication Dose Route Frequency Provider Last Rate Last Dose  . cefUROXime (ZINACEF) 1.5 g in dextrose 5 % 50 mL IVPB  1.5 g Intravenous To OR [name], MD          Review of Systems:     Cardiac Review of Systems: Y or N  Chest Pain [  n  ]  Resting SOB [ n  ] Exertional SOB  Blue.Reese  ]  Vertell Limber Florencio.Farrier  ]   Pedal Edema [ n  ]    Palpitations [n  ] Syncope  Florencio.Farrier  ]   Presyncope [n   ]  General Review of Systems: [Y] = yes [  ]=no Constitional: recent weight change [n  ];  Wt loss over the last 3 months [   ] anorexia [  ]; fatigue [ y ]; nausea [  ]n; night sweats [n  ]; fever [ n ]; or chills [ n ];          Dental: poor dentition[  ]; Last Dentist visit:   Eye : blurred vision [  ]; diplopia [   ]; vision changes [  ];  Amaurosis fugax[  ]; Resp: cough [ y ];  wheezing[  n];  hemoptysis[n  ]; shortness of breath[y  ]; paroxysmal nocturnal dyspnea[ n ]; dyspnea on exertion[ y ]; or orthopnea[  ];  GI:  gallstones[  ], vomiting[  ];  dysphagia[  ]; melena[  ];  hematochezia [  ]; heartburn[  ];   Hx of  Colonoscopy[  ]; GU: kidney stones [  ]; hematuria[  ];   dysuria [  ];  nocturia[  ];  history of     obstruction [  ]; urinary frequency [  ]             Skin: rash, swelling[  ];, hair loss[  ];  peripheral edema[  ];  or itching[  ]; Musculosketetal: myalgias[  ];  joint swelling[  ];  joint erythema[  ];  joint pain[  ];  back pain[y  ];  Heme/Lymph:  bruising[  ];  bleeding[  ];  anemia[  ];  Neuro: TIA[  ];  headaches[  ];  stroke[  ];  vertigo[  ];  seizures[  ];   paresthesias[y  ];  difficulty walking[  ];  Psych:depression[  ]; anxiety[  ];  Endocrine: diabetes[?  ];  thyroid dysfunction[n  ];  Immunizations: Flu up to date [ y ]; Pneumococcal up to date [ y ];  Other:  Physical Exam: There were no vitals taken for this visit.  PHYSICAL EXAMINATION: General appearance: alert, cooperative, appears older than stated age and no distress Head: Normocephalic, without obvious abnormality, atraumatic Neck: no adenopathy, no carotid bruit, no JVD, supple, symmetrical, trachea midline and thyroid not enlarged, symmetric, no tenderness/mass/nodules Lymph nodes: Cervical, supraclavicular, and axillary nodes normal. Resp: mild crackles bilaterial Back: symmetric, no curvature. ROM normal. No CVA tenderness. Cardio: regular rate and rhythm, S1, S2 normal, no murmur, click, rub or gallop GI: soft, non-tender; bowel sounds normal; no masses,  no organomegaly Extremities: extremities normal, atraumatic, no cyanosis or edema and Homans sign is negative, no sign of DVT Neurologic: Grossly normal  Diagnostic Studies & Laboratory data:     Recent Radiology Findings:   CLINICAL DATA: 74 year old male with increasing shortness of breath during exertion since November 2015. Prior history of pneumonia in November treated with antibiotics. Shortness of breath.  EXAM: CT CHEST WITHOUT CONTRAST  TECHNIQUE: Multidetector CT imaging of the chest was performed following the standard protocol without intravenous contrast. High resolution imaging of the lungs, as well as inspiratory and expiratory imaging, was performed.  COMPARISON: No priors.  FINDINGS: Mediastinum/Lymph Nodes: Heart size is normal. There is no significant pericardial fluid, thickening or pericardial calcification. There is atherosclerosis of the thoracic aorta,  the great vessels of the mediastinum and the coronary arteries, including calcified atherosclerotic plaque in the left anterior descending and right coronary arteries. Multiple borderline enlarged mediastinal lymph nodes are nonspecific. No pathologically enlarged mediastinal or hilar lymph nodes. Esophagus is unremarkable in appearance. No axillary lymphadenopathy.  Lungs/Pleura: Patchy areas of very mild ground-glass attenuation are noted in a random distribution in the lungs bilaterally. Some of these areas are associated with subpleural and peribronchovascular reticulation which is rather mild. There is some thickening of the bronchi and mild thickening of the peribronchovascular interstitium as well with some regional areas of mild architectural distortion. Mild cylindrical bronchiectasis is noted throughout the lungs bilaterally. There is 1 region in the periphery of the left upper lobe where there are some thick-walled cysts (image 7 of series 3), however, no other convincing regions of honeycombing are otherwise noted. No  definitive craniocaudal gradient is noted. No acute consolidative airspace disease. No pleural effusions. No suspicious appearing pulmonary nodules or masses. Inspiratory and expiratory imaging demonstrates widespread air trapping, indicative of moderate to severe small airways disease.  Upper Abdomen: Patchy areas of low attenuation in the hepatic parenchyma, compatible with hepatic steatosis. 1.7 cm low-attenuation lesion in the upper pole of the right kidney is incompletely characterized on today's noncontrast CT examination, but is statistically likely a cyst (a cyst was noted in this region on prior lumbar spine MRI 04/10/2008).  Musculoskeletal/Soft Tissues: There are no aggressive appearing lytic or blastic lesions noted in the visualized portions of the skeleton.  IMPRESSION: 1. 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 Strictly speaking, the possibility of very early usual interstitial pneumonia (UIP) is not entirely excluded, particularly given several thick walled cysts in the periphery of the left upper lobe, however, UIP is not favored at this time. Repeat high-resolution chest CT in 12 months would be useful to assess for temporal changes in the appearance of the lung parenchyma if clinically appropriate. 2. Atherosclerosis, including left anterior descending and right coronary artery disease. Assessment for potential risk factor modification, dietary therapy or pharmacologic therapy may be warranted, if clinically indicated. 3. Hepatic steatosis. 4. Additional incidental findings, as above.   Electronically Signed  By: Vinnie Langton M.D.  On: 12/22/2014 11:32 I have independently reviewed the above radiology studies  and reviewed the findings with the patient.  CARDIAC CATH: Left Heart Catheterization   First a 5 French right femoral arterial access was obtained. A 5 French sheath was introduced. A 5 [place] multipurpose B2 catheter was advanced over standard J-wire into the ascending aorta and used to engage Right Coronary Artery. Multiple cineangiographic views of the Right Coronary Artery system(s) were performed. I initially attempted to engage the pain with MP B2, then JL 5 followed by AL-1 and then eventually utilized a JL 4.0 diagnostic catheter to engage the left main coronary artery and angiography was performed. During the process of angiography into the right coronary artery 200 g of nitroglycerin was also administered. Prior to pulling the MP B2 out of the body, LV gram was performed the RAO projection.Left ventriculography was then performed in the RAO projection. Hemodynamics were then resampled and the catheter pulled back across the aortic valve for measurement of  "pullback" gradient. The catheter was then removed the body over wire. All exchanges were made over standard J wire.   Procedural data:  RA pressure 11/11 Mean 10 mm mercury. RA saturation 51 %.  RV pressure 32/2 and Right ventricular EDP 7 mm Hg. PA pressure 37/24 with a mean of 17 mm mercury. After 400 g of intrapulmonary arterial nitroglycerin administration, PA pressure was no significantly different, PA saturation 65 %.  Pulmonary capillary wedge 12/11 with a mean of 10 mm Hg. Aortic saturation 95 %.  Cardiac output was 3.37 with cardiac index of 1.57 by Fick.   Angiographic data  Left ventricle: performed. Left systolic shows normal ejection fraction of 55-60 % . No wall motion of normality.  Right coronary artery: Smooth, proximal segment has 10-20% stenosis, otherwise dominant and gives origin to a very large PL branch. There was slow flow evident in the RCA, which improved slightly with intracoronary nitroglycerin administration.  Left main coronary artery: Normal. No stenosis.  LAD: Large, smooth and has very minimal luminal irregularity. Gives origin to  a moderate sized diagonal 1 which has minimal luminal irregularity.   Circumflex coronary artery: Smooth normal.   Impression: Mild pulmonary hypertension, no significant coronary artery disease and normal LVEF.  Recommendation: Evaluation for noncardiac causes of dyspnea is indicated. A total of 80 mL of contrast was utilized for diagnostic angiography.           Recent Lab Findings: Lab Results  Component Value Date   WBC 11.8* 10/08/2013   HGB 13.4 10/08/2013   HCT 40.0 10/08/2013   PLT 226.0 10/08/2013   GLUCOSE 144* 04/12/2015   CHOL 124 03/05/2013   TRIG 206.0* 03/05/2013   HDL 39.70 03/05/2013   LDLDIRECT 62.8 03/05/2013   LDLCALC 41 02/25/2012   ALT 24 04/12/2015   AST 29 04/12/2015   NA 136 04/12/2015   K 3.5 04/12/2015   CL 98* 04/12/2015   CREATININE 1.45* 04/12/2015   BUN 24*  04/12/2015   CO2 21* 04/12/2015   TSH 0.90 03/05/2013   INR 1.03 04/12/2015   HGBA1C 6.6* 03/05/2013   Chronic Kidney Disease   Stage I     GFR >90  Stage II    GFR 60-89  Stage IIIA GFR 45-59  Stage IIIB GFR 30-44  Stage IV   GFR 15-29  Stage V    GFR  <15  Lab Results  Component Value Date   CREATININE 1.45* 04/12/2015   Estimated Creatinine Clearance: 54 mL/min (by C-G formula based on Cr of 1.45).   Assessment / Plan:   I have seen the patient and agree with proceeding with Bronchoscopy and left VATS with Lung Biopsy for ILS. Risks and options discussed in detail including the expected post op care and length of time in the hospital. Plan to proceed 04/13/2015 Chronic Kidney Disease , CR 1.9 on 10/08/2013,  Now 1.45   Stage IIIA   The goals risks and alternatives of the planned surgical procedure Bronchoscopy and Left VATS with Biopsy  have been discussed with the patient in detail. The risks of the procedure including death, infection, stroke, myocardial infarction, bleeding, blood transfusion have all been discussed specifically.  I have quoted Shawn Cruz a 2% of perioperative mortality and a complication rate as high as 20 %. The patient's questions have been answered.Shawn Cruz is willing  to proceed with the planned procedure.  [name] MD      [address] [place] [address] Office [phone]   Beeper [phone]  04/13/2015 6:58 AM

## 2015-04-13 NOTE — Anesthesia Postprocedure Evaluation (Deleted)
  Anesthesia Post-op Note  Patient: Shawn Cruz  Procedure(s) Performed: Procedure(s): VIDEO BRONCHOSCOPY (N/A) VIDEO ASSISTED THORACOSCOPY (Left) LUNG BIOPSY (Left)  Patient Location: PACU  Anesthesia Type:General  Level of Consciousness: awake and alert   Airway and Oxygen Therapy: Patient Spontanous Breathing and Patient connected to face mask oxygen  Post-op Pain: none  Post-op Assessment: Post-op Vital signs reviewed, Patient's Cardiovascular Status Stable, Respiratory Function Stable and Patent Airway              Post-op Vital Signs: Reviewed and stable  Last Vitals:  Filed Vitals:   04/13/15 0709  BP: 125/63  Pulse: 74  Temp: 36.5 C  Resp: 18    Complications: No apparent anesthesia complications

## 2015-04-13 NOTE — Transfer of Care (Signed)
Immediate Anesthesia Transfer of Care Note  Patient: Shawn Cruz  Procedure(s) Performed: Procedure(s): VIDEO BRONCHOSCOPY (N/A) VIDEO ASSISTED THORACOSCOPY (Left) LUNG BIOPSY (Left)  Patient Location: PACU  Anesthesia Type:General  Level of Consciousness: awake and alert   Airway & Oxygen Therapy: Patient Spontanous Breathing and Patient connected to face mask oxygen  Post-op Assessment: Report given to RN and Post -op Vital signs reviewed and stable  Post vital signs: Reviewed and stable  Last Vitals:  Filed Vitals:   04/13/15 0709  BP: 125/63  Pulse: 74  Temp: 36.5 C  Resp: 18    Complications: No apparent anesthesia complications

## 2015-04-13 NOTE — Brief Op Note (Signed)
04/13/2015  10:28 AM  PATIENT:  Shawn Cruz  74 y.o. male  PRE-OPERATIVE DIAGNOSIS:  ILD  POST-OPERATIVE DIAGNOSIS:  Interstitial lung disease  PROCEDURE:  Procedure(s):  VIDEO BRONCHOSCOPY (N/A)  VIDEO ASSISTED THORACOSCOPY (Left) -Biopsy of Left upper lobe, Left Lower Lobe  SURGEON:  Surgeon(s) and Role:    * [name], MD - Primary  PHYSICIAN ASSISTANT: Ellwood Handler PA-C  ANESTHESIA:   general  EBL:     BLOOD ADMINISTERED:none  DRAINS: 28 [address]   LOCAL MEDICATIONS USED:  NONE  SPECIMEN:  Source of Specimen:  Left Upper Lobe, Left Lower Lobe  DISPOSITION OF SPECIMEN:  PATHOLOGY  COUNTS:  YES  TOURNIQUET:  * No tourniquets in log *  DICTATION: .Dragon Dictation  PLAN OF CARE: Admit to inpatient   PATIENT DISPOSITION:  ICU - extubated and stable.   Delay start of Pharmacological VTE agent (>24hrs) due to surgical blood loss or risk of bleeding: yes

## 2015-04-13 NOTE — Anesthesia Procedure Notes (Signed)
Procedure Name: Intubation Date/Time: 04/13/2015 8:50 AM Performed by: Tamala Fothergill S Patient Re-evaluated:Patient Re-evaluated prior to inductionOxygen Delivery Method: Circle system utilized Preoxygenation: Pre-oxygenation with 100% oxygen Intubation Type: IV induction Ventilation: Mask ventilation without difficulty and Oral airway inserted - appropriate to patient size Laryngoscope Size: Mac and 4 Grade View: Grade I Tube type: Oral Endobronchial tube: Double lumen EBT, EBT position confirmed by auscultation, EBT position confirmed by fiberoptic bronchoscope and Left and 39 Fr Number of attempts: 1 Placement Confirmation: ETT inserted through vocal cords under direct vision,  breath sounds checked- equal and bilateral and positive ETCO2 Tube secured with: Tape Dental Injury: Teeth and Oropharynx as per pre-operative assessment

## 2015-04-13 NOTE — Telephone Encounter (Signed)
First opening is on 7/8. Please advise if you want to be overbooked or pt booked with TP. Thanks.

## 2015-04-13 NOTE — Telephone Encounter (Signed)
Keep with me for 05/13/15. IF results come early I can have him see TP early and give TP instructions

## 2015-04-13 NOTE — Anesthesia Postprocedure Evaluation (Signed)
Anesthesia Post Note  Patient: Shawn Cruz  Procedure(s) Performed: Procedure(s) (LRB): VIDEO BRONCHOSCOPY (N/A) VIDEO ASSISTED THORACOSCOPY (Left) LUNG BIOPSY (Left)  Anesthesia type: General  Patient location: PACU  Post pain: Pain level controlled  Post assessment: Post-op Vital signs reviewed  Last Vitals: BP 113/73 mmHg  Pulse 110  Temp(Src) 36.4 C (Oral)  Resp 23  Ht 5\' 8"  (1.727 m)  Wt 230 lb (104.327 kg)  BMI 34.98 kg/m2  SpO2 93%  Post vital signs: Reviewed  Level of consciousness: sedated  Complications: No apparent anesthesia complications

## 2015-04-13 NOTE — Telephone Encounter (Signed)
He needs fu in 2 weeks to discuss bx results. PRefer me

## 2015-04-13 NOTE — Progress Notes (Signed)
[address]       [place] [address]             [phone]                    Gentry D Thayer  Medical Record #[id number] Date of Birth: [birth year]  Referring:Ramaswamy, Belva Crome, MD [facility]: Delia Chimes, NP Cardiology Dr Einar Gip  Chief Complaint:    Abnormal CT of chest and SOB   History of Present Illness:    Shawn Cruz 74 y.o. male is seen in the office  today for consideration of VATS lung Biopsy. Patient was a long term smoker greater then 50 years 1 1 1/2 ppd. He quite 12 years ago after bout of severe lung infection. Now has increasing SOB with activity. Cardiac cath has been done and ct of chest. Pulmonary has seen for evaluation of ILD and referred for lung bx. 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 Autoimmune panel is essentially negative except for trace positive ANA and cyclic citrulline peptide. However he denies any autoimmune symptoms.  Pulmonary function test  shows an FVC of 2.6 L/65%, FEV1 of 2.16 L/75% and a ratio of 114%. Total lung capacity is 4.7 L/72% and a DLCO of 12.47/43%. Walking desaturation test back inJan/feb 2016 did not desaturate. Denies hemoptysis or wheezing.    Current Activity/ Functional Status:  Patient is independent with mobility/ambulation, transfers, ADL's, IADL's.   Zubrod Score: At the time of surgery this patient's most appropriate activity status/level should be described as: []     0    Normal activity, no symptoms [x]     1    Restricted in physical strenuous activity but ambulatory, able to do out light work []     2    Ambulatory and capable of self care, unable to do work activities, up and about               >50 % of waking hours                              []     3    Only limited self care, in bed greater than 50% of waking hours []     4    Completely disabled, no self care, confined to bed or chair []     5    Moribund   Past Medical History  Diagnosis Date  . DDD  (degenerative disc disease)     LS spine  . Hyperglycemia   . High cholesterol   . Hypertension     history of....not taking anything now  . Shortness of breath dyspnea   . Diabetes mellitus     diet controlled  takes no meds  . Sleep apnea     tested "yrs ago" ...unable to tolerate mask....PCP Toy Cookey is aware  . Anxiety   . GERD (gastroesophageal reflux disease)     takes tums occasionally for indigestion  . Fibromyalgia   . Gout   . Chronic kidney disease     has kidney stones  "galore"  Last attack was 8-10 yrs ago    Past Surgical History  Procedure Laterality Date  . Lumbar discetomy      1977  . Rotator cuff repair      Dr. Durward Fortes  . Back surrgery      2013  . Left heart catheterization with coronary  angiogram N/A 01/11/2015    Procedure: LEFT HEART CATHETERIZATION WITH CORONARY ANGIOGRAM;  Surgeon: Adrian Prows, MD;  Location: [facility] CATH LAB;  Service: Cardiovascular;  Laterality: N/A;  . Back surgery      total of 3 surgeries  . Lithotripsy    . Removal of kidney stone      thru surgery  . Cardiac catheterization      01/2015  . Eye surgery      bilateral cataracts  . Hernia repair      upper ventral hernia (had as child)    Family History  Problem Relation Age of Onset  . Hypertension Mother   . Heart attack Mother     age 81's  . Hypertension Father   . Heart attack Father     age 70's  . Aortic aneurysm Sister     History   Social History  . Marital Status: Married    Spouse Name: N/A  . Number of Children: N/A  . Years of Education: N/A   Occupational History  . retired    Social History Main Topics  . Smoking status: Former Smoker -- 1.00 packs/day for 50 years    Types: Cigarettes    Quit date: 11/05/2005  . Smokeless tobacco: Former Systems developer    Types: Chew     Comment: Quit at age 63  . Alcohol Use: 0.6 oz/week    1 Cans of beer, 0 Standard drinks or equivalent per week     Comment: occass  . Drug Use: No  . Sexual Activity: Not on file    Other Topics Concern  . Not on file   Social History Narrative    History  Smoking status  . Former Smoker -- 1.00 packs/day for 50 years  . Types: Cigarettes  . Quit date: 11/05/2005  Smokeless tobacco  . Former Systems developer  . Types: Chew    Comment: Quit at age 70    History  Alcohol Use  . 0.6 oz/week  . 1 Cans of beer, 0 Standard drinks or equivalent per week    Comment: occass     Allergies  Allergen Reactions  . Sulfonamide Derivatives     REACTION: Age 53, leg swelling    Current Facility-Administered Medications  Medication Dose Route Frequency Provider Last Rate Last Dose  . cefUROXime (ZINACEF) 1.5 g in dextrose 5 % 50 mL IVPB  1.5 g Intravenous To OR [name], MD          Review of Systems:     Cardiac Review of Systems: Y or N  Chest Pain [  n  ]  Resting SOB [ n  ] Exertional SOB  Blue.Reese  ]  Vertell Limber Florencio.Farrier  ]   Pedal Edema [ n  ]    Palpitations [n  ] Syncope  Florencio.Farrier  ]   Presyncope [n   ]  General Review of Systems: [Y] = yes [  ]=no Constitional: recent weight change [n  ];  Wt loss over the last 3 months [   ] anorexia [  ]; fatigue [ y ]; nausea [  ]n; night sweats [n  ]; fever [ n ]; or chills [ n ];          Dental: poor dentition[  ]; Last Dentist visit:   Eye : blurred vision [  ]; diplopia [   ]; vision changes [  ];  Amaurosis fugax[  ]; Resp: cough [ y ];  wheezing[  n];  hemoptysis[n  ]; shortness of breath[y  ]; paroxysmal nocturnal dyspnea[ n ]; dyspnea on exertion[ y ]; or orthopnea[  ];  GI:  gallstones[  ], vomiting[  ];  dysphagia[  ]; melena[  ];  hematochezia [  ]; heartburn[  ];   Hx of  Colonoscopy[  ]; GU: kidney stones [  ]; hematuria[  ];   dysuria [  ];  nocturia[  ];  history of     obstruction [  ]; urinary frequency [  ]             Skin: rash, swelling[  ];, hair loss[  ];  peripheral edema[  ];  or itching[  ]; Musculosketetal: myalgias[  ];  joint swelling[  ];  joint erythema[  ];  joint pain[  ];  back pain[y   ];  Heme/Lymph: bruising[  ];  bleeding[  ];  anemia[  ];  Neuro: TIA[  ];  headaches[  ];  stroke[  ];  vertigo[  ];  seizures[  ];   paresthesias[y  ];  difficulty walking[  ];  Psych:depression[  ]; anxiety[  ];  Endocrine: diabetes[?  ];  thyroid dysfunction[n  ];  Immunizations: Flu up to date [ y ]; Pneumococcal up to date [ y ];  Other:  Physical Exam: There were no vitals taken for this visit.  PHYSICAL EXAMINATION: General appearance: alert, cooperative, appears older than stated age and no distress Head: Normocephalic, without obvious abnormality, atraumatic Neck: no adenopathy, no carotid bruit, no JVD, supple, symmetrical, trachea midline and thyroid not enlarged, symmetric, no tenderness/mass/nodules Lymph nodes: Cervical, supraclavicular, and axillary nodes normal. Resp: mild crackles bilaterial Back: symmetric, no curvature. ROM normal. No CVA tenderness. Cardio: regular rate and rhythm, S1, S2 normal, no murmur, click, rub or gallop GI: soft, non-tender; bowel sounds normal; no masses,  no organomegaly Extremities: extremities normal, atraumatic, no cyanosis or edema and Homans sign is negative, no sign of DVT Neurologic: Grossly normal  Diagnostic Studies & Laboratory data:     Recent Radiology Findings:   CLINICAL DATA: 74 year old male with increasing shortness of breath during exertion since November 2015. Prior history of pneumonia in November treated with antibiotics. Shortness of breath.  EXAM: CT CHEST WITHOUT CONTRAST  TECHNIQUE: Multidetector CT imaging of the chest was performed following the standard protocol without intravenous contrast. High resolution imaging of the lungs, as well as inspiratory and expiratory imaging, was performed.  COMPARISON: No priors.  FINDINGS: Mediastinum/Lymph Nodes: Heart size is normal. There is no significant pericardial fluid, thickening or pericardial calcification. There is atherosclerosis of the thoracic  aorta, the great vessels of the mediastinum and the coronary arteries, including calcified atherosclerotic plaque in the left anterior descending and right coronary arteries. Multiple borderline enlarged mediastinal lymph nodes are nonspecific. No pathologically enlarged mediastinal or hilar lymph nodes. Esophagus is unremarkable in appearance. No axillary lymphadenopathy.  Lungs/Pleura: Patchy areas of very mild ground-glass attenuation are noted in a random distribution in the lungs bilaterally. Some of these areas are associated with subpleural and peribronchovascular reticulation which is rather mild. There is some thickening of the bronchi and mild thickening of the peribronchovascular interstitium as well with some regional areas of mild architectural distortion. Mild cylindrical bronchiectasis is noted throughout the lungs bilaterally. There is 1 region in the periphery of the left upper lobe where there are some thick-walled cysts (image 7 of series 3), however, no other convincing regions of honeycombing are otherwise  noted. No definitive craniocaudal gradient is noted. No acute consolidative airspace disease. No pleural effusions. No suspicious appearing pulmonary nodules or masses. Inspiratory and expiratory imaging demonstrates widespread air trapping, indicative of moderate to severe small airways disease.  Upper Abdomen: Patchy areas of low attenuation in the hepatic parenchyma, compatible with hepatic steatosis. 1.7 cm low-attenuation lesion in the upper pole of the right kidney is incompletely characterized on today's noncontrast CT examination, but is statistically likely a cyst (a cyst was noted in this region on prior lumbar spine MRI 04/10/2008).  Musculoskeletal/Soft Tissues: There are no aggressive appearing lytic or blastic lesions noted in the visualized portions of the skeleton.  IMPRESSION: 1. 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 Strictly speaking, the possibility of very early usual interstitial pneumonia (UIP) is not entirely excluded, particularly given several thick walled cysts in the periphery of the left upper lobe, however, UIP is not favored at this time. Repeat high-resolution chest CT in 12 months would be useful to assess for temporal changes in the appearance of the lung parenchyma if clinically appropriate. 2. Atherosclerosis, including left anterior descending and right coronary artery disease. Assessment for potential risk factor modification, dietary therapy or pharmacologic therapy may be warranted, if clinically indicated. 3. Hepatic steatosis. 4. Additional incidental findings, as above.   Electronically Signed  By: Vinnie Langton M.D.  On: 12/22/2014 11:32 I have independently reviewed the above radiology studies  and reviewed the findings with the patient.  CARDIAC CATH: Left Heart Catheterization   First a 5 French right femoral arterial access was obtained. A 5 French sheath was introduced. A 5 [place] multipurpose B2 catheter was advanced over standard J-wire into the ascending aorta and used to engage Right Coronary Artery. Multiple cineangiographic views of the Right Coronary Artery system(s) were performed. I initially attempted to engage the pain with MP B2, then JL 5 followed by AL-1 and then eventually utilized a JL 4.0 diagnostic catheter to engage the left main coronary artery and angiography was performed. During the process of angiography into the right coronary artery 200 g of nitroglycerin was also administered. Prior to pulling the MP B2 out of the body, LV gram was performed the RAO projection.Left ventriculography was then performed in the RAO projection. Hemodynamics were then resampled and the catheter pulled back across the aortic valve for measurement of  "pullback" gradient. The catheter was then removed the body over wire. All exchanges were made over standard J wire.   Procedural data:  RA pressure 11/11 Mean 10 mm mercury. RA saturation 51 %.  RV pressure 32/2 and Right ventricular EDP 7 mm Hg. PA pressure 37/24 with a mean of 17 mm mercury. After 400 g of intrapulmonary arterial nitroglycerin administration, PA pressure was no significantly different, PA saturation 65 %.  Pulmonary capillary wedge 12/11 with a mean of 10 mm Hg. Aortic saturation 95 %.  Cardiac output was 3.37 with cardiac index of 1.57 by Fick.   Angiographic data  Left ventricle: performed. Left systolic shows normal ejection fraction of 55-60 % . No wall motion of normality.  Right coronary artery: Smooth, proximal segment has 10-20% stenosis, otherwise dominant and gives origin to a very large PL branch. There was slow flow evident in the RCA, which improved slightly with intracoronary nitroglycerin administration.  Left main coronary artery: Normal. No stenosis.  LAD: Large, smooth and has very minimal luminal irregularity. Gives  origin to a moderate sized diagonal 1 which has minimal luminal irregularity.   Circumflex coronary artery: Smooth normal.   Impression: Mild pulmonary hypertension, no significant coronary artery disease and normal LVEF.  Recommendation: Evaluation for noncardiac causes of dyspnea is indicated. A total of 80 mL of contrast was utilized for diagnostic angiography.           Recent Lab Findings: Lab Results  Component Value Date   WBC 11.8* 10/08/2013   HGB 13.4 10/08/2013   HCT 40.0 10/08/2013   PLT 226.0 10/08/2013   GLUCOSE 144* 04/12/2015   CHOL 124 03/05/2013   TRIG 206.0* 03/05/2013   HDL 39.70 03/05/2013   LDLDIRECT 62.8 03/05/2013   LDLCALC 41 02/25/2012   ALT 24 04/12/2015   AST 29 04/12/2015   NA 136 04/12/2015   K 3.5 04/12/2015   CL 98* 04/12/2015   CREATININE 1.45* 04/12/2015   BUN 24*  04/12/2015   CO2 21* 04/12/2015   TSH 0.90 03/05/2013   INR 1.03 04/12/2015   HGBA1C 6.6* 03/05/2013   Chronic Kidney Disease   Stage I     GFR >90  Stage II    GFR 60-89  Stage IIIA GFR 45-59  Stage IIIB GFR 30-44  Stage IV   GFR 15-29  Stage V    GFR  <15  Lab Results  Component Value Date   CREATININE 1.45* 04/12/2015   Estimated Creatinine Clearance: 54 mL/min (by C-G formula based on Cr of 1.45).   Assessment / Plan:   I have seen the patient and agree with proceeding with Bronchoscopy and left VATS with Lung Biopsy for ILS. Risks and options discussed in detail including the expected post op care and length of time in the hospital. Plan to proceed 04/13/2015 Chronic Kidney Disease , CR 1.9 on 10/08/2013,  Now 1.45   Stage IIIA   The goals risks and alternatives of the planned surgical procedure Bronchoscopy and Left VATS with Biopsy  have been discussed with the patient in detail. The risks of the procedure including death, infection, stroke, myocardial infarction, bleeding, blood transfusion have all been discussed specifically.  I have quoted Shawn Cruz a 2% of perioperative mortality and a complication rate as high as 20 %. The patient's questions have been answered.Shawn Cruz is willing  to proceed with the planned procedure.  [name] MD      [address] [place] [address] Office [phone]   Beeper [phone]  04/13/2015 6:48 AM

## 2015-04-13 NOTE — Progress Notes (Signed)
      [address]       [place] [address]             [phone]      S/p Left VATS, lung biopsy  BP 113/66 mmHg  Pulse 109  Temp(Src) 98.4 F (36.9 C) (Oral)  Resp 23  Ht 5\' 8"  (1.727 m)  Wt 230 lb (104.327 kg)  BMI 34.98 kg/m2  SpO2 92%   Intake/Output Summary (Last 24 hours) at 04/13/15 1841 Last data filed at 04/13/15 1800  Gross per 24 hour  Intake   1900 ml  Output    755 ml  Net   1145 ml    Pain reasonably well controlled  Minimal output from [place]. Roxan Hockey, MD [facility] [phone]

## 2015-04-14 ENCOUNTER — Inpatient Hospital Stay (HOSPITAL_COMMUNITY): Payer: Medicare Other

## 2015-04-14 ENCOUNTER — Encounter (HOSPITAL_COMMUNITY): Payer: Self-pay | Admitting: Cardiothoracic Surgery

## 2015-04-14 LAB — GLUCOSE, CAPILLARY
Glucose-Capillary: 101 mg/dL — ABNORMAL HIGH (ref 65–99)
Glucose-Capillary: 111 mg/dL — ABNORMAL HIGH (ref 65–99)
Glucose-Capillary: 117 mg/dL — ABNORMAL HIGH (ref 65–99)
Glucose-Capillary: 122 mg/dL — ABNORMAL HIGH (ref 65–99)
Glucose-Capillary: 126 mg/dL — ABNORMAL HIGH (ref 65–99)
Glucose-Capillary: 145 mg/dL — ABNORMAL HIGH (ref 65–99)
Glucose-Capillary: 91 mg/dL (ref 65–99)

## 2015-04-14 LAB — BLOOD GAS, ARTERIAL
Acid-Base Excess: 1.1 mmol/L (ref 0.0–2.0)
Bicarbonate: 26.6 mEq/L — ABNORMAL HIGH (ref 20.0–24.0)
Drawn by: 36496
FIO2: 0.4 %
O2 Saturation: 96.5 %
PH ART: 7.31 — AB (ref 7.350–7.450)
Patient temperature: 98.6
TCO2: 28.3 mmol/L (ref 0–100)
pCO2 arterial: 54.6 mmHg — ABNORMAL HIGH (ref 35.0–45.0)
pO2, Arterial: 85.2 mmHg (ref 80.0–100.0)

## 2015-04-14 LAB — BASIC METABOLIC PANEL
Anion gap: 10 (ref 5–15)
BUN: 23 mg/dL — AB (ref 6–20)
CO2: 26 mmol/L (ref 22–32)
Calcium: 8.1 mg/dL — ABNORMAL LOW (ref 8.9–10.3)
Chloride: 100 mmol/L — ABNORMAL LOW (ref 101–111)
Creatinine, Ser: 1.62 mg/dL — ABNORMAL HIGH (ref 0.61–1.24)
GFR, EST AFRICAN AMERICAN: 47 mL/min — AB (ref >60)
GFR, EST NON AFRICAN AMERICAN: 41 mL/min — AB (ref >60)
GLUCOSE: 135 mg/dL — AB (ref 65–99)
Potassium: 3.9 mmol/L (ref 3.5–5.1)
Sodium: 136 mmol/L (ref 135–145)

## 2015-04-14 LAB — CBC
HCT: 38.1 % — ABNORMAL LOW (ref 39.0–52.0)
HEMOGLOBIN: 12.6 g/dL — AB (ref 13.0–17.0)
MCH: 30.2 pg (ref 26.0–34.0)
MCHC: 33.1 g/dL (ref 30.0–36.0)
MCV: 91.4 fL (ref 78.0–100.0)
Platelets: 185 10*3/uL (ref 150–400)
RBC: 4.17 MIL/uL — ABNORMAL LOW (ref 4.22–5.81)
RDW: 15.1 % (ref 11.5–15.5)
WBC: 10.1 10*3/uL (ref 4.0–10.5)

## 2015-04-14 MED ORDER — LEVALBUTEROL HCL 0.63 MG/3ML IN NEBU
0.6300 mg | INHALATION_SOLUTION | Freq: Three times a day (TID) | RESPIRATORY_TRACT | Status: DC
  Administered 2015-04-14 – 2015-04-15 (×4): 0.63 mg via RESPIRATORY_TRACT
  Filled 2015-04-14 (×11): qty 3

## 2015-04-14 MED ORDER — IPRATROPIUM BROMIDE 0.02 % IN SOLN
2.5000 mL | Freq: Three times a day (TID) | RESPIRATORY_TRACT | Status: DC
  Administered 2015-04-14 – 2015-04-15 (×4): 0.5 mg via RESPIRATORY_TRACT
  Filled 2015-04-14 (×5): qty 2.5

## 2015-04-14 MED ORDER — ENOXAPARIN SODIUM 30 MG/0.3ML ~~LOC~~ SOLN
30.0000 mg | SUBCUTANEOUS | Status: DC
  Administered 2015-04-14 – 2015-04-16 (×3): 30 mg via SUBCUTANEOUS
  Filled 2015-04-14 (×5): qty 0.3

## 2015-04-14 MED ORDER — TRAMADOL HCL 50 MG PO TABS
50.0000 mg | ORAL_TABLET | Freq: Four times a day (QID) | ORAL | Status: DC | PRN
  Administered 2015-04-15 – 2015-04-17 (×3): 50 mg via ORAL
  Filled 2015-04-14 (×4): qty 1

## 2015-04-14 NOTE — Telephone Encounter (Signed)
lmtcb for pt. Will need to schedule pt appt on 7/8 with MR--ok to use held spot.

## 2015-04-14 NOTE — Progress Notes (Signed)
Patient ID: Shawn Cruz, male   DOB: [birth year], 74 y.o.   MRN: [id number]  SICU Evening Rounds:  Hemodynamically stable   sats 95%  Urine output ok CT output low.  Ambulated 3 times

## 2015-04-14 NOTE — Progress Notes (Signed)
Utilization Review Completed.  

## 2015-04-14 NOTE — Progress Notes (Signed)
Patient ID: Shawn Cruz, male   DOB: 02-Jun-1941, 74 y.o.   MRN: [id number] TCTS DAILY ICU PROGRESS NOTE                   [address]            [place] [address]          [phone]   1 Day Post-Op Procedure(s) (LRB): VIDEO BRONCHOSCOPY (N/A) VIDEO ASSISTED THORACOSCOPY (Left) LUNG BIOPSY (Left)  Total Length of Stay:  LOS: 1 day   Subjective: Stable respiratory status, up to chair, adequate pain contol   Objective: Vital signs in last 24 hours: Temp:  [97.5 F (36.4 C)-98.7 F (37.1 C)] 97.6 F (36.4 C) (06/09 0755) Pulse Rate:  [77-125] 77 (06/09 0700) Cardiac Rhythm:  [-] Heart block (06/09 0400) Resp:  [12-28] 14 (06/09 0700) BP: (86-124)/(42-87) 93/42 mmHg (06/09 0700) SpO2:  [87 %-98 %] 95 % (06/09 0744) Arterial Line BP: (88-155)/(60-92) 117/60 mmHg (06/09 0600)  Filed Weights   04/13/15 0709  Weight: 230 lb (104.327 kg)    Weight change:    Hemodynamic parameters for last 24 hours:    Intake/Output from previous day: 06/08 0701 - 06/09 0700 In: 3630 [P.O.:480; I.V.:3100; IV Piggyback:50] Out: 1330 [Urine:1020; Blood:50; Chest Tube:260]  Intake/Output this shift:    Current Meds: Scheduled Meds: . acetaminophen  1,000 mg Oral 4 times per day   Or  . acetaminophen (TYLENOL) oral liquid 160 mg/5 mL  1,000 mg Oral 4 times per day  . allopurinol  300 mg Oral Daily  . antiseptic oral rinse  7 mL Mouth Rinse BID  . aspirin  81 mg Oral Daily  . atorvastatin  10 mg Oral q1800  . bisacodyl  10 mg Oral Daily  . cholecalciferol  1,000 Units Oral Daily  . citalopram  20 mg Oral Daily  . enoxaparin (LOVENOX) injection  30 mg Subcutaneous Q24H  . fentaNYL   Intravenous 6 times per day  . furosemide  40 mg Oral BID  . insulin aspart  0-24 Units Subcutaneous 6 times per day  . ipratropium  2.5 mL Inhalation TID  . levalbuterol  0.63 mg Nebulization TID  . metoCLOPramide (REGLAN) injection  10 mg Intravenous 4 times per day  .  senna-docusate  1 tablet Oral QHS  . cyanocobalamin  100 mcg Oral Daily   Continuous Infusions: . dextrose 5 % and 0.45% NaCl 100 mL/hr at 04/14/15 0600   PRN Meds:.diphenhydrAMINE **OR** diphenhydrAMINE, naloxone **AND** sodium chloride, ondansetron (ZOFRAN) IV, oxyCODONE, potassium chloride, traMADol  General appearance: alert, cooperative and no distress Neurologic: intact Heart: regular rate and rhythm, S1, S2 normal, no murmur, click, rub or gallop Lungs: diminished breath sounds bibasilar Abdomen: soft, non-tender; bowel sounds normal; no masses,  no organomegaly Extremities: extremities normal, atraumatic, no cyanosis or edema and Homans sign is negative, no sign of DVT Wound: no air leak  Lab Results: CBC: Recent Labs  04/13/15 1032 04/14/15 0435  WBC  --  10.1  HGB 13.9 12.6*  HCT 41.0 38.1*  PLT  --  185   BMET:  Recent Labs  04/12/15 1225 04/13/15 1032 04/14/15 0435  NA 136 139 136  K 3.5 3.7 3.9  CL 98*  --  100*  CO2 21*  --  26  GLUCOSE 144*  --  135*  BUN 24*  --  23*  CREATININE 1.45*  --  1.62*  CALCIUM 9.2  --  8.1*  PT/INR:  Recent Labs  04/12/15 1225  LABPROT 13.7  INR 1.03   Radiology: Dg Chest Port 1 View  04/14/2015   CLINICAL DATA:  Chest tube  EXAM: PORTABLE CHEST - 1 VIEW  COMPARISON:  04/13/2015  FINDINGS: Left chest tube remains in good position.  No pneumothorax  Right jugular catheter tip at the cavoatrial junction unchanged  Low lung volumes with bibasilar atelectasis. Slightly improved aeration in the lung bases compared with the prior study. No significant effusion.  IMPRESSION: No pneumothorax.  Left chest tube unchanged in position  Low lung volumes with slight increase in bibasilar atelectasis.   Electronically Signed   By: Franchot Gallo M.D.   On: 04/14/2015 07:06   Dg Chest Port 1 View  04/13/2015   CLINICAL DATA:  Lung biopsy.  Postsurgical chest radiograph.  EXAM: PORTABLE CHEST - 1 VIEW  COMPARISON:  04/12/2015.  FINDINGS:  Support apparatus: LEFT thoracostomy tube is present which appears to be doubled back on itself near the apex. RIGHT IJ central line is present with the tip in the mid to lower superior vena cava.  Cardiomediastinal Silhouette: Accentuated by low lung volumes on this radiograph. No focal consolidation is identified. Likely unchanged.  Lungs: Atelectasis and pulmonary vascular crowding associated with low volumes. No pneumothorax.  Effusions:  None.  Other:  None.  IMPRESSION: 1. New LEFT thoracostomy tube.  No LEFT pneumothorax. 2. New uncomplicated RIGHT IJ central line. 3. Low volume chest with atelectasis.   Electronically Signed   By: Dereck Ligas M.D.   On: 04/13/2015 11:46     Assessment/Plan: S/P Procedure(s) (LRB): VIDEO BRONCHOSCOPY (N/A) VIDEO ASSISTED THORACOSCOPY (Left) LUNG BIOPSY (Left) Mobilize Diuresis  Lovenox for dvt prophylaxes D/c foley Chest tube to water seal   Shawn Cruz 04/14/2015 8:02 AM

## 2015-04-15 ENCOUNTER — Inpatient Hospital Stay (HOSPITAL_COMMUNITY): Payer: Medicare Other

## 2015-04-15 LAB — GLUCOSE, CAPILLARY
Glucose-Capillary: 113 mg/dL — ABNORMAL HIGH (ref 65–99)
Glucose-Capillary: 89 mg/dL (ref 65–99)
Glucose-Capillary: 95 mg/dL (ref 65–99)
Glucose-Capillary: 113 mg/dL — ABNORMAL HIGH (ref 65–99)

## 2015-04-15 LAB — COMPREHENSIVE METABOLIC PANEL
ALBUMIN: 3 g/dL — AB (ref 3.5–5.0)
ALK PHOS: 61 U/L (ref 38–126)
ALT: 20 U/L (ref 17–63)
ANION GAP: 10 (ref 5–15)
AST: 30 U/L (ref 15–41)
BUN: 21 mg/dL — ABNORMAL HIGH (ref 6–20)
CO2: 29 mmol/L (ref 22–32)
Calcium: 8.2 mg/dL — ABNORMAL LOW (ref 8.9–10.3)
Chloride: 98 mmol/L — ABNORMAL LOW (ref 101–111)
Creatinine, Ser: 1.65 mg/dL — ABNORMAL HIGH (ref 0.61–1.24)
GFR calc non Af Amer: 40 mL/min — ABNORMAL LOW (ref >60)
GFR, EST AFRICAN AMERICAN: 46 mL/min — AB (ref >60)
GLUCOSE: 111 mg/dL — AB (ref 65–99)
POTASSIUM: 3.4 mmol/L — AB (ref 3.5–5.1)
Sodium: 137 mmol/L (ref 135–145)
TOTAL PROTEIN: 6.5 g/dL (ref 6.5–8.1)
Total Bilirubin: 0.6 mg/dL (ref 0.3–1.2)

## 2015-04-15 LAB — CBC
HEMATOCRIT: 36.7 % — AB (ref 39.0–52.0)
Hemoglobin: 12 g/dL — ABNORMAL LOW (ref 13.0–17.0)
MCH: 29.6 pg (ref 26.0–34.0)
MCHC: 32.7 g/dL (ref 30.0–36.0)
MCV: 90.6 fL (ref 78.0–100.0)
Platelets: 175 10*3/uL (ref 150–400)
RBC: 4.05 MIL/uL — ABNORMAL LOW (ref 4.22–5.81)
RDW: 14.9 % (ref 11.5–15.5)
WBC: 9.4 10*3/uL (ref 4.0–10.5)

## 2015-04-15 MED ORDER — SODIUM CHLORIDE 0.9 % IJ SOLN
10.0000 mL | INTRAMUSCULAR | Status: DC | PRN
  Administered 2015-04-15: 10 mL

## 2015-04-15 MED ORDER — GERHARDT'S BUTT CREAM
TOPICAL_CREAM | Freq: Two times a day (BID) | CUTANEOUS | Status: DC | PRN
  Filled 2015-04-15: qty 1

## 2015-04-15 MED ORDER — IPRATROPIUM-ALBUTEROL 0.5-2.5 (3) MG/3ML IN SOLN: 3.0000 mL | Freq: Four times a day (QID) | RESPIRATORY_TRACT | Status: DC | PRN

## 2015-04-15 MED ORDER — INSULIN ASPART 100 UNIT/ML ~~LOC~~ SOLN: 0.0000 [IU] | Freq: Three times a day (TID) | SUBCUTANEOUS | Status: DC

## 2015-04-15 MED ORDER — POLYETHYLENE GLYCOL 3350 17 G PO PACK
17.0000 g | PACK | Freq: Every day | ORAL | Status: DC | PRN
  Administered 2015-04-16: 17 g via ORAL
  Filled 2015-04-15 (×2): qty 1

## 2015-04-15 MED ORDER — POTASSIUM CHLORIDE 10 MEQ/50ML IV SOLN
10.0000 meq | INTRAVENOUS | Status: AC
  Administered 2015-04-15 (×3): 10 meq via INTRAVENOUS
  Filled 2015-04-15 (×3): qty 50

## 2015-04-15 NOTE — Discharge Summary (Signed)
[address]       [place] [address]             [phone]              Discharge Summary  Name: Shawn Cruz DOB: 1940/11/11 74 y.o. MRN: [id number]   Admission Date: 04/13/2015 Discharge Date: 04/17/2015    Admitting Diagnosis: Probable interstitial lung disease   Discharge Diagnosis:  Probable interstitial lung disease  Past Medical History  Diagnosis Date  . DDD (degenerative disc disease)     LS spine  . Hyperglycemia   . High cholesterol   . Hypertension     history of....not taking anything now  . Shortness of breath dyspnea   . Diabetes mellitus     diet controlled  takes no meds  . Sleep apnea     tested "yrs ago" ...unable to tolerate mask....PCP [facility] Cookey is aware  . Anxiety   . GERD (gastroesophageal reflux disease)     takes tums occasionally for indigestion  . Fibromyalgia   . Gout   . Chronic kidney disease     has kidney stones  "galore"  Last attack was 8-10 yrs ago     Procedures: VIDEO BRONCHOSCOPY LEFT VIDEO ASSISTED THORACOSCOPY  LEFT UPPER AND LOWER LOBE LUNG BIOPSIES  - 04/13/2015    HPI:  The patient is a 74 y.o. male with a long history of tobacco use, having smoked 1 1/2 packs per day for greater than 50 years.  He quit 12 years ago after a severe lung infection.  He recently has undergone workup for increasing dyspnea on exertion. Cardiac catheterization was performed which revealed mild pulmonary hypertension and no significant coronary artery disease.  Autoimmune workup has been negative.  CT of the chest did demonstrate probable interstitial lung disease.  The patient was referred to Dr. Servando Snare for consideration of lung biopsy.  Dr. Servando Snare recommended proceeding with left VATS/biopsies for definitive diagnosis. All risks, benefits and alternatives of surgery were explained in detail, and the patient agreed to proceed.     Hospital Course:  The patient was admitted to [facility] on 04/13/2015. The  patient was taken to the operating room and underwent the above procedure.    The postoperative course has been uneventful.  His chest tube was placed to water seal on postop day 1 and chest x-ray showed no pneumothorax. The chest tube was removed on postop day 2 after exam showed no air leak and minimal drainage was observed.  He has not been able to wean from supplemental oxygen, with oxygen desaturations with activity, therefore, home oxygen has been arranged. Incisions are healing well.  He is ambulating in the halls without difficulty and is tolerating a regular diet.  Pain is controlled with oral pain medications.  Follow up chest x-rays have remained stable. Pathology remains pending at this time.  The patient is overall progressing well and is medically stable on today's date for discharge home.    Recent vital signs:  Filed Vitals:   04/17/15 0409  BP: 116/68  Pulse: 84  Temp: 97.9 F (36.6 C)  Resp: 18    Recent laboratory studies:  CBC:  Recent Labs  04/15/15 0435  WBC 9.4  HGB 12.0*  HCT 36.7*  PLT 175   BMET:   Recent Labs  04/15/15 0435  NA 137  K 3.4*  CL 98*  CO2 29  GLUCOSE 111*  BUN 21*  CREATININE 1.65*  CALCIUM 8.2*    PT/INR: No results for input(s): LABPROT, INR in the last 72 hours.   Discharge Medications:     Medication List    STOP taking these medications        HYDROcodone-acetaminophen 10-325 MG per tablet  Commonly known as:  [place] these medications        allopurinol 300 MG tablet  Commonly known as:  ZYLOPRIM  Take 300 mg by mouth daily.     aspirin 81 MG tablet  Take 81 mg by mouth daily.     atorvastatin 10 MG tablet  Commonly known as:  LIPITOR  Take 10 mg by mouth daily at 6 PM.     chlorpheniramine 4 MG tablet  Commonly known as:  CHLOR-TRIMETON  Take 4 mg by mouth 2 (two) times daily as needed for allergies.     citalopram 20 MG tablet  Commonly known as:  CELEXA  TAKE 1 TABLET BY MOUTH DAILY       colchicine 0.6 MG tablet  Commonly known as:  COLCRYS  TAKE 0.6 MG BY MOUTH DAILY AS NEEDED FOR GOUT FLARE UP     cyanocobalamin 100 MCG tablet  Take 100 mcg by mouth daily.     furosemide 40 MG tablet  Commonly known as:  LASIX  TAKE 1 TABLET BY MOUTH TWICE DAILY     ipratropium 17 MCG/ACT inhaler  Commonly known as:  ATROVENT HFA  Inhale 2 puffs into the lungs every 6 (six) hours.     oxyCODONE 5 MG immediate release tablet  Commonly known as:  Oxy IR/ROXICODONE  Take 1-2 tablets (5-10 mg total) by mouth every 4 (four) hours as needed for severe pain.     polyethylene glycol packet  Commonly known as:  MIRALAX / GLYCOLAX  Take 17 g by mouth daily as needed for mild constipation or moderate constipation.     Vitamin D 2000 UNITS Caps  Take 1,000 Units by mouth daily.         Discharge Instructions:  The patient is to refrain from driving, heavy lifting or strenuous activity.  May shower daily and clean incisions with soap and water.  May resume regular diet.   Follow Up: Follow-up Information    Follow up with [name], MD On 05/12/2015.   Specialty:  Cardiothoracic Surgery   Why:  Have a chest x-ray at [place] at 10:00, then see MD at 11:00   Contact information:   [address] [place] [address] [phone]       Follow up with [facility] RN On 04/22/2015.   Why:  For suture removal at 9:30      Follow up with [facility], MD.   Specialty:  Pulmonary Disease   Why:  Follow up as directed   Contact information:   [place] [address] [phone]           Moriah Loughry H 04/17/2015, 8:55 AM

## 2015-04-15 NOTE — Progress Notes (Signed)
41 mL Fentanyl PCA wasted in the sink. Witnessed by Lily Kocher, RN.

## 2015-04-15 NOTE — Progress Notes (Addendum)
[facility] DAILY ICU PROGRESS NOTE                   [address]            [place] [address]          [phone]   2 Days Post-Op Procedure(s) (LRB): VIDEO BRONCHOSCOPY (N/A) VIDEO ASSISTED THORACOSCOPY (Left) LUNG BIOPSY (Left)  Total Length of Stay:  LOS: 2 days   Subjective: Feels well, breathing stable.  Walking in halls without difficulty.     Objective: Vital signs in last 24 hours: Temp:  [97.9 F (36.6 C)-98.8 F (37.1 C)] 98.8 F (37.1 C) (06/10 0357) Pulse Rate:  [79-106] 86 (06/10 0800) Cardiac Rhythm:  [-] Heart block (06/10 0800) Resp:  [11-31] 13 (06/10 0800) BP: (93-132)/(50-77) 124/60 mmHg (06/10 0800) SpO2:  [90 %-98 %] 95 % (06/10 0800)  Filed Weights   04/13/15 0709  Weight: 230 lb (104.327 kg)    Weight change:    Hemodynamic parameters for last 24 hours:    Intake/Output from previous day: 06/09 0701 - 06/10 0700 In: 709 [P.O.:360; I.V.:249; IV Piggyback:100] Out: 2030 [Urine:1955; Chest Tube:75]  Intake/Output this shift: Total I/O In: 60 [I.V.:10; IV Piggyback:50] Out: -   Current Meds: Scheduled Meds: . acetaminophen  1,000 mg Oral 4 times per day   Or  . acetaminophen (TYLENOL) oral liquid 160 mg/5 mL  1,000 mg Oral 4 times per day  . allopurinol  300 mg Oral Daily  . aspirin  81 mg Oral Daily  . atorvastatin  10 mg Oral q1800  . bisacodyl  10 mg Oral Daily  . cholecalciferol  1,000 Units Oral Daily  . citalopram  20 mg Oral Daily  . enoxaparin (LOVENOX) injection  30 mg Subcutaneous Q24H  . fentaNYL   Intravenous 6 times per day  . furosemide  40 mg Oral BID  . insulin aspart  0-24 Units Subcutaneous 6 times per day  . ipratropium  2.5 mL Inhalation TID  . levalbuterol  0.63 mg Nebulization TID  . potassium chloride  10 mEq Intravenous Q1 Hr x 3  . senna-docusate  1 tablet Oral QHS  . cyanocobalamin  100 mcg Oral Daily   Continuous Infusions: . dextrose 5 % and 0.45% NaCl 10 mL/hr (04/14/15 0802)   PRN  Meds:.diphenhydrAMINE **OR** diphenhydrAMINE, naloxone **AND** sodium chloride, ondansetron (ZOFRAN) IV, oxyCODONE, potassium chloride, traMADol  Physical Exam: General appearance: alert, cooperative and no distress Heart: regular rate and rhythm Lungs: Slightly decreased in bases Wound: Clean and dry Chest tube: No air leak  Lab Results: CBC: Recent Labs  04/14/15 0435 04/15/15 0435  WBC 10.1 9.4  HGB 12.6* 12.0*  HCT 38.1* 36.7*  PLT 185 175   BMET:  Recent Labs  04/14/15 0435 04/15/15 0435  NA 136 137  K 3.9 3.4*  CL 100* 98*  CO2 26 29  GLUCOSE 135* 111*  BUN 23* 21*  CREATININE 1.62* 1.65*  CALCIUM 8.1* 8.2*    PT/INR:  Recent Labs  04/12/15 1225  LABPROT 13.7  INR 1.03   Radiology: Dg Chest Port 1 View  04/15/2015   CLINICAL DATA:  Lung biopsy  EXAM: PORTABLE CHEST - 1 VIEW  COMPARISON:  04/14/2015  FINDINGS: Left chest tube is unchanged in position. There is no pneumothorax. The right jugular central line extends into the cavoatrial junction. Diffuse interstitial coarsening and mild linear basilar opacities persist without significant interval change. There is no large effusion  IMPRESSION: Support equipment appears satisfactorily positioned.  No significant interval change in the bilateral airspace opacities.  No pneumothorax   Electronically Signed   By: Andreas Newport M.D.   On: 04/15/2015 06:13     Assessment/Plan: S/P Procedure(s) (LRB): VIDEO BRONCHOSCOPY (N/A) VIDEO ASSISTED THORACOSCOPY (Left) LUNG BIOPSY (Left)  CT with no air leak and minimal output.  CXR stable.  Hopefully can d/c CT today.  Continue pulm toilet/IS, wean O2.  Cr stable. Continue to watch.  Likely tx stepdown today and home this weekend if he continues to progress.  COLLINS,GINA H 04/15/2015 8:30 AM  Plan transfer to 2w D/c chest tube  Path pending I have seen and examined Shawn Cruz and agree with the above assessment  and plan.  [name]  MD Beeper [phone] Office [phone] 04/15/2015 9:48 AM

## 2015-04-15 NOTE — Progress Notes (Signed)
Pt states he takes txs PRN at home only and requests that schedule be made the same here.  BIL BS clear but slightly diminished.  IS encouraged.

## 2015-04-15 NOTE — Telephone Encounter (Signed)
Called and spoke to pt's wife. Appt made for 7/8 with MR. Pt's wife verbalized understanding and denied any further questions or concerns at this time.

## 2015-04-15 NOTE — Progress Notes (Signed)
Pt transferred to 2W16 with belongings. Report given to receiving RN and all questions answered. Receiving RN at bedside. VSS during transfer.  Pt assisted to chair in new room. Family at bedside.

## 2015-04-16 ENCOUNTER — Inpatient Hospital Stay (HOSPITAL_COMMUNITY): Payer: Medicare Other

## 2015-04-16 LAB — GLUCOSE, CAPILLARY
Glucose-Capillary: 105 mg/dL — ABNORMAL HIGH (ref 65–99)
Glucose-Capillary: 90 mg/dL (ref 65–99)
Glucose-Capillary: 103 mg/dL — ABNORMAL HIGH (ref 65–99)
Glucose-Capillary: 138 mg/dL — ABNORMAL HIGH (ref 65–99)

## 2015-04-16 LAB — TISSUE CULTURE: Culture: NO GROWTH

## 2015-04-16 MED ORDER — OXYCODONE HCL 5 MG PO TABS: 5.0000 mg | ORAL_TABLET | ORAL | Status: DC | PRN

## 2015-04-16 MED ORDER — COLCHICINE 0.6 MG PO TABS: ORAL_TABLET | ORAL | Status: DC

## 2015-04-16 NOTE — Progress Notes (Signed)
SATURATION QUALIFICATIONS: (This note is used to comply with regulatory documentation for home oxygen)  Patient Saturations on Room Air at Rest = 87%  Patient Saturations on Room Air while Ambulating = 86%  Patient Saturations on 2L Liters of oxygen while Ambulating = 93%  Please briefly explain why patient needs home oxygen: Patient desats on Ra and while ambulating.   Programme researcher, broadcasting/film/video

## 2015-04-16 NOTE — Progress Notes (Addendum)
       [address]       [place] [address]             [phone]          3 Days Post-Op Procedure(s) (LRB): VIDEO BRONCHOSCOPY (N/A) VIDEO ASSISTED THORACOSCOPY (Left) LUNG BIOPSY (Left)  Subjective: Just back from CXR. Feels well, no complaints.   Objective: Vital signs in last 24 hours: Patient Vitals for the past 24 hrs:  BP Temp Temp src Pulse Resp SpO2  04/16/15 0544 134/69 mmHg 98 F (36.7 C) Oral 95 18 94 %  04/15/15 2308 119/67 mmHg 97.8 F (36.6 C) Oral (!) 114 18 92 %  04/15/15 1224 (!) 119/57 mmHg 98.4 F (36.9 C) Oral 87 18 96 %  04/15/15 1100 (!) 108/53 mmHg - - 86 15 94 %  04/15/15 1000 (!) 111/50 mmHg - - 89 (!) 22 93 %  04/15/15 0900 (!) 125/59 mmHg - - 98 (!) 26 92 %   Current Weight  04/13/15 230 lb (104.327 kg)     Intake/Output from previous day: 06/10 0701 - 06/11 0700 In: 350 [P.O.:240; I.V.:10; IV Piggyback:100] Out: 1525 [Urine:1525]    PHYSICAL EXAM:  Heart: RRR Lungs: Slightly decreased BS in bases Wound: Clean and dry     Lab Results: CBC: Recent Labs  04/14/15 0435 04/15/15 0435  WBC 10.1 9.4  HGB 12.6* 12.0*  HCT 38.1* 36.7*  PLT 185 175   BMET:  Recent Labs  04/14/15 0435 04/15/15 0435  NA 136 137  K 3.9 3.4*  CL 100* 98*  CO2 26 29  GLUCOSE 135* 111*  BUN 23* 21*  CREATININE 1.62* 1.65*  CALCIUM 8.1* 8.2*    PT/INR: No results for input(s): LABPROT, INR in the last 72 hours.    Assessment/Plan: S/P Procedure(s) (LRB): VIDEO BRONCHOSCOPY (N/A) VIDEO ASSISTED THORACOSCOPY (Left) LUNG BIOPSY (Left) Pulm- still requiring 3L, sats stable.  Continue pulm toilet, wean O2 as able. Home later today vs in am- may need home O2. Path pending.   LOS: 3 days    COLLINS,GINA H 04/16/2015  Bowels not moved yet, still on o2 may been to go home on O2 I have seen and examined Shawn Cruz and agree with the above assessment  and plan.  [name] MD Beeper  [phone] Office [phone] 04/16/2015 1:22 PM

## 2015-04-16 NOTE — Op Note (Signed)
NAME:  CANON, GOLA NO.:  [id number]  MEDICAL RECORD NO.:  [id number]  LOCATION:  2W16C                        FACILITY:  [facility]  PHYSICIAN:  Lanelle Bal, MD    DATE OF BIRTH:  August 27, 1941  DATE OF PROCEDURE:  04/13/2015 DATE OF DISCHARGE:  04/17/2015                              OPERATIVE REPORT   PREOPERATIVE DIAGNOSIS:  Probable interstitial lung disease.  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  PROCEDURE PERFORMED:  Bronchoscopy with left video-assisted thoracoscopy and wedge resection of the upper and lower lobes.  SURGEON:  Lanelle Bal, MD  FIRST ASSISTANT:  Providence Crosby, PA.  BRIEF HISTORY:  The patient is a 74 year old male who has had progressive shortness of breath and chest x-ray and CT scan findings consistent with possible interstitial lung disease.  Rheumatologic evaluation has been carried out and the patient has been seen by Dr. Chase Caller who referred the patient for lung biopsy for diagnosis and consideration of various treatment options.  Risks of the procedure were discussed with the patient in detail and he was agreeable and signed informed consent.  CT scan showed the abnormal areas were equally dispersed between the right and left lung.  DESCRIPTION OF PROCEDURE:  The patient was brought to the operating room and underwent general endotracheal anesthesia with a double-lumen endotracheal tube.  Appropriate time-out was performed and through the double-lumen endotracheal tube, a video fiberoptic bronchoscope was passed to the subsegmental level on both the left and right tracheobronchial tree without evidence of endobronchial lesion and good position of the endotracheal tube.  The scope was removed.  The patient was turned in lateral decubitus position with his left side up.  A second time-out was performed and the left chest was prepped with Betadine and draped  in usual sterile manner.  Three port sites were created, first one in the midaxillary line approximately sixth intercostal space.  Additional port sites more anteriorly and one more posteriorly, approximately fourth intercostal space were created. Through these 3 port sites, a 30-degree scope was introduced and the lung examined.  We were able to do a wedge resection of the area of the left upper lobe and left lower lobe and labeled these separately.  The lung was divided with stapler in each case.  There was no evidence of significant pleural disease in the chest on examination.  A single Blake chest tube was left in place.  The lung was reinflated.  Anterior and posterior port sites were closed with interrupted 2-0 Vicryl in the deep layer and a 3-0 subcuticular stitch in skin edges.  Prior to closure, the lung had reinflated nicely, there was no air leak from the chest tubes.  The patient was then extubated in the operating room.  He tolerated the procedure without obvious complication.  Blood loss was minimal.  He was transferred to the recovery room for further postoperative care.  Sponge and needle count was reported as correct at the completion of the procedure.  At the completion of the procedure, the specimens both for culture of the left upper lobe wedge and pathology of the left upper and left lower lobe wedges were submitted  to Pathology.     Lanelle Bal, MD     EG/MEDQ  D:  04/15/2015  T:  04/16/2015  Job:  [id number]

## 2015-04-17 LAB — GLUCOSE, CAPILLARY: Glucose-Capillary: 108 mg/dL — ABNORMAL HIGH (ref 65–99)

## 2015-04-17 NOTE — Care Management Note (Signed)
Case Management Note  Patient Details  Name: DEUNDRE THONG MRN: [id number] Date of Birth: August 23, 1941  Subjective/Objective:                   Abnormal CT of chest and SOB Action/Plan: Discharge planning  Expected Discharge Date:  04/17/15               Expected Discharge Plan:  [place]  In-House Referral:     Discharge planning Services  CM Consult  Post Acute Care Choice:  Home Health Choice offered to:  Patient  DME Arranged:  Oxygen DME Agency:  [place]:  RN [facility] Agency:  [place]  Status of Service:  Completed, signed off  Medicare Important Message Given:    Date Medicare IM Given:    Medicare IM give by:    Date Additional Medicare IM Given:    Additional Medicare Important Message give by:     If discussed at [place] of Stay Meetings, dates discussed:    Additional Comments: CM met with pt and wife in room to offer choice of home health agency.  Pt chooses [facility] to render [facility] and home oxygen.  Address and contact information verified by pt.  CM called [facility] DME rep, Jeneen Rinks to please deliver the oxygen so pt can discharge.  Referral called to [facility] rep, Tiffany for [employer].  NO other CM needs were communicated.   Dellie Catholic, RN 04/17/2015, 10:46 AM

## 2015-04-17 NOTE — Progress Notes (Addendum)
       [address]       [place] [address]             [phone]          4 Days Post-Op Procedure(s) (LRB): VIDEO BRONCHOSCOPY (N/A) VIDEO ASSISTED THORACOSCOPY (Left) LUNG BIOPSY (Left)  Subjective: Feels well, wants to go home.  Still desats with activity.   Objective: Vital signs in last 24 hours: Patient Vitals for the past 24 hrs:  BP Temp Temp src Pulse Resp SpO2  04/17/15 0409 116/68 mmHg 97.9 F (36.6 C) Oral 84 18 93 %  04/16/15 1958 114/70 mmHg 98.2 F (36.8 C) Oral 94 18 96 %  04/16/15 1520 113/66 mmHg 97.4 F (36.3 C) Oral 85 18 95 %   Current Weight  04/13/15 230 lb (104.327 kg)     Intake/Output from previous day:      PHYSICAL EXAM:  Heart: RRR Lungs: Slightly decreased BS in bases Wound: Clean and dry     Lab Results: CBC: Recent Labs  04/15/15 0435  WBC 9.4  HGB 12.0*  HCT 36.7*  PLT 175   BMET:  Recent Labs  04/15/15 0435  NA 137  K 3.4*  CL 98*  CO2 29  GLUCOSE 111*  BUN 21*  CREATININE 1.65*  CALCIUM 8.2*    PT/INR: No results for input(s): LABPROT, INR in the last 72 hours.    Assessment/Plan: S/P Procedure(s) (LRB): VIDEO BRONCHOSCOPY (N/A) VIDEO ASSISTED THORACOSCOPY (Left) LUNG BIOPSY (Left) Overall doing well.  Still desats with exertion and will need home O2. Plan home later today with home O2.   LOS: 4 days    COLLINS,GINA H 04/17/2015  Plan home today Path still pending I have seen and examined Shawn Cruz and agree with the above assessment  and plan.  [name] MD Beeper [phone] Office [phone] 04/17/2015 10:57 AM

## 2015-04-19 DIAGNOSIS — J849 Interstitial pulmonary disease, unspecified: Secondary | ICD-10-CM | POA: Diagnosis not present

## 2015-04-19 DIAGNOSIS — E1165 Type 2 diabetes mellitus with hyperglycemia: Secondary | ICD-10-CM | POA: Diagnosis not present

## 2015-04-19 DIAGNOSIS — F419 Anxiety disorder, unspecified: Secondary | ICD-10-CM | POA: Diagnosis not present

## 2015-04-19 DIAGNOSIS — K219 Gastro-esophageal reflux disease without esophagitis: Secondary | ICD-10-CM | POA: Diagnosis not present

## 2015-04-19 DIAGNOSIS — E039 Hypothyroidism, unspecified: Secondary | ICD-10-CM | POA: Diagnosis not present

## 2015-04-19 DIAGNOSIS — G4730 Sleep apnea, unspecified: Secondary | ICD-10-CM | POA: Diagnosis not present

## 2015-04-19 DIAGNOSIS — I10 Essential (primary) hypertension: Secondary | ICD-10-CM | POA: Diagnosis not present

## 2015-04-21 DIAGNOSIS — J849 Interstitial pulmonary disease, unspecified: Secondary | ICD-10-CM | POA: Diagnosis not present

## 2015-04-21 DIAGNOSIS — G4730 Sleep apnea, unspecified: Secondary | ICD-10-CM | POA: Diagnosis not present

## 2015-04-21 DIAGNOSIS — E1165 Type 2 diabetes mellitus with hyperglycemia: Secondary | ICD-10-CM | POA: Diagnosis not present

## 2015-04-21 DIAGNOSIS — E039 Hypothyroidism, unspecified: Secondary | ICD-10-CM | POA: Diagnosis not present

## 2015-04-21 DIAGNOSIS — I10 Essential (primary) hypertension: Secondary | ICD-10-CM | POA: Diagnosis not present

## 2015-04-21 DIAGNOSIS — K219 Gastro-esophageal reflux disease without esophagitis: Secondary | ICD-10-CM | POA: Diagnosis not present

## 2015-04-21 DIAGNOSIS — F419 Anxiety disorder, unspecified: Secondary | ICD-10-CM | POA: Diagnosis not present

## 2015-04-22 ENCOUNTER — Encounter (HOSPITAL_COMMUNITY): Payer: Self-pay

## 2015-04-22 DIAGNOSIS — J849 Interstitial pulmonary disease, unspecified: Secondary | ICD-10-CM

## 2015-04-26 DIAGNOSIS — I10 Essential (primary) hypertension: Secondary | ICD-10-CM | POA: Diagnosis not present

## 2015-04-26 DIAGNOSIS — J849 Interstitial pulmonary disease, unspecified: Secondary | ICD-10-CM | POA: Diagnosis not present

## 2015-04-26 DIAGNOSIS — K219 Gastro-esophageal reflux disease without esophagitis: Secondary | ICD-10-CM | POA: Diagnosis not present

## 2015-04-26 DIAGNOSIS — G4730 Sleep apnea, unspecified: Secondary | ICD-10-CM | POA: Diagnosis not present

## 2015-04-26 DIAGNOSIS — E1165 Type 2 diabetes mellitus with hyperglycemia: Secondary | ICD-10-CM | POA: Diagnosis not present

## 2015-04-26 DIAGNOSIS — F419 Anxiety disorder, unspecified: Secondary | ICD-10-CM | POA: Diagnosis not present

## 2015-04-26 DIAGNOSIS — E039 Hypothyroidism, unspecified: Secondary | ICD-10-CM | POA: Diagnosis not present

## 2015-04-28 ENCOUNTER — Telehealth: Payer: Self-pay | Admitting: Internal Medicine

## 2015-04-28 NOTE — Telephone Encounter (Signed)
Spoke with pt. He reports he had lung BX done 2 weeks ago over at [facility] by Dr. Servando Snare. He reports he was told to call our office for results. Please advise MR thanks

## 2015-04-29 ENCOUNTER — Encounter: Payer: Self-pay | Admitting: Cardiothoracic Surgery

## 2015-04-29 ENCOUNTER — Ambulatory Visit (INDEPENDENT_AMBULATORY_CARE_PROVIDER_SITE_OTHER): Payer: Self-pay | Admitting: Cardiothoracic Surgery

## 2015-04-29 VITALS — BP 119/74 | HR 82 | Resp 16 | Ht 68.0 in | Wt 228.0 lb

## 2015-04-29 DIAGNOSIS — E1165 Type 2 diabetes mellitus with hyperglycemia: Secondary | ICD-10-CM | POA: Diagnosis not present

## 2015-04-29 DIAGNOSIS — J849 Interstitial pulmonary disease, unspecified: Secondary | ICD-10-CM

## 2015-04-29 DIAGNOSIS — I10 Essential (primary) hypertension: Secondary | ICD-10-CM | POA: Diagnosis not present

## 2015-04-29 DIAGNOSIS — K219 Gastro-esophageal reflux disease without esophagitis: Secondary | ICD-10-CM | POA: Diagnosis not present

## 2015-04-29 DIAGNOSIS — F419 Anxiety disorder, unspecified: Secondary | ICD-10-CM | POA: Diagnosis not present

## 2015-04-29 DIAGNOSIS — E039 Hypothyroidism, unspecified: Secondary | ICD-10-CM | POA: Diagnosis not present

## 2015-04-29 DIAGNOSIS — G4730 Sleep apnea, unspecified: Secondary | ICD-10-CM | POA: Diagnosis not present

## 2015-04-29 NOTE — Progress Notes (Signed)
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 PROCEDURE PERFORMED: Bronchoscopy with left video-assisted thoracoscopy and wedge resection of the upper and lower lobes. SURGEON: Lanelle Bal, MD  History of Present Illness:     Patient returns to the office day after recent thoracoscopic lung biopsy for question of interstitial lung disease. The patient was concerned because one of the chest tube sites was slightly red. He has on home oxygen currently, but overall feels well and has not any more limited in his physical ability then he was preop. He denies any fever or chills.  He was waiting to hear from the pulmonary office on the final pathology     Past Medical History  Diagnosis Date  . DDD (degenerative disc disease)     LS spine  . Hyperglycemia   . High cholesterol   . Hypertension     history of....not taking anything now  . Shortness of breath dyspnea   . Diabetes mellitus     diet controlled  takes no meds  . Sleep apnea     tested "yrs ago" ...unable to tolerate mask....PCP Toy Cookey is aware  . Anxiety   . GERD (gastroesophageal reflux disease)     takes tums occasionally for indigestion  . Fibromyalgia   . Gout   . Chronic kidney disease     has kidney stones  "galore"  Last attack was 8-10 yrs ago     History  Smoking status  . Former Smoker -- 1.00 packs/day for 50 years  . Types: Cigarettes  . Quit date: 11/05/2005  Smokeless tobacco  . Former Systems developer  . Types: Chew    Comment: Quit at age 74    History    Alcohol Use  . 0.6 oz/week  . 1 Cans of beer, 0 Standard drinks or equivalent per week    Comment: occass     Allergies  Allergen Reactions  . Sulfonamide Derivatives     REACTION: Age 74, leg swelling    Current Outpatient Prescriptions  Medication Sig Dispense Refill  . allopurinol (ZYLOPRIM) 300 MG tablet Take 300 mg by mouth daily.    [name] aspirin 81 MG tablet Take 81 mg by mouth daily.      [name] atorvastatin (LIPITOR) 10 MG tablet Take 10 mg by mouth daily at 6 PM.     . chlorpheniramine (CHLOR-TRIMETON) 4 MG tablet Take 4 mg by mouth 2 (two) times daily as needed for allergies.    . Cholecalciferol (VITAMIN D) 2000 UNITS CAPS Take 1,000 Units by mouth daily.     . citalopram (CELEXA) 20 MG tablet TAKE 1 TABLET BY MOUTH DAILY 90 tablet 0  . colchicine (COLCRYS) 0.6 MG tablet TAKE 0.6 MG BY MOUTH DAILY AS NEEDED FOR GOUT FLARE UP 30 tablet 0  . cyanocobalamin 100 MCG tablet Take 100 mcg by mouth daily.    . furosemide (LASIX) 40 MG tablet TAKE 1 TABLET BY MOUTH TWICE DAILY 60 tablet 0  . HYDROcodone-acetaminophen (NORCO) 10-325 MG per tablet Take 1 tablet by  mouth every 6 (six) hours as needed.    [name] ipratropium (ATROVENT HFA) 17 MCG/ACT inhaler Inhale 2 puffs into the lungs every 6 (six) hours.    . polyethylene glycol (MIRALAX / GLYCOLAX) packet Take 17 g by mouth daily as needed for mild constipation or moderate constipation.     [name] oxyCODONE (OXY IR/ROXICODONE) 5 MG immediate release tablet Take 1-2 tablets (5-10 mg total) by mouth every 4 (four) hours as needed for severe pain. (Patient not taking: Reported on 04/29/2015) 30 tablet 0   No current facility-administered medications for this visit.       Physical Exam: BP 119/74 mmHg  Pulse 82  Resp 16  Ht 5\' 8"  (1.727 m)  Wt 228 lb (103.42 kg)  BMI 34.68 kg/m2  SpO2 96%  General appearance: alert and cooperative Neurologic: intact Heart: regular rate and rhythm, S1, S2 normal, no murmur, click, rub or gallop Lungs:  diminished breath sounds bilaterally Abdomen: soft, non-tender; bowel sounds normal; no masses,  no organomegaly Extremities: extremities normal, atraumatic, no cyanosis or edema and Homans sign is negative, no sign of DVT Wound: The middle of his 3 port sites has a small amount of eschar and surrounding erythema the wound was cleaned and appears to be very superficial.   Diagnostic Studies & Laboratory data:     Recent Radiology Findings:   No results found.    Recent Lab Findings: Lab Results  Component Value Date   WBC 9.4 04/15/2015   HGB 12.0* 04/15/2015   HCT 36.7* 04/15/2015   PLT 175 04/15/2015   GLUCOSE 111* 04/15/2015   CHOL 124 03/05/2013   TRIG 206.0* 03/05/2013   HDL 39.70 03/05/2013   LDLDIRECT 62.8 03/05/2013   LDLCALC 41 02/25/2012   ALT 20 04/15/2015   AST 30 04/15/2015   NA 137 04/15/2015   K 3.4* 04/15/2015   CL 98* 04/15/2015   CREATININE 1.65* 04/15/2015   BUN 21* 04/15/2015   CO2 29 04/15/2015   TSH 0.90 03/05/2013   INR 1.03 04/12/2015   HGBA1C 6.6* 03/05/2013      Assessment / Plan:    Patient doing well following thoracoscopic lung biopsy with some superficial irritation along one of the chest tube sites, he was instructed in local wound care. It did not appear to warrant a course of anti-biotics. Patient was given a copy of his pathology report from [facility] He was referred to pulmonary rehabilitation, but will discuss this with Dr. Chase Caller because he was concerned about the co-pays.  I plan to see the patient back July 7 with a follow-up chest x-ray He will call us back immediately if the chest tube site does not improve with local wound care  [name] MD      [address] [place] [address] Office [phone]   Beeper [phone]  04/29/2015 3:15 PM

## 2015-04-29 NOTE — Telephone Encounter (Signed)
thre is no cancer. Give him appt 05/02/15 to go over results. I need full report from [facility] - you can try to get from [facility] or from our local pathlogy dept

## 2015-04-29 NOTE — Telephone Encounter (Signed)
LMTCB x 1 Patient needs OV with MR on 05/02/15 to review results.

## 2015-05-02 ENCOUNTER — Other Ambulatory Visit: Payer: Self-pay

## 2015-05-02 NOTE — Telephone Encounter (Signed)
Pt is unable to come in for visit today. States that he is unable to drive and has not transportation to get here.  Has appt on July 8th and is wanting to keep this appt as scheduled. Please advise MR if this is okay to keep as is. Thanks.  Full pathology reports printed from [facility] - given to MR

## 2015-05-02 NOTE — Telephone Encounter (Signed)
Yes that is fine for 05/13/15. Please let him know that I might get a 2nd opinion on his slide and this itnerval might be useful

## 2015-05-02 NOTE — Telephone Encounter (Signed)
Patient aware of below.  Nothing further needed.

## 2015-05-04 DIAGNOSIS — M47816 Spondylosis without myelopathy or radiculopathy, lumbar region: Secondary | ICD-10-CM | POA: Diagnosis not present

## 2015-05-06 ENCOUNTER — Other Ambulatory Visit: Payer: Self-pay | Admitting: Cardiothoracic Surgery

## 2015-05-06 DIAGNOSIS — J849 Interstitial pulmonary disease, unspecified: Secondary | ICD-10-CM | POA: Diagnosis not present

## 2015-05-06 DIAGNOSIS — R0602 Shortness of breath: Secondary | ICD-10-CM | POA: Diagnosis not present

## 2015-05-06 DIAGNOSIS — G4730 Sleep apnea, unspecified: Secondary | ICD-10-CM | POA: Diagnosis not present

## 2015-05-06 DIAGNOSIS — E1165 Type 2 diabetes mellitus with hyperglycemia: Secondary | ICD-10-CM | POA: Diagnosis not present

## 2015-05-06 DIAGNOSIS — J189 Pneumonia, unspecified organism: Secondary | ICD-10-CM | POA: Diagnosis not present

## 2015-05-06 DIAGNOSIS — R062 Wheezing: Secondary | ICD-10-CM | POA: Diagnosis not present

## 2015-05-06 DIAGNOSIS — E039 Hypothyroidism, unspecified: Secondary | ICD-10-CM | POA: Diagnosis not present

## 2015-05-06 DIAGNOSIS — K219 Gastro-esophageal reflux disease without esophagitis: Secondary | ICD-10-CM | POA: Diagnosis not present

## 2015-05-06 DIAGNOSIS — I10 Essential (primary) hypertension: Secondary | ICD-10-CM | POA: Diagnosis not present

## 2015-05-06 DIAGNOSIS — F419 Anxiety disorder, unspecified: Secondary | ICD-10-CM | POA: Diagnosis not present

## 2015-05-10 LAB — FUNGUS CULTURE W SMEAR: Fungal Smear: NONE SEEN

## 2015-05-12 ENCOUNTER — Encounter: Payer: Self-pay | Admitting: Cardiothoracic Surgery

## 2015-05-12 ENCOUNTER — Ambulatory Visit (INDEPENDENT_AMBULATORY_CARE_PROVIDER_SITE_OTHER): Payer: Medicare Other | Admitting: Cardiothoracic Surgery

## 2015-05-12 ENCOUNTER — Ambulatory Visit
Admission: RE | Admit: 2015-05-12 | Discharge: 2015-05-12 | Disposition: A | Discharge status: Home / Self Care | Payer: Medicare Other | Source: Ambulatory Visit | Attending: Cardiothoracic Surgery | Admitting: Cardiothoracic Surgery

## 2015-05-12 VITALS — BP 115/80 | HR 102 | Resp 18 | Ht 68.0 in | Wt 228.0 lb

## 2015-05-12 DIAGNOSIS — Z09 Encounter for follow-up examination after completed treatment for conditions other than malignant neoplasm: Secondary | ICD-10-CM | POA: Diagnosis not present

## 2015-05-12 DIAGNOSIS — J849 Interstitial pulmonary disease, unspecified: Secondary | ICD-10-CM

## 2015-05-12 NOTE — Progress Notes (Signed)
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 PROCEDURE PERFORMED: Bronchoscopy with left video-assisted thoracoscopy and wedge resection of the upper and lower lobes. SURGEON: Lanelle Bal, MD  History of Present Illness:     Patient returns to the office day after recent thoracoscopic lung biopsy for question of interstitial lung disease. The patient was concerned because one of the chest tube sites was slightly red. He has on home oxygen currently, but overall feels well and has not any more limited in his physical ability then he was preop. He denies any fever or chills. He continues on home o2. He has some mild  dysesthetic area   over left chest   He was waiting to hear from the pulmonary office on the final pathology. I have given him copy of path report      Past Medical History  Diagnosis Date  . DDD (degenerative disc disease)     LS spine  . Hyperglycemia   . High cholesterol   . Hypertension     history of....not taking anything now  . Shortness of breath dyspnea   . Diabetes mellitus     diet controlled  takes no meds  . Sleep apnea     tested "yrs ago" ...unable to tolerate mask....PCP Toy Cookey is aware  . Anxiety   . GERD (gastroesophageal reflux disease)     takes tums occasionally for indigestion  . Fibromyalgia   . Gout   . Chronic kidney disease     has kidney stones  "galore"  Last attack was 8-10 yrs ago     History  Smoking status  . Former Smoker -- 1.00 packs/day for 50 years  . Types: Cigarettes   . Quit date: 11/05/2005  Smokeless tobacco  . Former Systems developer  . Types: Chew    Comment: Quit at age 51    History  Alcohol Use  . 0.6 oz/week  . 1 Cans of beer, 0 Standard drinks or equivalent per week    Comment: occass     Allergies  Allergen Reactions  . Sulfonamide Derivatives     REACTION: Age 15, leg swelling    Current Outpatient Prescriptions  Medication Sig Dispense Refill  . allopurinol (ZYLOPRIM) 300 MG tablet Take 300 mg by mouth daily.    [name] aspirin 81 MG tablet Take 81 mg by mouth daily.      [name] atorvastatin (LIPITOR) 10 MG tablet Take 10 mg by mouth daily at 6 PM.     . chlorpheniramine (CHLOR-TRIMETON) 4 MG tablet Take 4 mg by mouth 2 (two) times daily as needed for allergies.    . Cholecalciferol (VITAMIN D) 2000 UNITS CAPS Take 1,000 Units by mouth daily.     . citalopram (CELEXA) 20 MG tablet TAKE 1 TABLET BY MOUTH DAILY 90 tablet 0  . colchicine (COLCRYS) 0.6 MG tablet TAKE 0.6 MG BY MOUTH DAILY AS NEEDED FOR GOUT FLARE UP 30 tablet 0  . cyanocobalamin 100 MCG tablet Take 100 mcg by mouth daily.    . furosemide (  LASIX) 40 MG tablet TAKE 1 TABLET BY MOUTH TWICE DAILY 60 tablet 0  . HYDROcodone-acetaminophen (NORCO) 10-325 MG per tablet Take 1 tablet by mouth every 6 (six) hours as needed.    [name] ipratropium (ATROVENT HFA) 17 MCG/ACT inhaler Inhale 2 puffs into the lungs every 6 (six) hours.    . polyethylene glycol (MIRALAX / GLYCOLAX) packet Take 17 g by mouth daily as needed for mild constipation or moderate constipation.      No current facility-administered medications for this visit.       Physical Exam: BP 115/80 mmHg  Pulse 102  Resp 18  Ht 5\' 8"  (1.727 m)  Wt 228 lb (103.42 kg)  BMI 34.68 kg/m2  SpO2 96%  General appearance: alert and cooperative Neurologic: intact Heart: regular rate and rhythm, S1, S2 normal, no murmur, click, rub or gallop Lungs: diminished breath sounds bilaterally Abdomen: soft, non-tender; bowel sounds normal; no  masses,  no organomegaly Extremities: extremities normal, atraumatic, no cyanosis or edema and Homans sign is negative, no sign of DVT Wound: port sites are completely healed with out infection  Diagnostic Studies & Laboratory data:     Recent Radiology Findings:   Dg Chest 2 View  05/12/2015   CLINICAL DATA:  History of interstitial lung disease  EXAM: CHEST  2 VIEW  COMPARISON:  Chest x-ray of 04/16/2015 and CT chest of 12/21/2014  FINDINGS: Chronic interstitial lung disease is again noted. No focal infiltrate or effusion is seen. Mediastinal and hilar contours are unchanged. The heart is stable in size. No acute bony abnormality is seen.  IMPRESSION: Stable chronic interstitial lung disease. No definite active process.   Electronically Signed   By: Ivar Drape M.D.   On: 05/12/2015 11:25      Recent Lab Findings: Lab Results  Component Value Date   WBC 9.4 04/15/2015   HGB 12.0* 04/15/2015   HCT 36.7* 04/15/2015   PLT 175 04/15/2015   GLUCOSE 111* 04/15/2015   CHOL 124 03/05/2013   TRIG 206.0* 03/05/2013   HDL 39.70 03/05/2013   LDLDIRECT 62.8 03/05/2013   LDLCALC 41 02/25/2012   ALT 20 04/15/2015   AST 30 04/15/2015   NA 137 04/15/2015   K 3.4* 04/15/2015   CL 98* 04/15/2015   CREATININE 1.65* 04/15/2015   BUN 21* 04/15/2015   CO2 29 04/15/2015   TSH 0.90 03/05/2013   INR 1.03 04/12/2015   HGBA1C 6.6* 03/05/2013      Assessment / Plan:    Patient doing well following thoracoscopic lung biopsy  Patient was given a copy of his pathology report from [facility] He was referred to pulmonary rehabilitation, but he will discuss this with Dr. Chase Caller because he was concerned about the co-pays.  I plan to see him back as needed    [name] MD      [address] [place] [address] Office [phone]   Beeper [phone]  05/12/2015 11:43 AM

## 2015-05-13 ENCOUNTER — Ambulatory Visit (INDEPENDENT_AMBULATORY_CARE_PROVIDER_SITE_OTHER): Payer: Medicare Other | Admitting: Internal Medicine

## 2015-05-13 ENCOUNTER — Telehealth (HOSPITAL_COMMUNITY): Payer: Self-pay

## 2015-05-13 VITALS — BP 116/80 | HR 68 | Ht 68.0 in | Wt 227.0 lb

## 2015-05-13 DIAGNOSIS — J849 Interstitial pulmonary disease, unspecified: Secondary | ICD-10-CM

## 2015-05-13 NOTE — Patient Instructions (Addendum)
ICD-9-CM ICD-10-CM   1. 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

## 2015-05-13 NOTE — Telephone Encounter (Signed)
I have called and left a message with [place] to inquire about participation in Pulmonary Rehab per Dr. Everrett Coombe referral. Will send letter in mail and follow up.

## 2015-05-13 NOTE — Progress Notes (Signed)
Subjective:    Patient ID: Shawn Cruz, male    DOB: December 23, 1940, 74 y.o.   MRN: [id number]  HPI     OV 05/13/2015  Chief Complaint  Patient presents with  . Follow-up    Pt here after lung biopsy 6.8.16. Pt states his breathing is well as long as he has the O2. Pt denies cough and CP/tightness .    Follow-up interstitial lung disease status post surgical lung biopsy  Is here to follow-up after surgical lung biopsy. Since his biopsy he says he is a little bit more short of breath when he takes a shower. He is using oxygen subjectively for dyspnea which he gives some relief. The need for oxygen is new and is post hospital discharge from the biopsy. He is frustrated by his dyspnea. Walking desaturation test 185 feet 3 laps on room air: 3rd lap at end went down from baseline 94% to 89%   Surgical lung biopsy report was interpreted by Dr. Reggie Pile at [facility]. 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 There are some scattered granuloma that raise the possibility of hypersensitivity pneumonitis but this is not dominant He is not interested in a second opinion on the pathology slide because of high cost. He was interested in pulmonary rehabilitation but the co-pay is high. I did discuss this issue with pulmonary rehabiltation specialist in there is no special program to help with co-pay   Review of Systems  Constitutional: Negative for fever and unexpected weight change.  HENT: Negative for congestion, dental problem, ear pain, nosebleeds, postnasal drip, rhinorrhea, sinus pressure, sneezing, sore throat and trouble swallowing.   Eyes: Negative for redness and itching.  Respiratory: Positive for shortness of breath. Negative for cough, chest tightness and wheezing.   Cardiovascular: Negative for palpitations and  leg swelling.  Gastrointestinal: Negative for nausea and vomiting.  Genitourinary: Negative for dysuria.  Musculoskeletal: Negative for joint swelling.  Skin: Negative for rash.  Neurological: Negative for headaches.  Hematological: Does not bruise/bleed easily.  Psychiatric/Behavioral: Negative for dysphoric mood. The patient is not nervous/anxious.        Objective:   Physical Exam  Filed Vitals:   05/13/15 1349  BP: 116/80  Pulse: 68  Height: 5\' 8"  (1.727 m)  Weight: 227 lb (102.967 kg)  SpO2: 98%    Obese Scar on chest - left side lookshealthy Some basal crackles+  Looks deconditioned     Assessment & Plan:     ICD-9-CM ICD-10-CM   1. 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 [place]  Followup  8 weeks  - walk test and Spiromtry in office at followup    > 50% of this > 25 min visit spent in face to face counseling or coordination of care    Dr. 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 In addition I ran the story in general terms with Dr Fransico Him world reknowned IPF specialist at [facility]. He stated that if granulomas not dominant feature then UIP/IPF  is more likely. I communicaed this with patient over phone and dx is IPF and will start Ofev

## 2015-05-16 ENCOUNTER — Telehealth: Payer: Self-pay | Admitting: Internal Medicine

## 2015-05-16 DIAGNOSIS — F419 Anxiety disorder, unspecified: Secondary | ICD-10-CM | POA: Diagnosis not present

## 2015-05-16 DIAGNOSIS — J849 Interstitial pulmonary disease, unspecified: Secondary | ICD-10-CM | POA: Diagnosis not present

## 2015-05-16 DIAGNOSIS — E039 Hypothyroidism, unspecified: Secondary | ICD-10-CM | POA: Diagnosis not present

## 2015-05-16 DIAGNOSIS — K219 Gastro-esophageal reflux disease without esophagitis: Secondary | ICD-10-CM | POA: Diagnosis not present

## 2015-05-16 DIAGNOSIS — G4730 Sleep apnea, unspecified: Secondary | ICD-10-CM | POA: Diagnosis not present

## 2015-05-16 DIAGNOSIS — E1165 Type 2 diabetes mellitus with hyperglycemia: Secondary | ICD-10-CM | POA: Diagnosis not present

## 2015-05-16 DIAGNOSIS — I10 Essential (primary) hypertension: Secondary | ICD-10-CM | POA: Diagnosis not present

## 2015-05-16 NOTE — Telephone Encounter (Signed)
Shawn Cruz from [facility] called.  She said that Shawn Cruz wanted Shawn Cruz to [facility], Shawn Cruz cannot afford this and wants to know if it would be okay for Shawn Cruz to have the therapy done through [facility] instead.    Dr. Halford Chessman please advise in Shawn Cruz absence.

## 2015-05-16 NOTE — Telephone Encounter (Signed)
This is not an emergent issue.  Not sure what Dr. Chase Caller wanted to accomplish with rehab.  Can wait until he returns from vacation.  Will route back to Triage and Dr. Chase Caller.

## 2015-05-16 NOTE — Telephone Encounter (Signed)
To Dr. Chase Caller

## 2015-05-17 DIAGNOSIS — J849 Interstitial pulmonary disease, unspecified: Secondary | ICD-10-CM | POA: Diagnosis not present

## 2015-05-23 DIAGNOSIS — I10 Essential (primary) hypertension: Secondary | ICD-10-CM | POA: Diagnosis not present

## 2015-05-23 DIAGNOSIS — E039 Hypothyroidism, unspecified: Secondary | ICD-10-CM | POA: Diagnosis not present

## 2015-05-23 DIAGNOSIS — F419 Anxiety disorder, unspecified: Secondary | ICD-10-CM | POA: Diagnosis not present

## 2015-05-23 DIAGNOSIS — K219 Gastro-esophageal reflux disease without esophagitis: Secondary | ICD-10-CM | POA: Diagnosis not present

## 2015-05-23 DIAGNOSIS — J849 Interstitial pulmonary disease, unspecified: Secondary | ICD-10-CM | POA: Diagnosis not present

## 2015-05-23 DIAGNOSIS — E1165 Type 2 diabetes mellitus with hyperglycemia: Secondary | ICD-10-CM | POA: Diagnosis not present

## 2015-05-23 DIAGNOSIS — G4730 Sleep apnea, unspecified: Secondary | ICD-10-CM | POA: Diagnosis not present

## 2015-05-26 LAB — AFB CULTURE WITH SMEAR (NOT AT ARMC): Acid Fast Smear: NONE SEEN

## 2015-05-29 NOTE — Telephone Encounter (Signed)
Shawn Cruz  Re-routing to you instead of triage  I wanted him to have pulmonary rehab for diagnosis of ILD. If he cannot afford [facility]m [facility] at [facility], he can certainly start through [facility] at home provided [facility] has bene transparent about its fee strucuture and he can afford it  Also, on 05/30/15 Monday I will be in office.So  Shawn Cruz get hold of him for me. I need to talk to him about his ILD   Thanks  Dr. Brand Males, M.D., [facility].C.P Pulmonary and Critical Care Medicine Staff Physician [place] [facility] Pager: [phone], If no answer or between  15:00h - 7:00h: call [phone]  05/29/2015 10:30 AM

## 2015-05-30 ENCOUNTER — Telehealth (HOSPITAL_COMMUNITY): Payer: Self-pay

## 2015-05-30 DIAGNOSIS — E039 Hypothyroidism, unspecified: Secondary | ICD-10-CM | POA: Diagnosis not present

## 2015-05-30 DIAGNOSIS — J849 Interstitial pulmonary disease, unspecified: Secondary | ICD-10-CM | POA: Diagnosis not present

## 2015-05-30 DIAGNOSIS — K219 Gastro-esophageal reflux disease without esophagitis: Secondary | ICD-10-CM | POA: Diagnosis not present

## 2015-05-30 DIAGNOSIS — G4730 Sleep apnea, unspecified: Secondary | ICD-10-CM | POA: Diagnosis not present

## 2015-05-30 DIAGNOSIS — F419 Anxiety disorder, unspecified: Secondary | ICD-10-CM | POA: Diagnosis not present

## 2015-05-30 DIAGNOSIS — I10 Essential (primary) hypertension: Secondary | ICD-10-CM | POA: Diagnosis not present

## 2015-05-30 DIAGNOSIS — E1165 Type 2 diabetes mellitus with hyperglycemia: Secondary | ICD-10-CM | POA: Diagnosis not present

## 2015-05-30 NOTE — Telephone Encounter (Signed)
To Daneil Dan for follow up MR wants to speak to this patient today

## 2015-05-30 NOTE — Telephone Encounter (Signed)
Called patient regarding entrance to Pulmonary Rehab.  Patient states that they are interested in attending the program.  Tiffany is going to verify insurance coverage and follow up.    

## 2015-05-30 NOTE — Telephone Encounter (Signed)
Called patient to discuss Pulmonary Rehab.  Patient states that he has a $50 copay which he can not afford.  Offered Maintenance program.  Patient can not attend at this time.  Encouraged to follow up in the future if he changes his mind.

## 2015-05-31 NOTE — Telephone Encounter (Signed)
MR spoke with pt. Per MR, pt is to start OFEV. Forms completed and faxed. Will await decision.

## 2015-06-02 ENCOUNTER — Telehealth: Payer: Self-pay | Admitting: *Deleted

## 2015-06-02 NOTE — Telephone Encounter (Signed)
Please see phone note from 7.28.16. Will sign off.

## 2015-06-02 NOTE — Telephone Encounter (Signed)
Called and spoke to pharmacist and was informed they need a verbal order for the [place]. Verbal order given. Will await decision on PA.

## 2015-06-02 NOTE — Telephone Encounter (Signed)
PA request sent for OFEV through cover my meds. Initiated PA.  Key: [id number]  Ofev 150mg ; 1 tab BID  Pt ID: [id number]

## 2015-06-02 NOTE — Telephone Encounter (Signed)
Shawn Cruz from [facility] called regarding OFEV.  The RX did not have dual signature lines.  She may be reached at [phone], option 2, then option 1.  Fax # [phone].

## 2015-06-06 ENCOUNTER — Telehealth: Payer: Self-pay | Admitting: Internal Medicine

## 2015-06-06 DIAGNOSIS — R062 Wheezing: Secondary | ICD-10-CM | POA: Diagnosis not present

## 2015-06-06 DIAGNOSIS — J189 Pneumonia, unspecified organism: Secondary | ICD-10-CM | POA: Diagnosis not present

## 2015-06-06 DIAGNOSIS — R0602 Shortness of breath: Secondary | ICD-10-CM | POA: Diagnosis not present

## 2015-06-06 NOTE — Telephone Encounter (Signed)
Received denial for OFEV through [employer].  Will take forms to [place] at [place] office to see if provider wants to appeal decision.

## 2015-06-06 NOTE — Telephone Encounter (Signed)
Called spoke with spouse. She reports they are waiting to hear about the [place]. I advised her per previous phone note this was denied and the appeal was suppose to be sent to [facility] this AM. Spouse is asking if this can be taken care of ASAP.  Please advise elise. thanks

## 2015-06-07 NOTE — Telephone Encounter (Addendum)
Received appeal information on 8.1.16. Called and spoke to pt. Informed pt we can start the appeal process and will call with an update. Pt verbalized understanding. Please see other phone note from 7.28.16 for OFEV appeal progress. Will sign off.

## 2015-06-08 NOTE — Telephone Encounter (Signed)
Received appeal form. Form to request an appeal has been completed and faxed to appeals department. Will await determination.

## 2015-06-12 ENCOUNTER — Encounter: Payer: Self-pay | Admitting: Internal Medicine

## 2015-06-12 NOTE — Telephone Encounter (Signed)
Why is Ofev denied? He has biopsy proven IPF

## 2015-06-13 NOTE — Telephone Encounter (Signed)
Yes we need to appeal. He has biopsy proven IPF. The biopsy report has to be attached. Please ask Daneil Dan to handle; she is familiar with process

## 2015-06-13 NOTE — Telephone Encounter (Signed)
Will route message to [facility] to follow up on per her request.

## 2015-06-13 NOTE — Telephone Encounter (Signed)
Per Denial Letter, Shawn Cruz was denied because: "Based on the information your dr Cruz [place] about your health condition, we were not able to approve Ofev.  Medication authorization require the following:  1.  Diagnosis of IPF as documented by exclusion of other know causes of ILD, as documented by one of the following: ICD 9 code 516.31, ICD 10 Code J84.112.    MR, Shawn Cruz has initiated the appeal process for Ofev.  We will await appeal determination.

## 2015-06-13 NOTE — Telephone Encounter (Signed)
Butch Penny From [place] called.  Ofev was denied but they can appeal this, write the letter to the ins, and get the patient the drug but she needs the denial later and needs permission from our doctor in order to do this. Butch Penny can be reached at [phone].

## 2015-06-13 NOTE — Telephone Encounter (Signed)
MR would you like to appeal this? Thanks.

## 2015-06-14 ENCOUNTER — Telehealth: Payer: Self-pay | Admitting: Internal Medicine

## 2015-06-14 NOTE — Telephone Encounter (Signed)
Spoke with pt, states that he is confused with where he stands on his Ofev application.  States that he's receiving phone calls saying that he's approved, while also receiving calls saying that they are needing more information to process his application.    Daneil Dan please advise if you have any info on pt's application status for Ofev.  Thanks!

## 2015-06-14 NOTE — Telephone Encounter (Signed)
Called and spoke to [place]. Shawn Cruz stated she will over turn the appeal on behalf of the Dr. Chase Caller. Shawn Cruz stated she would pick up the denial letter on 8.10.16 to help get the medication approved.

## 2015-06-15 NOTE — Telephone Encounter (Signed)
Butch Penny returned call. CB # is [phone] She will not be able to come by and pick this letter up. She needs [place] to fax it to her at [phone]

## 2015-06-15 NOTE — Telephone Encounter (Signed)
Denial letter faxed to Butch Penny. Will await decision.

## 2015-06-15 NOTE — Telephone Encounter (Signed)
ATC pt's wife. Line continued to ring without VM. WCB.   Appeal process has been initiated and will contact them with a decision.

## 2015-06-17 DIAGNOSIS — L821 Other seborrheic keratosis: Secondary | ICD-10-CM | POA: Diagnosis not present

## 2015-06-17 DIAGNOSIS — B078 Other viral warts: Secondary | ICD-10-CM | POA: Diagnosis not present

## 2015-06-17 DIAGNOSIS — L918 Other hypertrophic disorders of the skin: Secondary | ICD-10-CM | POA: Diagnosis not present

## 2015-06-17 DIAGNOSIS — L570 Actinic keratosis: Secondary | ICD-10-CM | POA: Diagnosis not present

## 2015-06-17 DIAGNOSIS — J849 Interstitial pulmonary disease, unspecified: Secondary | ICD-10-CM | POA: Diagnosis not present

## 2015-06-17 NOTE — Telephone Encounter (Signed)
Called and spoke to [place]. Butch Penny states she will look into the case to see if the denial has been overturned. Butch Penny stated she would call back on 8.15.16. Will await call.

## 2015-06-17 NOTE — Telephone Encounter (Signed)
lmtcb for pt.  

## 2015-06-20 NOTE — Telephone Encounter (Signed)
I called spoke with Shawn Cruz and confirmed they did get the denial overturned and pt should have received his medication last Thursday/friday. FYI for elise.

## 2015-06-20 NOTE — Telephone Encounter (Signed)
lmtcb for Shawn Cruz.  

## 2015-06-20 NOTE — Telephone Encounter (Signed)
Patient has received his OFEV and has been on OFEV x 3 days. Patient has not had any side effects from medication  FYI to [facility]

## 2015-06-20 NOTE — Telephone Encounter (Signed)
Shawn Cruz cb stating [facility] overturned denial for ofev and they were able to fill and send med to patient on 06/15/2015, [phone]

## 2015-06-20 NOTE — Telephone Encounter (Signed)
[phone], Butch Penny [facility] returning call

## 2015-06-21 NOTE — Telephone Encounter (Signed)
Pt already received his medication and has been taking it as directed. Nothing further needed at this time.

## 2015-07-13 DIAGNOSIS — N183 Chronic kidney disease, stage 3 (moderate): Secondary | ICD-10-CM | POA: Diagnosis not present

## 2015-07-18 DIAGNOSIS — J849 Interstitial pulmonary disease, unspecified: Secondary | ICD-10-CM | POA: Diagnosis not present

## 2015-07-22 DIAGNOSIS — J84112 Idiopathic pulmonary fibrosis: Secondary | ICD-10-CM | POA: Diagnosis not present

## 2015-07-22 DIAGNOSIS — N183 Chronic kidney disease, stage 3 (moderate): Secondary | ICD-10-CM | POA: Diagnosis not present

## 2015-07-22 DIAGNOSIS — N2581 Secondary hyperparathyroidism of renal origin: Secondary | ICD-10-CM | POA: Diagnosis not present

## 2015-07-22 DIAGNOSIS — I10 Essential (primary) hypertension: Secondary | ICD-10-CM | POA: Diagnosis not present

## 2015-07-27 ENCOUNTER — Encounter: Payer: Self-pay | Admitting: Internal Medicine

## 2015-07-27 ENCOUNTER — Ambulatory Visit (INDEPENDENT_AMBULATORY_CARE_PROVIDER_SITE_OTHER): Payer: Medicare Other | Admitting: Internal Medicine

## 2015-07-27 VITALS — BP 142/92 | HR 67 | Ht 68.0 in | Wt 231.8 lb

## 2015-07-27 DIAGNOSIS — J84112 Idiopathic pulmonary fibrosis: Secondary | ICD-10-CM | POA: Diagnosis not present

## 2015-07-27 DIAGNOSIS — Z5181 Encounter for therapeutic drug level monitoring: Secondary | ICD-10-CM | POA: Insufficient documentation

## 2015-07-27 NOTE — Progress Notes (Signed)
Subjective:    Patient ID: Shawn Cruz, male    DOB: 01/02/1941, 74 y.o.   MRN: [id number]  HPI   OV 07/27/2015  Chief Complaint  Patient presents with  . Follow-up    breathing has been doing well on 2L continuous. has a lot of questions to ask, too many to go over with nurse   Follow-up idiopathic pulmonary fibrosis  Last seen July 2016. After that we started Ofev based on extensive discussion of the 2 therapies. He is now with his wife. 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 He is on Celexa. Denies any suicidal or homicidal thoughts or crying spells. Dyspnea stable. Uses 2 L oxygen. Wife wants him to attend high school reunion but given the new diagnosis he does not want to. They tried to engage me in a conversation about this but he did not want to get into the topic. He again will not attend pulmonary rehabilitation due to cost issues. He knows he needs to lose weight but is not motivated because of his depression. He is tolerating Ofev well    Current outpatient prescriptions:  .  allopurinol (ZYLOPRIM) 300 MG tablet, Take 300 mg by mouth daily., Disp: , Rfl:  .  aspirin 81 MG tablet, Take 81 mg by mouth daily.  , Disp: , Rfl:  .  atorvastatin (LIPITOR) 10 MG tablet, Take 10 mg by mouth daily at 6 PM. , Disp: , Rfl:  .  chlorpheniramine (CHLOR-TRIMETON) 4 MG tablet, Take 4 mg by mouth 2 (two) times daily as needed for allergies., Disp: , Rfl:  .  Cholecalciferol (VITAMIN D) 2000 UNITS CAPS, Take 1,000 Units by mouth daily. , Disp: , Rfl:  .  citalopram (CELEXA) 20 MG tablet, TAKE 1 TABLET BY MOUTH DAILY, Disp: 90 tablet, Rfl: 0 .  colchicine (COLCRYS) 0.6 MG tablet, TAKE 0.6 MG BY MOUTH DAILY AS NEEDED FOR GOUT FLARE UP, Disp: 30 tablet, Rfl: 0 .  cyanocobalamin 100 MCG tablet, Take 100 mcg by mouth daily., Disp: , Rfl:  .  FLUZONE HIGH-DOSE 0.5 ML SUSY, ADM 0.5ML IM UTD, Disp: , Rfl: 0 .  furosemide (LASIX) 40 MG tablet, TAKE 1 TABLET BY MOUTH  TWICE DAILY, Disp: 60 tablet, Rfl: 0 .  HYDROcodone-acetaminophen (NORCO) 10-325 MG per tablet, Take 1 tablet by mouth every 6 (six) hours as needed., Disp: , Rfl:  .  ipratropium (ATROVENT HFA) 17 MCG/ACT inhaler, Inhale 2 puffs into the lungs every 6 (six) hours., Disp: , Rfl:  .  OFEV 150 MG CAPS, Take 150 mg by mouth 2 (two) times daily., Disp: , Rfl:  .  polyethylene glycol (MIRALAX / GLYCOLAX) packet, Take 17 g by mouth daily as needed for mild constipation or moderate constipation. , Disp: , Rfl:    Review of Systems    positive for depression, dyspnea and some cough overall stable Objective:   Physical Exam  Filed Vitals:   07/27/15 1703 07/27/15 1705  BP: 150/90 142/92  Pulse: 67   Height: 5\' 8"  (1.727 m)   Weight: 231 lb 12.8 oz (105.144 kg)   SpO2: 98%     Examined but this was mostly a discussion centric visit. His exam was mostly positive for obesity and crackles and a slight flat affect but otherwise nonfocal     Assessment & Plan:     ICD-9-CM ICD-10-CM   1. IPF (idiopathic pulmonary fibrosis) 516.31 J84.112 Hepatic function panel  2. Encounter for therapeutic  drug monitoring V58.83 Z51.81 Hepatic function panel    Glad you are tolerating OFev well IPF appears stable Referred him to pulmonary fibrosis [facility] after subsequent conversation with his wife Advocated pulmonary rehabilitation but again he claims cost issues and will not attend Advocated weight loss but he is not motivated Continue oxygen as before  Plan LFT 07/27/2015 or this week Repeat LFT in 4 weeks - drop in to do it Repeat LFT in 8 weeks - drop in to do it Repeat LFT in 12 weeks - drop in to do it  Followup  - To see me in 12 weeks or sooner if needed    (> 50% of this 15 min visit spent in face to face counseling or/and coordination of care)   Dr. Brand Males, M.D., [facility].C.P Pulmonary and Critical Care Medicine Staff Physician [place] Pager: [phone], If no answer or between  15:00h - 7:00h: call [phone]  08/07/2015 4:08 PM

## 2015-07-27 NOTE — Patient Instructions (Addendum)
ICD-9-CM ICD-10-CM   1. IPF (idiopathic pulmonary fibrosis) 516.31 J84.112   2. Encounter for therapeutic drug monitoring V58.83 Z51.81     Glad you are tolerating OFev well IPF appears stable  Plan LFT 07/27/2015 or this week Repeat LFT in 4 weeks - drop in to do it Repeat LFT in 8 weeks - drop in to do it Repeat LFT in 12 weeks - drop in to do it  Followup  - To see me in 12 weeks or sooner if needed   - Spirometry and walking desaturation test at follow-up

## 2015-08-08 ENCOUNTER — Other Ambulatory Visit (INDEPENDENT_AMBULATORY_CARE_PROVIDER_SITE_OTHER): Payer: Medicare Other

## 2015-08-08 DIAGNOSIS — J84112 Idiopathic pulmonary fibrosis: Secondary | ICD-10-CM | POA: Diagnosis not present

## 2015-08-08 DIAGNOSIS — Z5181 Encounter for therapeutic drug level monitoring: Secondary | ICD-10-CM | POA: Diagnosis not present

## 2015-08-08 LAB — HEPATIC FUNCTION PANEL
ALK PHOS: 74 U/L (ref 39–117)
ALT: 30 U/L (ref 0–53)
AST: 28 U/L (ref 0–37)
Albumin: 4 g/dL (ref 3.5–5.2)
BILIRUBIN DIRECT: 0.1 mg/dL (ref 0.0–0.3)
Total Bilirubin: 0.3 mg/dL (ref 0.2–1.2)
Total Protein: 7.8 g/dL (ref 6.0–8.3)

## 2015-08-15 DIAGNOSIS — J069 Acute upper respiratory infection, unspecified: Secondary | ICD-10-CM | POA: Diagnosis not present

## 2015-08-15 DIAGNOSIS — I10 Essential (primary) hypertension: Secondary | ICD-10-CM | POA: Diagnosis not present

## 2015-08-15 DIAGNOSIS — F418 Other specified anxiety disorders: Secondary | ICD-10-CM | POA: Diagnosis not present

## 2015-08-15 DIAGNOSIS — I2510 Atherosclerotic heart disease of native coronary artery without angina pectoris: Secondary | ICD-10-CM | POA: Diagnosis not present

## 2015-08-15 NOTE — Progress Notes (Signed)
Quick Note:  Contacted pt, advised per Dr. Chase Caller LFT is normal and will repeat LFT in 1 month. Pt expressed understanding with no concerns. ______

## 2015-08-17 DIAGNOSIS — J849 Interstitial pulmonary disease, unspecified: Secondary | ICD-10-CM | POA: Diagnosis not present

## 2015-09-01 ENCOUNTER — Telehealth: Payer: Self-pay | Admitting: Internal Medicine

## 2015-09-01 NOTE — Telephone Encounter (Signed)
Spoke with pt. He is aware of MR's recommendation. Will call us back if his symptoms continue. Nothing further was needed.

## 2015-09-01 NOTE — Telephone Encounter (Signed)
Spoke with the pt  He has been having diarrhea for the past 2 wks  He thinks OFEV may be causing this  He takes 150 mg BID  He will try immodium until further recs from MR  Please advise thanks

## 2015-09-01 NOTE — Telephone Encounter (Signed)
How bad is that diarrhea:?

## 2015-09-01 NOTE — Telephone Encounter (Signed)
Spoke with the pt  He states he has had watery stools daily for the past 2 wks Occurs at least once per day, but yesterday had this happen 4 x

## 2015-09-01 NOTE — Telephone Encounter (Signed)
As long as no fever, no blood, no abd pain - this is likely due to ofev and he should add immodium and monitor and be in touch

## 2015-09-16 ENCOUNTER — Other Ambulatory Visit: Payer: Self-pay | Admitting: Internal Medicine

## 2015-09-16 DIAGNOSIS — J84112 Idiopathic pulmonary fibrosis: Secondary | ICD-10-CM

## 2015-09-16 DIAGNOSIS — Z006 Encounter for examination for normal comparison and control in clinical research program: Secondary | ICD-10-CM

## 2015-09-17 DIAGNOSIS — J849 Interstitial pulmonary disease, unspecified: Secondary | ICD-10-CM | POA: Diagnosis not present

## 2015-09-23 ENCOUNTER — Encounter (HOSPITAL_COMMUNITY): Payer: Self-pay

## 2015-09-23 ENCOUNTER — Encounter: Payer: Self-pay | Admitting: Internal Medicine

## 2015-09-23 ENCOUNTER — Inpatient Hospital Stay: Admission: RE | Admit: 2015-09-23 | Payer: Self-pay | Source: Ambulatory Visit

## 2015-10-04 DIAGNOSIS — M47816 Spondylosis without myelopathy or radiculopathy, lumbar region: Secondary | ICD-10-CM | POA: Diagnosis not present

## 2015-10-17 DIAGNOSIS — J849 Interstitial pulmonary disease, unspecified: Secondary | ICD-10-CM | POA: Diagnosis not present

## 2015-10-21 ENCOUNTER — Encounter: Payer: Self-pay | Admitting: Internal Medicine

## 2015-10-21 ENCOUNTER — Ambulatory Visit (INDEPENDENT_AMBULATORY_CARE_PROVIDER_SITE_OTHER): Payer: Medicare Other | Admitting: Internal Medicine

## 2015-10-21 ENCOUNTER — Other Ambulatory Visit (INDEPENDENT_AMBULATORY_CARE_PROVIDER_SITE_OTHER): Payer: Medicare Other

## 2015-10-21 VITALS — BP 128/82 | HR 66 | Ht 68.0 in | Wt 229.0 lb

## 2015-10-21 DIAGNOSIS — K5901 Slow transit constipation: Secondary | ICD-10-CM | POA: Diagnosis not present

## 2015-10-21 DIAGNOSIS — J84112 Idiopathic pulmonary fibrosis: Secondary | ICD-10-CM

## 2015-10-21 DIAGNOSIS — Z5181 Encounter for therapeutic drug level monitoring: Secondary | ICD-10-CM | POA: Diagnosis not present

## 2015-10-21 LAB — HEPATIC FUNCTION PANEL
ALT: 28 U/L (ref 0–53)
AST: 24 U/L (ref 0–37)
Albumin: 4.1 g/dL (ref 3.5–5.2)
Alkaline Phosphatase: 73 U/L (ref 39–117)
BILIRUBIN DIRECT: 0.2 mg/dL (ref 0.0–0.3)
TOTAL PROTEIN: 8.1 g/dL (ref 6.0–8.3)
Total Bilirubin: 0.4 mg/dL (ref 0.2–1.2)

## 2015-10-21 NOTE — Patient Instructions (Signed)
ICD-9-CM ICD-10-CM   1. IPF (idiopathic pulmonary fibrosis) 516.31 J84.112   2. Encounter for therapeutic drug monitoring V58.83 Z51.81     Glad you are tolerating OFev well other than unpredictable diarrhea that you are able to manage with immodium IPF appears stable  Plan LFT 10/21/2015  If diarrhea is bothersome please let us know - for now no change in plan Continue ofev  Followup  - To see me in 12 weeks or sooner if needed   - Spirometry and walking desaturation test at follow-up   - LFT at followu

## 2015-10-21 NOTE — Progress Notes (Signed)
Subjective:     Patient ID: Shawn Cruz, male   DOB: 14-Aug-1941, 74 y.o.   MRN: [id number]  HPI    OV 10/21/2015  Chief Complaint  Patient presents with  . Follow-up    Pt c/o continued SOB that increases with exertion and occasoinal cough. Pt denies chest tighness/wheeze. Pt does report some chest discomfort on the lower left side about where biopsy was performed. Pt states that the pain comes and goes.     Follow-up idiopathic pulmonary fibrosis. Last seen in September 2016. His last liver function test was in October 2006 seen. He is on nintedanib. Overall he is tolerating the drug well except he is beginning to have unpredictable diarrhea now. Imodium helps but even then it can still be unpredictable that he has to run to the toilet. So far he is not wetting his pants. Depression is improved. Wife recently fell down and fractured her wrist. She is on splints. He still frustrated by aging and the decline in his health status. He still will not do pulmonary rehabilitation because of cost issues. He plans to play golf in the spring and was wondering about a lighter oxygen system but then wanted to hold off because of potential cost issues. He is due for liver function test today . He decline participation [employer] [facility]  IPF study   Current outpatient prescriptions:  .  allopurinol (ZYLOPRIM) 300 MG tablet, Take 300 mg by mouth daily., Disp: , Rfl:  .  aspirin 81 MG tablet, Take 81 mg by mouth daily.  , Disp: , Rfl:  .  atorvastatin (LIPITOR) 10 MG tablet, Take 10 mg by mouth daily at 6 PM. , Disp: , Rfl:  .  chlorpheniramine (CHLOR-TRIMETON) 4 MG tablet, Take 4 mg by mouth 2 (two) times daily as needed for allergies., Disp: , Rfl:  .  Cholecalciferol (VITAMIN D) 2000 UNITS CAPS, Take 1,000 Units by mouth daily. , Disp: , Rfl:  .  citalopram (CELEXA) 40 MG tablet, Take 1 tablet by mouth daily., Disp: , Rfl: 6 .  colchicine (COLCRYS) 0.6 MG tablet, TAKE 0.6 MG BY MOUTH DAILY AS NEEDED  FOR GOUT FLARE UP, Disp: 30 tablet, Rfl: 0 .  cyanocobalamin 100 MCG tablet, Take 100 mcg by mouth daily., Disp: , Rfl:  .  furosemide (LASIX) 40 MG tablet, TAKE 1 TABLET BY MOUTH TWICE DAILY, Disp: 60 tablet, Rfl: 0 .  HYDROcodone-acetaminophen (NORCO) 10-325 MG per tablet, Take 1 tablet by mouth every 6 (six) hours as needed., Disp: , Rfl:  .  ipratropium (ATROVENT HFA) 17 MCG/ACT inhaler, Inhale 2 puffs into the lungs every 6 (six) hours., Disp: , Rfl:  .  Loperamide HCl (IMODIUM PO), Take by mouth., Disp: , Rfl:  .  OFEV 150 MG CAPS, Take 150 mg by mouth 2 (two) times daily., Disp: , Rfl:  .  polyethylene glycol (MIRALAX / GLYCOLAX) packet, Take 17 g by mouth daily as needed for mild constipation or moderate constipation. Reported on 10/21/2015, Disp: , Rfl:   Allergies  Allergen Reactions  . Sulfonamide Derivatives     REACTION: Age 21, leg swelling   Immunization History  Administered Date(s) Administered  . Influenza Split 07/06/2014  . Influenza Whole 07/26/2011  . Influenza,inj,Quad PF,36+ Mos 08/04/2013  . Influenza-Unspecified 07/22/2015  . Pneumococcal Conjugate-13 08/05/2014  . Pneumococcal Polysaccharide-23 08/22/2012     Review of Systems Per hpi    Objective:   Physical Exam  Constitutional: He is oriented to person, place,  and time. He appears well-developed and well-nourished. No distress.  HENT:  Head: Normocephalic and atraumatic.  Right Ear: External ear normal.  Left Ear: External ear normal.  Mouth/Throat: Oropharynx is clear and moist. No oropharyngeal exudate.  o2 on  Eyes: Conjunctivae and EOM are normal. Pupils are equal, round, and reactive to light. Right eye exhibits no discharge. Left eye exhibits no discharge. No scleral icterus.  Neck: Normal range of motion. Neck supple. No JVD present. No tracheal deviation present. No thyromegaly present.  Cardiovascular: Normal rate, regular rhythm and intact distal pulses.  Exam reveals no gallop and no  friction rub.   No murmur heard. Pulmonary/Chest: Effort normal. No respiratory distress. He has no wheezes. He has rales. He exhibits no tenderness.  Abdominal: Soft. Bowel sounds are normal. He exhibits no distension and no mass. There is no tenderness. There is no rebound and no guarding.  Visceral obeisty +  Musculoskeletal: Normal range of motion. He exhibits no edema or tenderness.  Lymphadenopathy:    He has no cervical adenopathy.  Neurological: He is alert and oriented to person, place, and time. He has normal reflexes. No cranial nerve deficit. Coordination normal.  Skin: Skin is warm and dry. No rash noted. He is not diaphoretic. No erythema. No pallor.  Psychiatric: He has a normal mood and affect. His behavior is normal. Judgment and thought content normal.  Nursing note and vitals reviewed.   Filed Vitals:   10/21/15 1347  BP: 128/82  Pulse: 66  Height: 5\' 8"  (1.727 m)  Weight: 229 lb (103.874 kg)  SpO2: 96%        Assessment:       ICD-9-CM ICD-10-CM   1. IPF (idiopathic pulmonary fibrosis) (HCC) 516.31 J84.112   2. Encounter for therapeutic drug monitoring V58.83 Z51.81         Plan:      Glad you are tolerating OFev well other than unpredictable diarrhea that you are able to manage with immodium IPF appears stable based on history  Plan LFT 10/21/2015  If diarrhea is bothersome please let us know - for now no change in plan Continue ofev  Followup  - To see me in 12 weeks or sooner if needed   - Spirometry and walking desaturation test at follow-up   - LFT at followu  Dr. Brand Males, M.D., [facility].C.P Pulmonary and Critical Care Medicine Staff Physician [place] [facility] Pager: [phone], If no answer or between  15:00h - 7:00h: call [phone]  10/21/2015 2:21 PM

## 2015-12-08 ENCOUNTER — Telehealth: Payer: Self-pay | Admitting: Internal Medicine

## 2015-12-08 NOTE — Telephone Encounter (Signed)
agre with CDY rec

## 2015-12-08 NOTE — Telephone Encounter (Signed)
Patient aware of rec's per MR. Nothing further needed.

## 2015-12-08 NOTE — Telephone Encounter (Addendum)
Patient says that he is having a reaction to OFEV. Patient taking OFEV 150mg  twice daily.  Patient says that the past few days, he has had a really bad upset stomach and metallic taste in his mouth. Patient said that Diarrhea has been an ongoing condition, but he has not vomited, but has had a lot of nausea.    Allergies  Allergen Reactions  . Sulfonamide Derivatives     REACTION: Age 75, leg swelling   Current Outpatient Prescriptions on File Prior to Visit  Medication Sig Dispense Refill  . allopurinol (ZYLOPRIM) 300 MG tablet Take 300 mg by mouth daily.    [name] aspirin 81 MG tablet Take 81 mg by mouth daily.      [name] atorvastatin (LIPITOR) 10 MG tablet Take 10 mg by mouth daily at 6 PM.     . chlorpheniramine (CHLOR-TRIMETON) 4 MG tablet Take 4 mg by mouth 2 (two) times daily as needed for allergies.    . Cholecalciferol (VITAMIN D) 2000 UNITS CAPS Take 1,000 Units by mouth daily.     . citalopram (CELEXA) 40 MG tablet Take 1 tablet by mouth daily.  6  . colchicine (COLCRYS) 0.6 MG tablet TAKE 0.6 MG BY MOUTH DAILY AS NEEDED FOR GOUT FLARE UP 30 tablet 0  . cyanocobalamin 100 MCG tablet Take 100 mcg by mouth daily.    . furosemide (LASIX) 40 MG tablet TAKE 1 TABLET BY MOUTH TWICE DAILY 60 tablet 0  . HYDROcodone-acetaminophen (NORCO) 10-325 MG per tablet Take 1 tablet by mouth every 6 (six) hours as needed.    [name] ipratropium (ATROVENT HFA) 17 MCG/ACT inhaler Inhale 2 puffs into the lungs every 6 (six) hours.    . Loperamide HCl (IMODIUM PO) Take by mouth.    . OFEV 150 MG CAPS Take 150 mg by mouth 2 (two) times daily.    . polyethylene glycol (MIRALAX / GLYCOLAX) packet Take 17 g by mouth daily as needed for mild constipation or moderate constipation. Reported on 10/21/2015     No current facility-administered medications on file prior to visit.    Sent to Dr. Annamaria Boots in Dr. Golden Pop absence.

## 2015-12-08 NOTE — Telephone Encounter (Signed)
Recommend he stop OFEV for 3-4 days, until his stomach settles down.                          Then resume at redced dose- just taking once daily until he sees Dr Chase Caller

## 2015-12-08 NOTE — Telephone Encounter (Signed)
lmtcb x1 for pt. 

## 2015-12-13 ENCOUNTER — Telehealth: Payer: Self-pay | Admitting: Internal Medicine

## 2015-12-13 NOTE — Telephone Encounter (Signed)
Called made pt aware. He needed nothing further

## 2015-12-13 NOTE — Telephone Encounter (Signed)
Call back Friday to reasess. Hold off Ofeve till then

## 2015-12-13 NOTE — Telephone Encounter (Signed)
Called spoke with pt. He wanted to inform MR that he has still not restarted the OFEV. The metallic taste in mouth is better but not gone. Pt wants to know how much longer should he wait? Please advise MR thanks

## 2015-12-16 ENCOUNTER — Telehealth: Payer: Self-pay | Admitting: Internal Medicine

## 2015-12-16 NOTE — Telephone Encounter (Signed)
Called spoke with pt and is aware of recs below. He verbalized understanding and had no questions

## 2015-12-16 NOTE — Telephone Encounter (Signed)
Ok pleas have him restart ofev but at 1 tab per day -he can restart mid next week. And then we can see what happens

## 2015-12-16 NOTE — Telephone Encounter (Signed)
Pt reports the metallic taste is gone [id number] since stopping the OFEV. He was told to call back today to update MR. Please advise thanks

## 2015-12-27 ENCOUNTER — Telehealth: Payer: Self-pay | Admitting: Internal Medicine

## 2015-12-27 NOTE — Telephone Encounter (Signed)
Called spoke with pt. She reports pt has 2 episodes where he had lost feeling in his hand/arm.  She reports it lasted for about 10 min each time. Pt has no energy at all. Did not call anyone when this happened. She has not called pcp or cardiologists regarding this. I advised pt will need an eval as to see what could be causing this. She will call them 1st thing in the AM. Advised pt should seek emergency care if this happens again. Will forward to MR so he is aware.

## 2015-12-27 NOTE — Telephone Encounter (Signed)
First episdoe a week ago and then again 12/27/2015 - Rt ARM. Lost sensation. Could not hold fork. This sounds like TIA - they should go to ER or see a neurologist asap if he wont go to ER. Not related to Ofev. Told him could be early signs of stroke   Dr. Brand Males, M.D., [facility].C.P Pulmonary and Critical Care Medicine Staff Physician [place] [facility] Pager: [phone], If no answer or between  15:00h - 7:00h: call [phone]  12/27/2015 5:51 PM

## 2015-12-28 NOTE — Telephone Encounter (Signed)
Called spoke with spouse. She reports they did not go to the ED. She is waiting for pt to see PCP today and see what she thinks. Nothing further needed

## 2016-01-25 ENCOUNTER — Ambulatory Visit (INDEPENDENT_AMBULATORY_CARE_PROVIDER_SITE_OTHER): Payer: Medicare Other | Admitting: Internal Medicine

## 2016-01-25 ENCOUNTER — Other Ambulatory Visit (INDEPENDENT_AMBULATORY_CARE_PROVIDER_SITE_OTHER): Payer: Medicare Other

## 2016-01-25 ENCOUNTER — Encounter: Payer: Self-pay | Admitting: Internal Medicine

## 2016-01-25 VITALS — BP 118/66 | HR 78 | Ht 68.0 in | Wt 230.4 lb

## 2016-01-25 DIAGNOSIS — J84112 Idiopathic pulmonary fibrosis: Secondary | ICD-10-CM

## 2016-01-25 DIAGNOSIS — Z5181 Encounter for therapeutic drug level monitoring: Secondary | ICD-10-CM | POA: Diagnosis not present

## 2016-01-25 LAB — HEPATIC FUNCTION PANEL
ALT: 14 U/L (ref 0–53)
AST: 18 U/L (ref 0–37)
Albumin: 4 g/dL (ref 3.5–5.2)
Alkaline Phosphatase: 65 U/L (ref 39–117)
Bilirubin, Direct: 0.1 mg/dL (ref 0.0–0.3)
TOTAL PROTEIN: 8 g/dL (ref 6.0–8.3)
Total Bilirubin: 0.3 mg/dL (ref 0.2–1.2)

## 2016-01-25 NOTE — Addendum Note (Signed)
Addended by: Collier Salina on: 01/25/2016 03:12 PM   Modules accepted: Orders

## 2016-01-25 NOTE — Progress Notes (Signed)
Subjective:     Patient ID: Shawn Cruz, male   DOB: 09/29/1941, 75 y.o.   MRN: [id number]  HPI    OV 01/25/2016  Chief Complaint  Patient presents with  . Follow-up    Pt states his SOB has worsened since last OV and c/o intermittent wheezing and chest tightness when SOB - resolves with rest. Pt denies cough.    Follow-up idiopathic pulmonary fibrosis.  Last visit was in December 2016. He is maintained on nintedanib. Since his last visit he called with symptoms of diarrhea and metallic taste at full dose nintedanib. These resolved when we took him off nintedanib. We then reintroduced nintedanib at 150 mg once daily. With this he says that his metallic taste is very mild and he does not have diarrhea. He does not want to go to full dose nintedanib. In terms of his dyspnea he feels he slowly progressive. He does not feel he can do many activities of daily living easily. With the oxygen it's a little bit easier but even with that it is limited with difficult. Walking desaturation test 185 feet 3 laps on room at: At rest he was 93-94 percent after being off oxygen for 20 minutes. - He did not desaturate  His last form and function test was in May 2016 with an FVC of 59% and a DLCO 45%.    has a past medical history of DDD (degenerative disc disease); Hyperglycemia; High cholesterol; Hypertension; Shortness of breath dyspnea; Diabetes mellitus; Sleep apnea; Anxiety; GERD (gastroesophageal reflux disease); Fibromyalgia; Gout; and Chronic kidney disease.   reports that he quit smoking about 10 years ago. His smoking use included Cigarettes. He has a 50 pack-year smoking history. He has quit using smokeless tobacco. His smokeless tobacco use included Chew.  Past Surgical History  Procedure Laterality Date  . Lumbar discetomy      1977  . Rotator cuff repair      Dr. Durward Fortes  . Back surrgery      2013  . Left heart catheterization with coronary angiogram N/A 01/11/2015    Procedure: LEFT  HEART CATHETERIZATION WITH CORONARY ANGIOGRAM;  Surgeon: Adrian Prows, MD;  Location: [facility] CATH LAB;  Service: Cardiovascular;  Laterality: N/A;  . Back surgery      total of 3 surgeries  . Lithotripsy    . Removal of kidney stone      thru surgery  . Cardiac catheterization      01/2015  . Eye surgery      bilateral cataracts  . Hernia repair      upper ventral hernia (had as child)  . Video bronchoscopy N/A 04/13/2015    Procedure: VIDEO BRONCHOSCOPY;  Surgeon: [name], MD;  Location: [facility];  Service: Thoracic;  Laterality: N/A;  . Video assisted thoracoscopy Left 04/13/2015    Procedure: VIDEO ASSISTED THORACOSCOPY;  Surgeon: [name], MD;  Location: [facility];  Service: Thoracic;  Laterality: Left;  . Lung biopsy Left 04/13/2015    Procedure: LUNG BIOPSY;  Surgeon: [name], MD;  Location: [facility];  Service: Thoracic;  Laterality: Left;    Allergies  Allergen Reactions  . Sulfonamide Derivatives     REACTION: Age 36, leg swelling    Immunization History  Administered Date(s) Administered  . Influenza Split 07/06/2014  . Influenza Whole 07/26/2011  . Influenza,inj,Quad PF,36+ Mos 08/04/2013  . Influenza-Unspecified 07/22/2015  . Pneumococcal Conjugate-13 08/05/2014  . Pneumococcal Polysaccharide-23 08/22/2012    Family History  Problem Relation Age of Onset  . Hypertension Mother   . Heart attack Mother     age 49's  . Hypertension Father   . Heart attack Father     age 93's  . Aortic aneurysm Sister      Current outpatient prescriptions:  .  allopurinol (ZYLOPRIM) 300 MG tablet, Take 300 mg by mouth daily., Disp: , Rfl:  .  aspirin 325 MG tablet, Take 325 mg by mouth daily., Disp: , Rfl:  .  atorvastatin (LIPITOR) 10 MG tablet, Take 10 mg by mouth daily at 6 PM. , Disp: , Rfl:  .  chlorpheniramine (CHLOR-TRIMETON) 4 MG tablet, Take 4 mg by mouth 2 (two) times daily as needed for allergies., Disp: , Rfl:  .  Cholecalciferol (VITAMIN D) 2000 UNITS CAPS,  Take 1,000 Units by mouth daily. , Disp: , Rfl:  .  citalopram (CELEXA) 40 MG tablet, Take 1 tablet by mouth daily., Disp: , Rfl: 6 .  colchicine (COLCRYS) 0.6 MG tablet, TAKE 0.6 MG BY MOUTH DAILY AS NEEDED FOR GOUT FLARE UP, Disp: 30 tablet, Rfl: 0 .  cyanocobalamin 100 MCG tablet, Take 100 mcg by mouth daily., Disp: , Rfl:  .  furosemide (LASIX) 40 MG tablet, TAKE 1 TABLET BY MOUTH TWICE DAILY, Disp: 60 tablet, Rfl: 0 .  HYDROcodone-acetaminophen (NORCO) 10-325 MG per tablet, Take 1 tablet by mouth every 6 (six) hours as needed., Disp: , Rfl:  .  Loperamide HCl (IMODIUM PO), Take by mouth., Disp: , Rfl:  .  OFEV 150 MG CAPS, Take 150 mg by mouth daily. , Disp: , Rfl:      Review of Systems     Objective:   Physical Exam  Constitutional: He is oriented to person, place, and time. He appears well-developed and well-nourished. No distress.  Oxygen on  HENT:  Head: Normocephalic and atraumatic.  Right Ear: External ear normal.  Left Ear: External ear normal.  Mouth/Throat: Oropharynx is clear and moist. No oropharyngeal exudate.  Eyes: Conjunctivae and EOM are normal. Pupils are equal, round, and reactive to light. Right eye exhibits no discharge. Left eye exhibits no discharge. No scleral icterus.  Neck: Normal range of motion. Neck supple. No JVD present. No tracheal deviation present. No thyromegaly present.  Cardiovascular: Normal rate, regular rhythm and intact distal pulses.  Exam reveals no gallop and no friction rub.   No murmur heard. Pulmonary/Chest: Effort normal. No respiratory distress. He has no wheezes. He has rales. He exhibits no tenderness.  Abdominal: Soft. Bowel sounds are normal. He exhibits no distension and no mass. There is no tenderness. There is no rebound and no guarding.  Visible obesity present  Musculoskeletal: Normal range of motion. He exhibits no edema or tenderness.  Lymphadenopathy:    He has no cervical adenopathy.  Neurological: He is alert and  oriented to person, place, and time. He has normal reflexes. No cranial nerve deficit. Coordination normal.  Skin: Skin is warm and dry. No rash noted. He is not diaphoretic. No erythema. No pallor.  Psychiatric: He has a normal mood and affect. His behavior is normal. Judgment and thought content normal.  Nursing note and vitals reviewed.   Filed Vitals:   01/25/16 1416  BP: 118/66  Pulse: 78  Height: 5\' 8"  (1.727 m)  Weight: 230 lb 6.4 oz (104.509 kg)  SpO2: 96%        Assessment:       ICD-9-CM ICD-10-CM   1. IPF (idiopathic pulmonary fibrosis) (  [place]) 516.31 J84.112   2. Encounter for therapeutic drug monitoring V58.83 Z51.81        Plan:      Disease might be slowly progressiveBut I am unsure. Could be physical deconditioning  Plan - Do liver function test today - Change nintedanib to 100 mg twice a day - we will request paperwork for this change - Do spirometry at your convenience at our pulmonary function test lab - Refer pulmonary rehabilitation (in the past this is been expensive for him but we will reassess]  Follow-up - 2 months or sooner if needed - do you spirometry sometime between now and follow-up or at the time of follow-up but it should be done at the pulmonary function test lab    Dr. Brand Males, M.D., [facility].C.P Pulmonary and Critical Care Medicine Staff Physician [place] [facility] Pager: [phone], If no answer or between  15:00h - 7:00h: call [phone]  01/25/2016 2:55 PM        He didn't drop

## 2016-01-25 NOTE — Patient Instructions (Addendum)
ICD-9-CM ICD-10-CM   1. IPF (idiopathic pulmonary fibrosis) (HCC) 516.31 J84.112   2. Encounter for therapeutic drug monitoring V58.83 Z51.81     Disease might be slowly progressive  Plan - Do liver function test today - Change nintedanib to 100 mg twice a day - we will request paperwork for this change - Do spirometry at your convenience at our pulmonary function test lab - Refer pulmonary rehabilitation  Follow-up - 2 months or sooner if needed - do you spirometry sometime between now and follow-up or at the time of follow-up but it should be done at the pulmonary function test lab

## 2016-01-26 ENCOUNTER — Telehealth: Payer: Self-pay | Admitting: Internal Medicine

## 2016-01-26 NOTE — Progress Notes (Signed)
Quick Note:  lmtcb for pt. ______ 

## 2016-01-26 NOTE — Telephone Encounter (Signed)
Pt's Ofev strength has been changed at 3.22.17 OV with MR. New Ofev (100mg  BID) form faxed to [facility] at [phone]. Will await PA.

## 2016-01-27 NOTE — Progress Notes (Signed)
Quick Note:  Pt returned call. Informed him of the results per MR. Pt verbalized understanding and denied any further questions or concerns at this time.   ______

## 2016-02-02 NOTE — Telephone Encounter (Signed)
I have still not received a PA, called [facility] at [phone] and spoke to [place] the pharmacist and was advised pt does not need a PA but rather just needs a VO for pt's change in ofev strength. Gave VO to changes pt's Ofev from 150mg  BID to Ofev 100mg  BID with 11 refills. Nevin Bloodgood stated they will contact pt to schedule a shipment date. Nothing further needed at this time.

## 2016-02-20 ENCOUNTER — Telehealth: Payer: Self-pay | Admitting: Internal Medicine

## 2016-02-20 DIAGNOSIS — J84112 Idiopathic pulmonary fibrosis: Secondary | ICD-10-CM

## 2016-02-20 NOTE — Telephone Encounter (Signed)
New order placed.   Nothing further needed at this time.  

## 2016-02-20 NOTE — Telephone Encounter (Signed)
Shawn  pls seee below and Cruz   Thanks  Dr. Brand Males, M.D., [facility].C.P Pulmonary and Critical Care Medicine Staff Physician [place] [facility] Pager: [phone], If no answer or between  15:00h - 7:00h: call [phone]  02/20/2016 1:24 PM     ........[name] Dr. Chase Caller,   Thank you for placing an order for this patient to attend Pulmonary Rehab. Unfortunately it is the wrong order. The order you need is specific to Pulmonary Rehab ([id number]). Can you please place another order?   Thanks,  [employer]enzo

## 2016-04-04 ENCOUNTER — Encounter: Payer: Self-pay | Admitting: Internal Medicine

## 2016-04-04 ENCOUNTER — Ambulatory Visit (INDEPENDENT_AMBULATORY_CARE_PROVIDER_SITE_OTHER): Payer: Medicare Other | Admitting: Internal Medicine

## 2016-04-04 VITALS — BP 122/80 | HR 81 | Temp 98.7°F | Ht 67.5 in | Wt 231.0 lb

## 2016-04-04 DIAGNOSIS — J849 Interstitial pulmonary disease, unspecified: Secondary | ICD-10-CM | POA: Diagnosis not present

## 2016-04-04 DIAGNOSIS — J84112 Idiopathic pulmonary fibrosis: Secondary | ICD-10-CM

## 2016-04-04 LAB — PULMONARY FUNCTION TEST
FEF 25-75 Pre: 1.88 L/sec
FEF2575-%Pred-Pre: 90 %
FEV1-%Pred-Pre: 63 %
FEV1-PRE: 1.78 L
FEV1FVC-%PRED-PRE: 111 %
FEV6-%Pred-Pre: 59 %
FEV6-PRE: 2.18 L
FEV6FVC-%Pred-Pre: 106 %
FVC-%Pred-Pre: 56 %
FVC-Pre: 2.18 L
PRE FEV1/FVC RATIO: 82 %
Pre FEV6/FVC Ratio: 100 %

## 2016-04-04 NOTE — Assessment & Plan Note (Signed)
Worsening Dyspnea/ Declining PFT's Plan: Walk in office to qualify for increase in oxygen Consider change in therapy from Ofev  to [employer]

## 2016-04-04 NOTE — Progress Notes (Signed)
History of Present Illness Shawn Cruz is a 75 y.o. male with IPF followed by Dr. Chase Caller.   5/31/2017Follow Up Appointment: Pt. Presents to the office for follow up. He feels he is more short of breath, especially with exertion. He also states he is fatigued easily.[name] He is taking 200 mg of Ofab daily. He is tolerating the 200 mg dose well. He was not tolerating the 300 mg dose.There has been a decline in PFT's over the last year of about 15%. We will discuss changing medication from Ofab to Esbriate.   Tests: PFT's 04/04/2016:  FVC: 2.18/ 56% Decline over last year which was 2.34/59%    Past medical hx Past Medical History  Diagnosis Date  . DDD (degenerative disc disease)     LS spine  . Hyperglycemia   . High cholesterol   . Hypertension     history of....not taking anything now  . Shortness of breath dyspnea   . Diabetes mellitus     diet controlled  takes no meds  . Sleep apnea     tested "yrs ago" ...unable to tolerate mask....PCP Shawn Cruz is aware  . Anxiety   . GERD (gastroesophageal reflux disease)     takes tums occasionally for indigestion  . Fibromyalgia   . Gout   . Chronic kidney disease     has kidney stones  "galore"  Last attack was 8-10 yrs ago     Past surgical hx, Family hx, Social hx all reviewed.  Current Outpatient Prescriptions on File Prior to Visit  Medication Sig  . allopurinol (ZYLOPRIM) 300 MG tablet Take 300 mg by mouth daily.  [name] aspirin 325 MG tablet Take 325 mg by mouth daily.  [name] atorvastatin (LIPITOR) 10 MG tablet Take 10 mg by mouth daily at 6 PM.   . chlorpheniramine (CHLOR-TRIMETON) 4 MG tablet Take 4 mg by mouth 2 (two) times daily as needed for allergies.  . Cholecalciferol (VITAMIN D) 2000 UNITS CAPS Take 1,000 Units by mouth daily.   . citalopram (CELEXA) 40 MG tablet Take 1 tablet by mouth daily.  . colchicine (COLCRYS) 0.6 MG tablet TAKE 0.6 MG BY MOUTH DAILY AS NEEDED FOR GOUT FLARE UP  . cyanocobalamin 100 MCG tablet  Take 100 mcg by mouth daily.  . furosemide (LASIX) 40 MG tablet TAKE 1 TABLET BY MOUTH TWICE DAILY  . HYDROcodone-acetaminophen (NORCO) 10-325 MG per tablet Take 1 tablet by mouth every 6 (six) hours as needed.  . Loperamide HCl (IMODIUM PO) Take by mouth.  . OFEV 150 MG CAPS Take 150 mg by mouth daily.    No current facility-administered medications on file prior to visit.     Allergies  Allergen Reactions  . Sulfonamide Derivatives     REACTION: Age 76, leg swelling    Review Of Systems:  Constitutional:   No  weight loss, night sweats,  Fevers, chills, fatigue, or  lassitude.  HEENT:   No headaches,  Difficulty swallowing,  Tooth/dental problems, or  Sore throat,                No sneezing, itching, ear ache, nasal congestion, post nasal drip,   CV:  No chest pain,  Orthopnea, PND, swelling in lower extremities, anasarca, dizziness, palpitations, syncope.   GI  No heartburn, indigestion, abdominal pain, nausea, vomiting, diarrhea, change in bowel habits, loss of appetite, bloody stools.   Resp: +shortness of breath with exertion or at rest.  No excess mucus, no productive cough,  No non-productive cough,  No coughing up of blood.  No change in color of mucus.  No wheezing.  No chest wall deformity  Skin: no rash or lesions.  GU: no dysuria, change in color of urine, no urgency or frequency.  No flank pain, no hematuria   MS:  No joint pain or swelling.  No decreased range of motion.  No back pain.  Psych:  No change in mood or affect. No depression or anxiety.  No memory loss.   Vital Signs BP 122/80 mmHg  Pulse 81  Temp(Src) 98.7 F (37.1 C) (Oral)  Ht 5' 7.5" (1.715 m)  Wt 231 lb (104.781 kg)  BMI 35.62 kg/m2  SpO2 96%   Physical Exam:  General- No distress,  A&Ox3, pleasant ENT: No sinus tenderness, TM clear, pale nasal mucosa, no oral exudate,no post nasal drip, no LAN Cardiac: S1, S2, regular rate and rhythm, no murmur Chest: No wheeze/ ++rales/ crackles /  no dullness; no accessory muscle use, no nasal flaring, no sternal retractions Abd.: Soft Non-tender Ext: No clubbing cyanosis, edema Neuro:  normal strength Skin: No rashes, warm and dry Psych: normal mood and behavior   Assessment/Plan  IPF (idiopathic pulmonary fibrosis) Worsening Dyspnea/ Declining PFT's Plan: Walk in office to qualify for increase in oxygen Consider change in therapy from Ofev  to [place], NP 04/04/2016  2:24 PM   STAFF NOTE: I, Dr Shawn Cruz have personally reviewed patient's available data, including medical history, events of note, physical examination and test results as part of my evaluation. I have discussed with resident/NP and other care providers such as pharmacist, RN and RRT.  In addition,  I personally evaluated patient and elicited key findings of   S: worsening dyspnea and fatiguye. On lower dose of ofev;l tolerating it well. On ofev Rx since approx aug 2016  O: Obese Crackles o2  PFT - 15% decline in FVC since feb 207 Walking desat test on 2L [place] -> now needing 3L [place] to keeo pulse ox up  A: IPF  -progressive disease despite ofev (lower dose) + intolerance to ofev on higher dose  P: change to esbriet - explained drug in detail  > 50% of this > 25 min visit spent in face to face counseling or coordination of care     .  Rest per NP/medical resident whose note is outlined above and that I agree with   Dr. Brand Cruz, M.D., [facility].C.P Pulmonary and Critical Care Medicine Staff Physician [place] [facility] Pager: [phone], If no answer or between  15:00h - 7:00h: call [phone]  04/04/2016 3:06 PM

## 2016-04-04 NOTE — Progress Notes (Signed)
Spirometry done today. 

## 2016-04-04 NOTE — Addendum Note (Signed)
Addended by: Wynn Banker H on: 04/04/2016 03:25 PM   Modules accepted: Orders

## 2016-04-04 NOTE — Patient Instructions (Addendum)
We will walk you in the office today to see if you qualify for an increase in your oxygen. Your Pulmonary Function Tests show some decline in your Pulmonary Function. We will increase your oxygen prescription to 3L nasal [place], as you desaturated on the 2L. We will consider changing your therapy from Ofev to to [place] for better management. Follow up with Dr. Chase Caller in 6-8 weeks   REgarding esbriet  - take product sheet  -- ESBRIET  - 3 pill three times daily, slow titration upwards - Need to wean sunscreen  - Some chance of nausea and anorexia with small chance for diarrhea  - no known heart attack risk - no known bleeding risk,   - need monthly blood work for 6 months - monitor liver function - possible mortality benefit in pooled analysis  - larger world wide experience

## 2016-04-09 ENCOUNTER — Telehealth: Payer: Self-pay | Admitting: Internal Medicine

## 2016-04-09 DIAGNOSIS — J84112 Idiopathic pulmonary fibrosis: Secondary | ICD-10-CM

## 2016-04-09 NOTE — Telephone Encounter (Signed)
Yes that is fine

## 2016-04-09 NOTE — Telephone Encounter (Signed)
[place] for [employer]

## 2016-04-09 NOTE — Telephone Encounter (Signed)
Spoke with pt, wants to know if we can prescribe a POC for him.  Pt uses [facility], last seen 04/04/16.  MR please advise.  Thanks!

## 2016-04-09 NOTE — Telephone Encounter (Signed)
lmtcb X1 for pt to make aware.   Order placed for POC.

## 2016-04-09 NOTE — Addendum Note (Signed)
Addended by: Len Blalock on: 04/09/2016 05:35 PM   Modules accepted: Orders

## 2016-04-10 ENCOUNTER — Telehealth: Payer: Self-pay | Admitting: Internal Medicine

## 2016-04-24 ENCOUNTER — Telehealth: Payer: Self-pay | Admitting: Internal Medicine

## 2016-04-24 NOTE — Telephone Encounter (Signed)
Could not get through to pharmacy at time of call; will need to contact them in the morning-Ashley ck'd inbox for MR and no PA there. Will need to have them re-fax to our office.

## 2016-04-25 NOTE — Telephone Encounter (Signed)
[name] and spoke with [name]. She is going to refax this PA. Will await fax.

## 2016-04-27 ENCOUNTER — Telehealth: Payer: Self-pay | Admitting: Internal Medicine

## 2016-04-27 NOTE — Telephone Encounter (Signed)
Rori called back did we get this fax  [phone]

## 2016-04-27 NOTE — Telephone Encounter (Signed)
Esbriet forms located, DOB has been corrected and application is being re-faxed to [phone] Spoke with Shaun- aware that we are refaxing this.  Alverda Skeans is going to fax PA now to be completed. This has been received and filled out. Given to MR to sign.  See telephone note 04/24/16 for follow up. Will close this encounter.

## 2016-04-27 NOTE — Telephone Encounter (Signed)
Caryl Pina, have you received this fax? Please advise.

## 2016-04-27 NOTE — Telephone Encounter (Signed)
PA received, filled out and placed in MR sign folder.  Please complete ALL highlighted sections and return to triage. Will be faxed back [phone] once completed.  Please advise Dr Chase Caller. Thanks.

## 2016-04-30 NOTE — Telephone Encounter (Signed)
Pt wife calling stating that they met with a drug rep today and the are switching him from ofev to esbriet and they are wanting to know the difference between the two drugs please advise she can be reached @ [phone].Hillery Hunter

## 2016-05-01 NOTE — Telephone Encounter (Signed)
Called spoke with pt. He states that he is already set with esbriet. He states that he wants MR to be aware that he will start the esbriet on 05/03/16. I explained to him that I would send a message to MR. He voiced understanding and had no further questions.   Will send to MR as [facility]

## 2016-05-01 NOTE — Telephone Encounter (Signed)
Ok thanks. Anything else I need to do? If so, send back. If not close out  thjanks  Dr. Brand Males, M.D., [facility].C.P Pulmonary and Critical Care Medicine Staff Physician [place] [facility] and Critical Care Pager: [phone], If no answer or between  15:00h - 7:00h: call [phone]  05/01/2016 2:30 PM

## 2016-05-01 NOTE — Telephone Encounter (Signed)
Pt returning call - can be reached on [phone]-[phone]

## 2016-05-01 NOTE — Telephone Encounter (Signed)
LVM for pt to return call

## 2016-05-09 NOTE — Telephone Encounter (Signed)
Confused. Once I was told he is all set but now I am being told PA denied. SEnding to [facility] to sort out

## 2016-05-09 NOTE — Telephone Encounter (Signed)
PA for esbriet was denied. Denial letter placed in MR look at. Please advise MR thanks

## 2016-05-10 NOTE — Telephone Encounter (Signed)
Attempted to call [employer] at [phone] to follow up on Esbriet denial, but they close at 5:00.  Will hold message to follow up on.

## 2016-05-11 NOTE — Telephone Encounter (Signed)
Spoke with Daisy at [employer], states that this is in fact denied by insurance, and an appeal through insurance is needed. We do need to call [employer] with approval dates once appeal has been done. Pt's Esbriet CASE #: [id number]  Will call Insurance to see about initiating appeal.

## 2016-05-14 NOTE — Telephone Encounter (Signed)
Called appeals number [phone] to initiate appeal for Esbriet PA (PA # is [id number]), form faxed to appeals department at [phone].  Reference number [id number].  Will await appeal decision.

## 2016-05-15 ENCOUNTER — Telehealth: Payer: Self-pay | Admitting: Internal Medicine

## 2016-05-15 NOTE — Telephone Encounter (Signed)
Lynette with [employer] calling regarding PA.  She was given to [place] to speak to in triage.

## 2016-05-15 NOTE — Telephone Encounter (Signed)
Called and LMOM for Chanel from [facility] to call back with questions that they have on esbriet.

## 2016-05-15 NOTE — Telephone Encounter (Signed)
Spoke with [place]. She was checking on status of PA. I advised her per 04/27/16 phone note: Len Blalock, [place] at 05/14/2016 3:58 PM     Status: Signed       Expand All Collapse All   Called appeals number [phone] to initiate appeal for Esbriet PA (PA # is [id number]), form faxed to appeals department at [phone]. Reference number [id number]. Will await appeal decision.       She wants [place] to call her once we receive an approval/denial. Her # is [phone]. I also called Chanel again and LMTCB

## 2016-05-16 NOTE — Telephone Encounter (Signed)
Spoke with Shawn Cruz, advised that typically Esbriet is titrated up to the maintenance dosage of 801mg  (or 3-267mg  capsules) TID.  I advised that once pt is titrated up to maintenance dose and tolerated well by patient that the patient could switch to 801mg  capsule with provider's approval.  Shawn Cruz aware.  States that because this appeal is expedited, we should hear a response within 3-5 business hours.  Will await call back.

## 2016-05-16 NOTE — Telephone Encounter (Signed)
lmtcb X2 for Chanel with [employer]

## 2016-05-16 NOTE — Telephone Encounter (Signed)
error 

## 2016-05-16 NOTE — Telephone Encounter (Signed)
Natalie with [employer], since ok to switch to higher strength, can the appeal for the lower strength be dropped??? [phone] ext [phone]  Transferred to triage

## 2016-05-22 NOTE — Telephone Encounter (Signed)
I have not yet received any appeal information on this pt- just went through all of my paperwork to verify.

## 2016-05-22 NOTE — Telephone Encounter (Signed)
I have checked MR's look at and up front. Appeal decision is not in either location.  Caryl Pina have you received anything on this appeal? Thanks.

## 2016-05-23 ENCOUNTER — Other Ambulatory Visit (INDEPENDENT_AMBULATORY_CARE_PROVIDER_SITE_OTHER): Payer: Medicare Other

## 2016-05-23 ENCOUNTER — Ambulatory Visit (INDEPENDENT_AMBULATORY_CARE_PROVIDER_SITE_OTHER): Payer: Medicare Other | Admitting: Internal Medicine

## 2016-05-23 ENCOUNTER — Encounter: Payer: Self-pay | Admitting: Internal Medicine

## 2016-05-23 VITALS — BP 130/72 | HR 96 | Ht 67.5 in | Wt 232.2 lb

## 2016-05-23 DIAGNOSIS — R600 Localized edema: Secondary | ICD-10-CM | POA: Insufficient documentation

## 2016-05-23 DIAGNOSIS — Z5181 Encounter for therapeutic drug level monitoring: Secondary | ICD-10-CM

## 2016-05-23 DIAGNOSIS — J84112 Idiopathic pulmonary fibrosis: Secondary | ICD-10-CM | POA: Diagnosis not present

## 2016-05-23 LAB — BASIC METABOLIC PANEL
BUN: 26 mg/dL — AB (ref 6–23)
CALCIUM: 9.5 mg/dL (ref 8.4–10.5)
CO2: 31 meq/L (ref 19–32)
Chloride: 98 mEq/L (ref 96–112)
Creatinine, Ser: 1.41 mg/dL (ref 0.40–1.50)
GFR: 52.29 mL/min — AB (ref >60.00)
GLUCOSE: 99 mg/dL (ref 70–99)
POTASSIUM: 3.9 meq/L (ref 3.5–5.1)
Sodium: 140 mEq/L (ref 135–145)

## 2016-05-23 LAB — HEPATIC FUNCTION PANEL
ALT: 19 U/L (ref 0–53)
AST: 20 U/L (ref 0–37)
Albumin: 4.2 g/dL (ref 3.5–5.2)
Alkaline Phosphatase: 68 U/L (ref 39–117)
Bilirubin, Direct: 0 mg/dL (ref 0.0–0.3)
TOTAL PROTEIN: 8 g/dL (ref 6.0–8.3)
Total Bilirubin: 0.3 mg/dL (ref 0.2–1.2)

## 2016-05-23 MED ORDER — FUROSEMIDE 40 MG PO TABS: ORAL_TABLET | ORAL | Status: DC

## 2016-05-23 NOTE — Patient Instructions (Signed)
ICD-9-CM ICD-10-CM   1. IPF (idiopathic pulmonary fibrosis) (HCC) 516.31 J84.112   2. Encounter for therapeutic drug monitoring V58.83 Z51.81 Hepatic function panel  3. Pedal edema 782.3 R60.0    Clinically stable Glad so far esbriet tolerating ok; Fatigue partly due to esbriet Edema leg likely due to venous insufficiency and not esbriet  Plan Change lasix 40mg  twice daily to 60mg  AM and 40mg PM  Check lft and bmet 05/23/2016    followup 1 month repeat LFT 1 month to review progress - ok to see me or APP

## 2016-05-23 NOTE — Telephone Encounter (Addendum)
lmtcb x1 for Natalie with [employer] to check the status of the this appeal.

## 2016-05-23 NOTE — Progress Notes (Signed)
Subjective:     Patient ID: Shawn Cruz, male   DOB: 12-Feb-1941, 75 y.o.   MRN: [id number]  HPI 5/31/2017Follow Up Appointment: Pt. Presents to the office for follow up. He feels he is more short of breath, especially with exertion. He also states he is fatigued easily.[name] He is taking 200 mg of Ofab daily. He is tolerating the 200 mg dose well. He was not tolerating the 300 mg dose.There has been a decline in PFT's over the last year of about 15%. We will discuss changing medication from Ofab to Esbriate.   Tests: PFT's 04/04/2016:  FVC: 2.18/ 56% Decline over last year which was 2.34/59%   OV 05/23/2016  Chief Complaint  Patient presents with  . Follow-up    pt states his SOB is stable- has been on esbriet X30 days, only side effect noticed is that increased fatigue and L foot swelling.  Pt has noticed swelling X2 wks.      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 Overall he is tolerating it fine. He wears sunscreen when he goes out for a prolonged period of time. No skin issues. He is having some fatigue. There no GI side effects. He is also noticing some worsening ankle edema particularly on the left side associated with foot edema in the last few weeks. He is wondering if it's related to Pirfenidone (Esbriet). He is on Lasix for chronic venous insufficiency and pedal edema at 40 mg twice a day. This no fever or chills or nausea vomiting. The fatigue is slightly worse than baseline. It is moderate in severity. He has never wanted her to pulmonary rehabilitation due to cost. In terms of dyspnea this is stable    has a past medical history of DDD (degenerative disc disease); Hyperglycemia; High cholesterol; Hypertension; Shortness of breath dyspnea; Diabetes mellitus; Sleep apnea; Anxiety; GERD (gastroesophageal reflux disease); Fibromyalgia; Gout; and Chronic kidney disease.   reports that  he quit smoking about 10 years ago. His smoking use included Cigarettes. He has a 50 pack-year smoking history. He has quit using smokeless tobacco. His smokeless tobacco use included Chew.  Past Surgical History  Procedure Laterality Date  . Lumbar discetomy      1977  . Rotator cuff repair      Dr. Durward Fortes  . Back surrgery      2013  . Left heart catheterization with coronary angiogram N/A 01/11/2015    Procedure: LEFT HEART CATHETERIZATION WITH CORONARY ANGIOGRAM;  Surgeon: Adrian Prows, MD;  Location: [facility] CATH LAB;  Service: Cardiovascular;  Laterality: N/A;  . Back surgery      total of 3 surgeries  . Lithotripsy    . Removal of kidney stone      thru surgery  . Cardiac catheterization      01/2015  . Eye surgery      bilateral cataracts  . Hernia repair      upper ventral hernia (had as child)  . Video bronchoscopy N/A 04/13/2015    Procedure: VIDEO BRONCHOSCOPY;  Surgeon: [name], MD;  Location: [place];  Service: Thoracic;  Laterality: N/A;  . Video assisted thoracoscopy Left 04/13/2015    Procedure: VIDEO ASSISTED THORACOSCOPY;  Surgeon: [name], MD;  Location: [place];  Service: Thoracic;  Laterality: Left;  . Lung biopsy Left 04/13/2015    Procedure: LUNG BIOPSY;  Surgeon: [name], MD;  Location: [place];  Service: Thoracic;  Laterality: Left;    Allergies  Allergen Reactions  . Sulfonamide Derivatives     REACTION: Age 11, leg swelling    Immunization History  Administered Date(s) Administered  . Influenza Split 07/06/2014  . Influenza Whole 07/26/2011  . Influenza,inj,Quad PF,36+ Mos 08/04/2013  . Influenza-Unspecified 07/22/2015  . Pneumococcal Conjugate-13 08/05/2014  . Pneumococcal Polysaccharide-23 08/22/2012    Family History  Problem Relation Age of Onset  . Hypertension Mother   . Heart attack Mother     age 40's  . Hypertension Father   . Heart attack Father     age 67's  . Aortic aneurysm Sister      Current outpatient  prescriptions:  .  allopurinol (ZYLOPRIM) 300 MG tablet, Take 300 mg by mouth daily., Disp: , Rfl:  .  aspirin 325 MG tablet, Take 325 mg by mouth daily., Disp: , Rfl:  .  atorvastatin (LIPITOR) 10 MG tablet, Take 10 mg by mouth daily at 6 PM. , Disp: , Rfl:  .  chlorpheniramine (CHLOR-TRIMETON) 4 MG tablet, Take 4 mg by mouth 2 (two) times daily as needed for allergies., Disp: , Rfl:  .  Cholecalciferol (VITAMIN D) 2000 UNITS CAPS, Take 1,000 Units by mouth daily. , Disp: , Rfl:  .  citalopram (CELEXA) 40 MG tablet, Take 1 tablet by mouth daily., Disp: , Rfl: 6 .  colchicine (COLCRYS) 0.6 MG tablet, TAKE 0.6 MG BY MOUTH DAILY AS NEEDED FOR GOUT FLARE UP, Disp: 30 tablet, Rfl: 0 .  cyanocobalamin 100 MCG tablet, Take 100 mcg by mouth daily., Disp: , Rfl:  .  furosemide (LASIX) 40 MG tablet, TAKE 1 TABLET BY MOUTH TWICE DAILY, Disp: 60 tablet, Rfl: 0 .  HYDROcodone-acetaminophen (NORCO) 10-325 MG per tablet, Take 1 tablet by mouth every 6 (six) hours as needed., Disp: , Rfl:  .  Loperamide HCl (IMODIUM PO), Take by mouth., Disp: , Rfl:  .  Pirfenidone (ESBRIET) 267 MG CAPS, Take 3 capsules by mouth 3 (three) times daily., Disp: , Rfl:     Review of Systems     Objective:   Physical Exam  Constitutional: He is oriented to person, place, and time. He appears well-developed and well-nourished. No distress.  HENT:  Head: Normocephalic and atraumatic.  Right Ear: External ear normal.  Left Ear: External ear normal.  Mouth/Throat: Oropharynx is clear and moist. No oropharyngeal exudate.  Eyes: Conjunctivae and EOM are normal. Pupils are equal, round, and reactive to light. Right eye exhibits no discharge. Left eye exhibits no discharge. No scleral icterus.  Neck: Normal range of motion. Neck supple. No JVD present. No tracheal deviation present. No thyromegaly present.  Cardiovascular: Normal rate, regular rhythm and intact distal pulses.  Exam reveals no gallop and no friction rub.   No  murmur heard. Pulmonary/Chest: Effort normal. No respiratory distress. He has no wheezes. He has rales. He exhibits no tenderness.  Abdominal: Soft. Bowel sounds are normal. He exhibits no distension and no mass. There is no tenderness. There is no rebound and no guarding.  Musculoskeletal: Normal range of motion. He exhibits edema. He exhibits no tenderness.  He has 1+ pitting pedal edema on both sides. It appears slightly more prominent on the left ankle. There is chronic venous insufficiency and chronic for circulation arterial on both feet that are equal.  Lymphadenopathy:    He has no cervical adenopathy.  Neurological: He is alert and oriented to person, place, and time. He has normal reflexes. No  cranial nerve deficit. Coordination normal.  Skin: Skin is warm and dry. No rash noted. He is not diaphoretic. No erythema. No pallor.  Psychiatric: He has a normal mood and affect. His behavior is normal. Judgment and thought content normal.  Nursing note and vitals reviewed.   Filed Vitals:   05/23/16 1331  BP: 130/72  Pulse: 96  Height: 5' 7.5" (1.715 m)  Weight: 232 lb 3.2 oz (105.325 kg)  SpO2: 96%  96% on 3L [place]  Estimated body mass index is 35.81 kg/(m^2) as calculated from the following:   Height as of this encounter: 5' 7.5" (1.715 m).   Weight as of this encounter: 232 lb 3.2 oz (105.325 kg).      Assessment:       ICD-9-CM ICD-10-CM   1. IPF (idiopathic pulmonary fibrosis) (HCC) 516.31 J84.112   2. Encounter for therapeutic drug monitoring V58.83 Z51.81 Hepatic function panel  3. Pedal edema 782.3 R60.0        Plan:      Clinically stableIdiopathic pulmonary fibrosis.  Glad so far esbriet tolerating ok; Fatigue partly due to esbriet Edema leg likely due to venous insufficiency and not esbriet  Plan Change lasix 40mg  twice daily to 60mg  AM and 40mg PM  Check lft and bmet 05/23/2016  continue oxygen Continue esbriet    followup 1 month repeat LFT - needs  mostly followed for 3 months because most of the side effects with this. Happen in the first 3 months  1 month to review progress - ok to see me or APP   Dr. Brand Males, M.D., [facility].C.P Pulmonary and Critical Care Medicine Staff Physician [place] [facility] Pager: [phone], If no answer or between  15:00h - 7:00h: call [phone]  05/23/2016 1:51 PM

## 2016-05-24 NOTE — Telephone Encounter (Signed)
Spoke with Doren Custard at [facility], made aware that pt's appeal has been approved.  Nothing further needed.

## 2016-05-24 NOTE — Telephone Encounter (Signed)
Called and spoke with Chanel from [facility]--  She stated that the esbriet has been approved for 270 tablets for a 30 day supply or 9 per day.  Will forward to [place] to make her aware.

## 2016-05-24 NOTE — Telephone Encounter (Signed)
Calling needing stating that they hadn't received anything about pt med and it was cut off on 6/28, ref # [id number] can be reached @ # [phone] (phillip) anyone who answers will be able to help.Hillery Hunter

## 2016-05-28 ENCOUNTER — Telehealth: Payer: Self-pay | Admitting: Internal Medicine

## 2016-05-28 NOTE — Telephone Encounter (Signed)
Spoke with pt.  He states that he received a call from [facility] on 05/25/16 and said they could not ship Esbriet until they received clarification from our office regarding quantity.  I called and spoke with pharmacist at [facility] [phone]) and clarified the strength, dose and quantity of Esbriet.  They will contact pt today to get this med shipped to him.  Spoke with pt and made him aware that they will be calling him.  Nothing further needed at this time.

## 2016-05-30 ENCOUNTER — Telehealth: Payer: Self-pay | Admitting: Internal Medicine

## 2016-05-30 NOTE — Telephone Encounter (Signed)
Called and spoke with  Shawn Cruz  The pharmacist and she stated that the PA was done for the esbriet and this was approved for the tablets and not the capsules.  The rx that was sent in was for capsules.  They had to have the ok to change the tablets to capsules.  Nothing further is needed.

## 2016-06-01 ENCOUNTER — Telehealth: Payer: Self-pay | Admitting: Internal Medicine

## 2016-06-01 NOTE — Telephone Encounter (Signed)
Spoke with pt, states he has not yet received his Esbriet shipment.  Pt had called pharmacy and was told it was because it was unclear if he needed tablets or capsules delivered.  This was clarified 2 days ago- see previous telephone encounter. Called [facility] and spoke with Katrina, who verified that rx is ready to be shipped but they need to speak to pt to verify copay information.  I asked for them to reach out to pt asap.  Katrina verified that they would reach out to pt today.   Called pt again to relay this info to him- he expressed understanding.  Nothing further needed at this time.

## 2016-06-27 ENCOUNTER — Other Ambulatory Visit (INDEPENDENT_AMBULATORY_CARE_PROVIDER_SITE_OTHER): Payer: Medicare Other

## 2016-06-27 ENCOUNTER — Ambulatory Visit (INDEPENDENT_AMBULATORY_CARE_PROVIDER_SITE_OTHER): Payer: Medicare Other | Admitting: Internal Medicine

## 2016-06-27 ENCOUNTER — Encounter: Payer: Self-pay | Admitting: Internal Medicine

## 2016-06-27 VITALS — BP 118/80 | HR 84 | Ht 68.0 in | Wt 234.0 lb

## 2016-06-27 DIAGNOSIS — Z5181 Encounter for therapeutic drug level monitoring: Secondary | ICD-10-CM

## 2016-06-27 DIAGNOSIS — J84112 Idiopathic pulmonary fibrosis: Secondary | ICD-10-CM | POA: Diagnosis not present

## 2016-06-27 LAB — HEPATIC FUNCTION PANEL
ALK PHOS: 71 U/L (ref 39–117)
ALT: 23 U/L (ref 0–53)
AST: 22 U/L (ref 0–37)
Albumin: 4.2 g/dL (ref 3.5–5.2)
BILIRUBIN DIRECT: 0 mg/dL (ref 0.0–0.3)
BILIRUBIN TOTAL: 0.3 mg/dL (ref 0.2–1.2)
TOTAL PROTEIN: 8 g/dL (ref 6.0–8.3)

## 2016-06-27 NOTE — Progress Notes (Signed)
Subjective:     Patient ID: Shawn Cruz, male   DOB: Oct 18, 1941, 75 y.o.   MRN: [id number]  HPI  5/31/2017Follow Up Appointment: Pt. Presents to the office for follow up. He feels he is more short of breath, especially with exertion. He also states he is fatigued easily.[name] He is taking 200 mg of Ofab daily. He is tolerating the 200 mg dose well. He was not tolerating the 300 mg dose.There has been a decline in PFT's over the last year of about 15%. We will discuss changing medication from Ofab to Esbriate.   Tests: PFT's 04/04/2016:  FVC: 2.18/ 56% Decline over last year which was 2.34/59%   OV 05/23/2016  Chief Complaint  Patient presents with  . Follow-up    pt states his SOB is stable- has been on esbriet X30 days, only side effect noticed is that increased fatigue and L foot swelling.  Pt has noticed swelling X2 wks.      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 Overall he is tolerating it fine. He wears sunscreen when he goes out for a prolonged period of time. No skin issues. He is having some fatigue. There no GI side effects. He is also noticing some worsening ankle edema particularly on the left side associated with foot edema in the last few weeks. He is wondering if it's related to Pirfenidone (Esbriet). He is on Lasix for chronic venous insufficiency and pedal edema at 40 mg twice a day. This no fever or chills or nausea vomiting. The fatigue is slightly worse than baseline. It is moderate in severity. He has never wanted her to pulmonary rehabilitation due to cost. In terms of dyspnea this is stable   OV 06/27/2016  Chief Complaint  Patient presents with  . Idiopathic Pulmonary Fibrosis    Breathing is worse since last OV. Reports increased SOB, dry cough. Denies chest tightness, wheezing.    FU IPF with chronic hypoxemic resp fialure pn esbeit since June  2017  Tolerating esbriet fine. No skin issues. Having GERD per wife though he denied it. Relieved by TUMS. Not taking PPI; wife feels he needs to be on PPI. Overall feels dyspnea is worse but denies chest pain or edema. Stil uses 3L [place] and does not desaturate below 89% on self monitoring.         has a past medical history of Anxiety; Chronic kidney disease; DDD (degenerative disc disease); Diabetes mellitus; Fibromyalgia; GERD (gastroesophageal reflux disease); Gout; High cholesterol; Hyperglycemia; Hypertension; Shortness of breath dyspnea; and Sleep apnea.   reports that he quit smoking about 10 years ago. His smoking use included Cigarettes. He has a 50.00 pack-year smoking history. He has quit using smokeless tobacco. His smokeless tobacco use included Chew.  Past Surgical History:  Procedure Laterality Date  . BACK SURGERY     total of 3 surgeries  . back surrgery     2013  . CARDIAC CATHETERIZATION     01/2015  . EYE SURGERY     bilateral cataracts  . HERNIA REPAIR     upper ventral hernia (had as child)  . LEFT HEART CATHETERIZATION WITH CORONARY ANGIOGRAM N/A 01/11/2015   Procedure: LEFT HEART CATHETERIZATION WITH CORONARY ANGIOGRAM;  Surgeon: Adrian Prows, MD;  Location: [facility] CATH LAB;  Service: Cardiovascular;  Laterality: N/A;  . LITHOTRIPSY    . lumbar discetomy     1977  . LUNG BIOPSY Left  04/13/2015   Procedure: LUNG BIOPSY;  Surgeon: [name], MD;  Location: [place];  Service: Thoracic;  Laterality: Left;  . removal of kidney stone     thru surgery  . rotator cuff repair     Dr. Durward Fortes  . VIDEO ASSISTED THORACOSCOPY Left 04/13/2015   Procedure: VIDEO ASSISTED THORACOSCOPY;  Surgeon: [name], MD;  Location: [place];  Service: Thoracic;  Laterality: Left;  [name] VIDEO BRONCHOSCOPY N/A 04/13/2015   Procedure: VIDEO BRONCHOSCOPY;  Surgeon: [name], MD;  Location: [facility] OR;  Service: Thoracic;  Laterality: N/A;    Allergies  Allergen Reactions  .  Sulfonamide Derivatives     REACTION: Age 89, leg swelling    Immunization History  Administered Date(s) Administered  . Influenza Split 07/06/2014  . Influenza Whole 07/26/2011  . Influenza,inj,Quad PF,36+ Mos 08/04/2013  . Influenza-Unspecified 07/22/2015  . Pneumococcal Conjugate-13 08/05/2014  . Pneumococcal Polysaccharide-23 08/22/2012    Family History  Problem Relation Age of Onset  . Hypertension Mother   . Heart attack Mother     age 55's  . Hypertension Father   . Heart attack Father     age 65's  . Aortic aneurysm Sister      Current Outpatient Prescriptions:  .  allopurinol (ZYLOPRIM) 300 MG tablet, Take 300 mg by mouth daily., Disp: , Rfl:  .  aspirin 325 MG tablet, Take 325 mg by mouth daily., Disp: , Rfl:  .  atorvastatin (LIPITOR) 10 MG tablet, Take 10 mg by mouth daily at 6 PM. , Disp: , Rfl:  .  chlorpheniramine (CHLOR-TRIMETON) 4 MG tablet, Take 4 mg by mouth 2 (two) times daily as needed for allergies., Disp: , Rfl:  .  Cholecalciferol (VITAMIN D) 2000 UNITS CAPS, Take 1,000 Units by mouth daily. , Disp: , Rfl:  .  citalopram (CELEXA) 40 MG tablet, Take 1 tablet by mouth daily., Disp: , Rfl: 6 .  colchicine (COLCRYS) 0.6 MG tablet, TAKE 0.6 MG BY MOUTH DAILY AS NEEDED FOR GOUT FLARE UP, Disp: 30 tablet, Rfl: 0 .  cyanocobalamin 100 MCG tablet, Take 100 mcg by mouth daily., Disp: , Rfl:  .  furosemide (LASIX) 40 MG tablet, 1.5 tablets in morning, 1 tablet in evening., Disp: 75 tablet, Rfl: 2 .  HYDROcodone-acetaminophen (NORCO) 10-325 MG per tablet, Take 1 tablet by mouth every 6 (six) hours as needed., Disp: , Rfl:  .  Loperamide HCl (IMODIUM PO), Take by mouth., Disp: , Rfl:  .  Pirfenidone (ESBRIET) 267 MG CAPS, Take 3 capsules by mouth 3 (three) times daily., Disp: , Rfl:    Review of Systems     Objective:   Physical Exam  Constitutional: He is oriented to person, place, and time. He appears well-developed and well-nourished. No distress.  HENT:   Head: Normocephalic and atraumatic.  Right Ear: External ear normal.  Left Ear: External ear normal.  Mouth/Throat: Oropharynx is clear and moist. No oropharyngeal exudate.  Eyes: Conjunctivae and EOM are normal. Pupils are equal, round, and reactive to light. Right eye exhibits no discharge. Left eye exhibits no discharge. No scleral icterus.  Neck: Normal range of motion. Neck supple. No JVD present. No tracheal deviation present. No thyromegaly present.  Cardiovascular: Normal rate, regular rhythm and intact distal pulses.  Exam reveals no gallop and no friction rub.   No murmur heard. Pulmonary/Chest: Effort normal. No respiratory distress. He has no wheezes. He has rales. He exhibits no tenderness.  Abdominal: Soft. Bowel  sounds are normal. He exhibits no distension and no mass. There is no tenderness. There is no rebound and no guarding.  Musculoskeletal: Normal range of motion. He exhibits no edema or tenderness.  Lymphadenopathy:    He has no cervical adenopathy.  Neurological: He is alert and oriented to person, place, and time. He has normal reflexes. No cranial nerve deficit. Coordination normal.  Skin: Skin is warm and dry. No rash noted. He is not diaphoretic. No erythema. No pallor.  Psychiatric: He has a normal mood and affect. His behavior is normal. Judgment and thought content normal.  Nursing note and vitals reviewed.   Vitals:   06/27/16 1204 06/27/16 1205  BP:  118/80  Pulse:  84  SpO2:  94%  Weight: 234 lb (106.1 kg)   Height: 5\' 8"  (1.727 m)    Estimated body mass index is 35.58 kg/m as calculated from the following:   Height as of this encounter: 5\' 8"  (1.727 m).   Weight as of this encounter: 234 lb (106.1 kg).       Assessment:       ICD-9-CM ICD-10-CM   1. IPF (idiopathic pulmonary fibrosis) (HCC) 516.31 J84.112   2. Encounter for therapeutic drug monitoring V58.83 Z51.81        Plan:      Continue to monitor IPF progression Continue esbriet  but do form to change to 1 pill three times daily (same dose but 3 pills rolled into 1) Check LFT 06/27/2016  Flu shot in fall Do spirometry and dlco 3 months from now REturn to see me in 3 months after spirometry    Dr. Brand Males, M.D., [facility].C.P Pulmonary and Critical Care Medicine Staff Physician [place] [facility] Pager: [phone], If no answer or between  15:00h - 7:00h: call [phone]  06/27/2016 2:18 PM

## 2016-06-27 NOTE — Patient Instructions (Addendum)
ICD-9-CM ICD-10-CM   1. IPF (idiopathic pulmonary fibrosis) (HCC) 516.31 J84.112   2. Encounter for therapeutic drug monitoring V58.83 Z51.81    Continue to monitor IPF progression Continue esbriet but do form to change to 1 pill three times daily (same dose but 3 pills rolled into 1) Check LFT 06/27/2016  OTC prilosec or zegerid for acid reflux which Is due to esbeit Flu shot in fall Do spirometry and dlco 3 months from now  Followup REturn to see me in 3 months after spirometry

## 2016-06-28 NOTE — Progress Notes (Signed)
lmtcb for pt.  

## 2016-06-29 NOTE — Progress Notes (Signed)
Called and spoke to pt. Informed him of the results per MR. Pt verbalized understanding and denied any further questions or concerns at this time.  

## 2016-07-05 ENCOUNTER — Telehealth: Payer: Self-pay | Admitting: Internal Medicine

## 2016-07-05 NOTE — Telephone Encounter (Signed)
I spoke with the pt and advised okay to get the flu shot now  Nothing further needed

## 2016-08-07 ENCOUNTER — Other Ambulatory Visit: Payer: Self-pay | Admitting: *Deleted

## 2016-08-07 ENCOUNTER — Ambulatory Visit (INDEPENDENT_AMBULATORY_CARE_PROVIDER_SITE_OTHER): Payer: Medicare Other | Admitting: Sports Medicine

## 2016-08-07 VITALS — Resp 18 | Ht 68.0 in | Wt 230.0 lb

## 2016-08-07 DIAGNOSIS — M79675 Pain in left toe(s): Secondary | ICD-10-CM | POA: Diagnosis not present

## 2016-08-07 DIAGNOSIS — L603 Nail dystrophy: Secondary | ICD-10-CM

## 2016-08-07 DIAGNOSIS — I739 Peripheral vascular disease, unspecified: Secondary | ICD-10-CM

## 2016-08-07 NOTE — Progress Notes (Signed)
Subjective: Shawn Cruz is a 75 y.o. male patient seen today in office with complaint of painful thickened and elongated toenail left big toenail; unable to trim. Patient states that nail is loose and wants it trimmed off. Patient has no other pedal complaints at this time.   Patient Active Problem List   Diagnosis Date Noted  . Pedal edema 05/23/2016  . IPF (idiopathic pulmonary fibrosis) ([place]) 07/27/2015  . Encounter for therapeutic drug monitoring 07/27/2015  . History of lung biopsy 04/13/2015  . ILD (interstitial lung disease) ([place]) 12/23/2014  . Dyspnea and respiratory abnormality 12/09/2014  . Bibasilar crackles 12/09/2014  . Gout attack 10/08/2013  . Routine general medical examination at a health care facility 03/05/2013  . Bronchitis 01/21/2012  . Influenza-like illness 01/17/2012  . Skin cancer of arm 11/27/2011  . 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 Diabetes mellitus type 2 in obese ([place]) 11/27/2011  . Polyphagia(783.6) 08/02/2011  . Gout 07/19/2011  . Hyperglycemia 05/18/2011  . ANEMIA 01/03/2011  . RENAL INSUFFICIENCY 01/03/2011  . PRESSURE ULCER OTHER SITE 01/03/2011  . HYPERLIPIDEMIA 11/07/2010  . RESTLESS LEG SYNDROME 11/07/2010  . HYPERTENSION 11/07/2010  . DEGENERATIVE DISC DISEASE, LUMBOSACRAL SPINE 11/07/2010  . MUSCLE PAIN 11/07/2010    Current Outpatient Prescriptions on File Prior to Visit  Medication Sig Dispense Refill  . allopurinol (ZYLOPRIM) 300 MG tablet Take 300 mg by mouth daily.    [name] aspirin 325 MG tablet Take 325 mg by mouth daily.    [name] atorvastatin (LIPITOR) 10 MG tablet Take 10 mg by mouth daily at 6 PM.     . chlorpheniramine (CHLOR-TRIMETON) 4 MG tablet Take 4 mg by mouth 2 (two) times daily as needed for allergies.    . Cholecalciferol (VITAMIN D) 2000 UNITS CAPS Take 1,000 Units by mouth daily.     . citalopram (CELEXA) 40 MG tablet Take 1 tablet by mouth daily.  6  . colchicine (COLCRYS) 0.6 MG tablet TAKE 0.6 MG BY MOUTH  DAILY AS NEEDED FOR GOUT FLARE UP 30 tablet 0  . cyanocobalamin 100 MCG tablet Take 100 mcg by mouth daily.    . furosemide (LASIX) 40 MG tablet 1.5 tablets in morning, 1 tablet in evening. 75 tablet 2  . HYDROcodone-acetaminophen (NORCO) 10-325 MG per tablet Take 1 tablet by mouth every 6 (six) hours as needed.    . Loperamide HCl (IMODIUM PO) Take by mouth.    . Pirfenidone (ESBRIET) 267 MG CAPS Take 3 capsules by mouth 3 (three) times daily.     No current facility-administered medications on file prior to visit.     Allergies  Allergen Reactions  . Sulfonamide Derivatives     REACTION: Age 61, leg swelling    Objective: Physical Exam  General: Well developed, nourished, no acute distress, awake, alert and oriented x 3  Vascular: Dorsalis pedis artery 1/4 bilateral faint, Posterior tibial artery 0/4 bilateral, skin temperature warm to cool proximal to distal bilateral lower extremities, right>left, mild varicosities, no pedal hair present bilateral. No ischemia or gangrene bilateral.   Neurological: Gross sensation present via light touch bilateral.   Dermatological: Skin is dry, and supple bilateral, Nails 1-10 are short, thick, and discolored with mild subungal debris with dry blood and distal lifting of left hallux and 3rd toe nails, no webspace macerations present bilateral, no open lesions present bilateral, no callus/corns/hyperkeratotic tissue present bilateral. No signs of infection bilateral.  Musculoskeletal: No symptomatic boney deformities noted bilateral. Muscular strength within normal limits without  painon range of motion. No pain with calf compression bilateral.  Assessment and Plan:  Problem List Items Addressed This Visit    None    Visit Diagnoses    Nail dystrophy    -  Primary   Pain in left toe(s)       PVD (peripheral vascular disease) ([place])         -Examined patient.  -Discussed treatment options for painful dystrophic nails. -Mechanically debrided  left hallux and 3rd toe nails removing loose portions with sterile nail nipper and dremel nail file without incident. -Advised patient to closely monitor nails and may regrow thickened and lifted  -Patient to return as needed or sooner if symptoms worsen.  Landis Martins, DPM

## 2016-09-12 ENCOUNTER — Telehealth: Payer: Self-pay | Admitting: Internal Medicine

## 2016-09-12 NOTE — Telephone Encounter (Signed)
Called and spoke with pts wife and she stated that when they were in to MR he told them that they would start the process of getting the esbriet dose changed to where he would only have to take 3 tablets daily as opposed to 9 dailly.  MR please advise of this paper work.  Will forward to elise to follow up on paperwork.

## 2016-09-13 NOTE — Telephone Encounter (Signed)
Daneil Dan can you address please  Thanks  Dr. Brand Males, M.D., [facility].C.P Pulmonary and Critical Care Medicine Staff Physician [place] [facility] Pager: [phone], If no answer or between  15:00h - 7:00h: call [phone]  09/13/2016 12:20 AM

## 2016-09-13 NOTE — Telephone Encounter (Signed)
Called and spoke to [place] with [facility]. Lanette states she will have a rep that is specific to our district call back with the pt's specialty pharmacy information. The patient will need to be changed from 267mg  Esbriet capsule (currently taking 3 capsules TID) to 801mg  capsule TID. A verbal order is all that is needed at the specialty pharmacy. Once phone number is obtained I will call to give VO for Esbriet 801mg  capsule/tablet TID.

## 2016-09-13 NOTE — Telephone Encounter (Signed)
Called and spoke to pt. Pt states he uses [facility]. Called [facility] and was advised pt does not use [facility]. Will call pt back and look back in chart to see what pharmacy he uses.

## 2016-09-18 MED ORDER — PIRFENIDONE 801 MG PO TABS: 1.0000 | ORAL_TABLET | Freq: Three times a day (TID) | ORAL | 11 refills | Status: DC

## 2016-09-18 NOTE — Telephone Encounter (Signed)
Called and spoke to pt's wife. She states pt is using [facility] at [phone]. Called and spoke to pharmacist and gave VO for Esbriet 801mg  1 PO TID. Nothing further needed at this time.

## 2016-10-01 ENCOUNTER — Ambulatory Visit (INDEPENDENT_AMBULATORY_CARE_PROVIDER_SITE_OTHER): Payer: Medicare Other | Admitting: Internal Medicine

## 2016-10-01 ENCOUNTER — Other Ambulatory Visit: Payer: Medicare Other

## 2016-10-01 ENCOUNTER — Encounter: Payer: Self-pay | Admitting: Internal Medicine

## 2016-10-01 VITALS — BP 114/70 | HR 73 | Ht 68.0 in | Wt 230.0 lb

## 2016-10-01 DIAGNOSIS — J9611 Chronic respiratory failure with hypoxia: Secondary | ICD-10-CM

## 2016-10-01 DIAGNOSIS — J84112 Idiopathic pulmonary fibrosis: Secondary | ICD-10-CM

## 2016-10-01 DIAGNOSIS — Z5181 Encounter for therapeutic drug level monitoring: Secondary | ICD-10-CM | POA: Diagnosis not present

## 2016-10-01 LAB — PULMONARY FUNCTION TEST
DL/VA % PRED: 79 %
DL/VA: 3.53 ml/min/mmHg/L
DLCO cor % pred: 45 %
DLCO cor: 13.09 ml/min/mmHg
DLCO unc % pred: 45 %
DLCO unc: 13.06 ml/min/mmHg
FEF 25-75 Pre: 1.62 L/sec
FEF2575-%Pred-Pre: 79 %
FEV1-%PRED-PRE: 59 %
FEV1-PRE: 1.67 L
FEV1FVC-%Pred-Pre: 109 %
FEV6-%Pred-Pre: 57 %
FEV6-Pre: 2.09 L
FEV6FVC-%PRED-PRE: 106 %
FVC-%PRED-PRE: 54 %
FVC-PRE: 2.09 L
Pre FEV1/FVC ratio: 80 %
Pre FEV6/FVC Ratio: 100 %

## 2016-10-01 NOTE — Progress Notes (Signed)
Subjective:     Patient ID: Shawn Cruz, male   DOB: 1941/04/18, 75 y.o.   MRN: [id number]  HPI    5/31/2017Follow Up Appointment: Pt. Presents to the office for follow up. He feels he is more short of breath, especially with exertion. He also states he is fatigued easily.[name] He is taking 200 mg of Ofab daily. He is tolerating the 200 mg dose well. He was not tolerating the 300 mg dose.There has been a decline in PFT's over the last year of about 15%. We will discuss changing medication from Ofab to Esbriate.   Tests: PFT's 04/04/2016:  FVC: 2.18/ 56% Decline over last year which was 2.34/59%   OV 05/23/2016  Chief Complaint  Patient presents with  . Follow-up    pt states his SOB is stable- has been on esbriet X30 days, only side effect noticed is that increased fatigue and L foot swelling.  Pt has noticed swelling X2 wks.      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 Overall he is tolerating it fine. He wears sunscreen when he goes out for a prolonged period of time. No skin issues. He is having some fatigue. There no GI side effects. He is also noticing some worsening ankle edema particularly on the left side associated with foot edema in the last few weeks. He is wondering if it's related to Pirfenidone (Esbriet). He is on Lasix for chronic venous insufficiency and pedal edema at 40 mg twice a day. This no fever or chills or nausea vomiting. The fatigue is slightly worse than baseline. It is moderate in severity. He has never wanted her to pulmonary rehabilitation due to cost. In terms of dyspnea this is stable   OV 06/27/2016  Chief Complaint  Patient presents with  . Idiopathic Pulmonary Fibrosis    Breathing is worse since last OV. Reports increased SOB, dry cough. Denies chest tightness, wheezing.    FU IPF with chronic hypoxemic resp fialure pn esbeit since June  2017  Tolerating esbriet fine. No skin issues. Having GERD per wife though he denied it. Relieved by TUMS. Not taking PPI; wife feels he needs to be on PPI. Overall feels dyspnea is worse but denies chest pain or edema. Stil uses 3L [place] and does not desaturate below 89% on self monitoring.      OV 10/01/2016   Chief Complaint  Patient presents with  . Follow-up    for PFT results. Pt. states he is breathing about the same. Wife states the opposite. Breathing is worse.    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 Fine. No skin issues. However wife begins to think that he is beginning to have low appetite and some weight loss. However he says because of his obesity visit this is a good thing for him. He tells me that he is stable. This 3 L of oxygen at rest he is able to do his ADLs and only very rarely desaturated to 86%. He says this is stable compared to his last visit in August 2017. Off note he is yet to get his Pirfenidone (Esbriet) at one pill 3 times a day which is the 3 pills rolled and 1 pill. . Wife thinks he is beginning to decline because at night she feels that he is a bit more tachypneic. Pulmonary function test done this visit show  stability compared to May 2017. It appears that he is stable and starting Pirfenidone (Esbriet). His wife is wondering about transplant eligibility   Results for [place], [place] (MRN [id number]) as of 10/01/2016 16:52  Ref. Range 12/14/2014 13:27 03/29/2015 13:05 04/04/2016 12:56 10/01/2016 15:00  FVC-Pre Latest Units: L 2.58 2.34 2.18 2.09  FVC-%Pred-Pre Latest Units: % 65 59 56 54   Results for RUDDY, PACHON (MRN [id number]) as of 10/01/2016 16:52  Ref. Range 12/14/2014 13:27 03/29/2015 13:05 04/04/2016 12:56 10/01/2016 15:00  DLCO unc Latest Units: ml/min/mmHg 12.47 13.24  13.06  DLCO unc % pred Latest Units: % 43 48  45     has a past medical  history of Anxiety; Chronic kidney disease; DDD (degenerative disc disease); Diabetes mellitus; Fibromyalgia; GERD (gastroesophageal reflux disease); Gout; High cholesterol; Hyperglycemia; Hypertension; Shortness of breath dyspnea; and Sleep apnea.   reports that he quit smoking about 10 years ago. His smoking use included Cigarettes. He has a 50.00 pack-year smoking history. He has quit using smokeless tobacco. His smokeless tobacco use included Chew.  Past Surgical History:  Procedure Laterality Date  . BACK SURGERY     total of 3 surgeries  . back surrgery     2013  . CARDIAC CATHETERIZATION     01/2015  . EYE SURGERY     bilateral cataracts  . HERNIA REPAIR     upper ventral hernia (had as child)  . LEFT HEART CATHETERIZATION WITH CORONARY ANGIOGRAM N/A 01/11/2015   Procedure: LEFT HEART CATHETERIZATION WITH CORONARY ANGIOGRAM;  Surgeon: Adrian Prows, MD;  Location: [facility] CATH LAB;  Service: Cardiovascular;  Laterality: N/A;  . LITHOTRIPSY    . lumbar discetomy     1977  . LUNG BIOPSY Left 04/13/2015   Procedure: LUNG BIOPSY;  Surgeon: [name], MD;  Location: [place];  Service: Thoracic;  Laterality: Left;  . removal of kidney stone     thru surgery  . rotator cuff repair     Dr. Durward Fortes  . VIDEO ASSISTED THORACOSCOPY Left 04/13/2015   Procedure: VIDEO ASSISTED THORACOSCOPY;  Surgeon: [name], MD;  Location: [place];  Service: Thoracic;  Laterality: Left;  [name] VIDEO BRONCHOSCOPY N/A 04/13/2015   Procedure: VIDEO BRONCHOSCOPY;  Surgeon: [name], MD;  Location: [facility] OR;  Service: Thoracic;  Laterality: N/A;    Allergies  Allergen Reactions  . Sulfonamide Derivatives     REACTION: Age 71, leg swelling    Immunization History  Administered Date(s) Administered  . Influenza Split 07/06/2014  . Influenza Whole 07/26/2011  . Influenza,inj,Quad PF,36+ Mos 08/04/2013  . Influenza-Unspecified 07/22/2015  . Pneumococcal Conjugate-13 08/05/2014  . Pneumococcal  Polysaccharide-23 08/22/2012    Family History  Problem Relation Age of Onset  . Hypertension Mother   . Heart attack Mother     age 65's  . Hypertension Father   . Heart attack Father     age 45's  . Aortic aneurysm Sister      Current Outpatient Prescriptions:  .  allopurinol (ZYLOPRIM) 300 MG tablet, Take 300 mg by mouth daily., Disp: , Rfl:  .  aspirin 325 MG tablet, Take 325 mg by mouth daily., Disp: , Rfl:  .  atorvastatin (LIPITOR) 10 MG tablet, Take 10 mg by mouth daily at 6 PM. , Disp: , Rfl:  .  chlorpheniramine (CHLOR-TRIMETON) 4 MG tablet, Take 4 mg by mouth 2 (two) times daily as needed for allergies., Disp: , Rfl:  .  Cholecalciferol (VITAMIN D)  2000 UNITS CAPS, Take 1,000 Units by mouth daily. , Disp: , Rfl:  .  citalopram (CELEXA) 40 MG tablet, Take 1 tablet by mouth daily., Disp: , Rfl: 6 .  colchicine (COLCRYS) 0.6 MG tablet, TAKE 0.6 MG BY MOUTH DAILY AS NEEDED FOR GOUT FLARE UP, Disp: 30 tablet, Rfl: 0 .  cyanocobalamin 100 MCG tablet, Take 100 mcg by mouth daily., Disp: , Rfl:  .  furosemide (LASIX) 40 MG tablet, 1.5 tablets in morning, 1 tablet in evening., Disp: 75 tablet, Rfl: 2 .  HYDROcodone-acetaminophen (NORCO) 10-325 MG per tablet, Take 1 tablet by mouth every 6 (six) hours as needed., Disp: , Rfl:  .  Loperamide HCl (IMODIUM PO), Take by mouth., Disp: , Rfl:  .  Pirfenidone (ESBRIET) 801 MG TABS, Take 1 tablet by mouth 3 (three) times daily., Disp: 90 tablet, Rfl: 11   Review of Systems     Objective:   Physical Exam  Vitals:   10/01/16 1612 10/01/16 1613  BP:  114/70  Pulse:  73  SpO2:  96%  Weight: 230 lb (104.3 kg) 230 lb (104.3 kg)  Height: 5\' 8"  (1.727 m) 5\' 8"  (1.727 m)    Estimated body mass index is 34.97 kg/m as calculated from the following:   Height as of this encounter: 5\' 8"  (1.727 m).   Weight as of this encounter: 230 lb (104.3 kg).      Assessment:       ICD-9-CM ICD-10-CM   1. IPF (idiopathic pulmonary fibrosis)  (HCC) 516.31 J84.112   2. Encounter for therapeutic drug monitoring V58.83 Z51.81   3. Chronic hypoxemic respiratory failure (HCC) 518.83 J96.11    799.02         Plan:      Disease likely stable since starting esbriet in may 2017 Monitor low appetite and weight loss with esbriet  Discussed lung transplant: He is of the upper age limit cutt off. In addition he has to raise money which he does not have. In addition he is to pulmonary rehabilitation which is always declined because of financial issues.  PLAN Wife can check pulse at night when sleeping on 3L [place] Continue to monitor IPF progression Continue esbriet but change to 1 pill three times daily (same dose but 3 pills rolled into 1) Check LFT 10/01/2016 OTC prilosec or zegerid for acid reflux which Is due to esbeit Do spirometry and dlco 4 months from now Refer PFF foundation meet - see you at 18.30 Thursday 10/04/16 at [facility] -  I will infoirm Mr Lula Olszewski participation in research trials in the future  Followup REturn to see me in 4 months after spirometry    > 50% of this > 25 min visit spent in face to face counseling or coordination of care   Dr. Brand Males, M.D., [facility].C.P Pulmonary and Critical Care Medicine Staff Physician [place] [facility] Pager: [phone], If no answer or between  15:00h - 7:00h: call [phone]  10/01/2016 5:13 PM

## 2016-10-01 NOTE — Progress Notes (Signed)
pft  Dr. Brand Males, M.D., [facility].C.P Pulmonary and Critical Care Medicine Staff Physician [place] [facility] Pager: [phone], If no answer or between  15:00h - 7:00h: call [phone]  10/01/2016 5:41 PM

## 2016-10-01 NOTE — Addendum Note (Signed)
Addended by: Collier Salina on: 10/01/2016 05:40 PM   Modules accepted: Orders

## 2016-10-01 NOTE — Patient Instructions (Addendum)
ICD-9-CM ICD-10-CM   1. IPF (idiopathic pulmonary fibrosis) (HCC) 516.31 J84.112   2. Encounter for therapeutic drug monitoring V58.83 Z51.81     Disease likely stable since starting esbriet in may 2017 Monitor low appetite and weight loss with esbriet   PLAN Wife can check pulse at night when sleeping on 3L [place] Continue to monitor IPF progression Continue esbriet but change to 1 pill three times daily (same dose but 3 pills rolled into 1) Check LFT 10/01/2016 OTC prilosec or zegerid for acid reflux which Is due to esbeit Do spirometry and dlco 4 months from now Refer PFF foundation meet - see you at 18.30 Thursday 10/04/16 at [facility] -  I will infoirm Mr Lula Olszewski participation in research trials in the future  Followup REturn to see me in 4 months after spirometry

## 2016-10-03 ENCOUNTER — Other Ambulatory Visit (INDEPENDENT_AMBULATORY_CARE_PROVIDER_SITE_OTHER): Payer: Medicare Other

## 2016-10-03 DIAGNOSIS — J84112 Idiopathic pulmonary fibrosis: Secondary | ICD-10-CM

## 2016-10-03 LAB — HEPATIC FUNCTION PANEL
ALK PHOS: 62 U/L (ref 39–117)
ALT: 22 U/L (ref 0–53)
AST: 20 U/L (ref 0–37)
Albumin: 4.1 g/dL (ref 3.5–5.2)
BILIRUBIN DIRECT: 0 mg/dL (ref 0.0–0.3)
TOTAL PROTEIN: 7.6 g/dL (ref 6.0–8.3)
Total Bilirubin: 0.3 mg/dL (ref 0.2–1.2)

## 2016-10-05 NOTE — Progress Notes (Signed)
ATC pt. No option for VM. WCB.  

## 2016-10-31 ENCOUNTER — Other Ambulatory Visit: Payer: Self-pay | Admitting: Internal Medicine

## 2016-11-26 ENCOUNTER — Telehealth: Payer: Self-pay | Admitting: Internal Medicine

## 2016-11-26 MED ORDER — PIRFENIDONE 801 MG PO TABS: 1.0000 | ORAL_TABLET | Freq: Three times a day (TID) | ORAL | 3 refills | Status: DC

## 2016-11-26 NOTE — Telephone Encounter (Signed)
Spoke with the pt  He states pirfenidone is mailed to him from the [facility] in [place]  I have sent rx refill to them electronically  Nothing further needed

## 2016-11-28 ENCOUNTER — Other Ambulatory Visit: Payer: Self-pay | Admitting: Internal Medicine

## 2016-12-01 ENCOUNTER — Other Ambulatory Visit: Payer: Self-pay | Admitting: Internal Medicine

## 2016-12-03 ENCOUNTER — Telehealth: Payer: Self-pay | Admitting: Internal Medicine

## 2016-12-03 NOTE — Telephone Encounter (Signed)
Spoke with pt, states he no longer has a PCP.  Pt wants to know if we have any PCP in our office.  I stated that we do not see PCP pts anymore on this floor, but provided pt with number downstairs for PCP.  Pt will call to schedule appt with PCP office tomorrow.  Nothing further needed at this time.

## 2016-12-28 ENCOUNTER — Other Ambulatory Visit: Payer: Self-pay | Admitting: Internal Medicine

## 2017-02-24 ENCOUNTER — Other Ambulatory Visit: Payer: Self-pay | Admitting: Internal Medicine

## 2017-02-25 ENCOUNTER — Other Ambulatory Visit: Payer: Self-pay | Admitting: Internal Medicine

## 2017-02-25 ENCOUNTER — Telehealth: Payer: Self-pay | Admitting: Pulmonary Disease

## 2017-02-25 DIAGNOSIS — Z5181 Encounter for therapeutic drug level monitoring: Secondary | ICD-10-CM

## 2017-02-25 DIAGNOSIS — J849 Interstitial pulmonary disease, unspecified: Secondary | ICD-10-CM

## 2017-02-25 NOTE — Telephone Encounter (Signed)
Called by Gwyndolyn Saxon who requests a refill on his Furosemide prescription. Prescription for Furosemide 40 mg, Disp # 75, Sig: take 1 and 1/2 tablets by mouth in AM daily and 1 tablet by mouth in PM daily. No refills. I instructed Jaden that in the future he needs to address Furosemide refills with his primary care physician.

## 2017-02-26 NOTE — Telephone Encounter (Signed)
Shawn Cruz   Is on esbriet and no LFT check since nov 2017. Please have him come in and do LFT next few to several days. Also ensure ROV first available with Pre-bd spiro and dlco only. No lung volume or bd response.   Thanks  Dr. Brand Males, M.D., [facility].C.P Pulmonary and Critical Care Medicine Staff Physician [name] [facility] Pager: [phone], If no answer or between  15:00h - 7:00h: call [phone]  02/26/2017 4:08 AM      .

## 2017-02-26 NOTE — Addendum Note (Signed)
Addended by: Len Blalock on: 02/26/2017 10:14 AM   Modules accepted: Orders

## 2017-02-26 NOTE — Telephone Encounter (Signed)
Spoke with pt's wife [name], aware of recs.  Scheduled pft and rov.  Lab ordered.  Nothing further needed.

## 2017-03-19 ENCOUNTER — Other Ambulatory Visit: Payer: Self-pay | Admitting: Internal Medicine

## 2017-03-22 ENCOUNTER — Ambulatory Visit (INDEPENDENT_AMBULATORY_CARE_PROVIDER_SITE_OTHER): Payer: Medicare Other | Admitting: Internal Medicine

## 2017-03-22 ENCOUNTER — Other Ambulatory Visit (INDEPENDENT_AMBULATORY_CARE_PROVIDER_SITE_OTHER): Payer: Medicare Other

## 2017-03-22 DIAGNOSIS — Z5181 Encounter for therapeutic drug level monitoring: Secondary | ICD-10-CM | POA: Diagnosis not present

## 2017-03-22 DIAGNOSIS — J849 Interstitial pulmonary disease, unspecified: Secondary | ICD-10-CM | POA: Diagnosis not present

## 2017-03-22 DIAGNOSIS — J84112 Idiopathic pulmonary fibrosis: Secondary | ICD-10-CM | POA: Diagnosis not present

## 2017-03-22 LAB — PULMONARY FUNCTION TEST
DL/VA % pred: 64 %
DL/VA: 2.86 ml/min/mmHg/L
DLCO UNC: 11 ml/min/mmHg
DLCO cor % pred: 36 %
DLCO cor: 10.66 ml/min/mmHg
DLCO unc % pred: 37 %
FEF 25-75 PRE: 1.88 L/s
FEF2575-%PRED-PRE: 92 %
FEV1-%PRED-PRE: 61 %
FEV1-PRE: 1.72 L
FEV1FVC-%Pred-Pre: 110 %
FEV6-%PRED-PRE: 59 %
FEV6-Pre: 2.13 L
FEV6FVC-%PRED-PRE: 106 %
FVC-%PRED-PRE: 55 %
FVC-PRE: 2.13 L
PRE FEV1/FVC RATIO: 81 %
Pre FEV6/FVC Ratio: 100 %

## 2017-03-22 LAB — HEPATIC FUNCTION PANEL
ALBUMIN: 4.5 g/dL (ref 3.5–5.2)
ALK PHOS: 67 U/L (ref 39–117)
ALT: 18 U/L (ref 0–53)
AST: 20 U/L (ref 0–37)
BILIRUBIN DIRECT: 0.1 mg/dL (ref 0.0–0.3)
TOTAL PROTEIN: 8.3 g/dL (ref 6.0–8.3)
Total Bilirubin: 0.4 mg/dL (ref 0.2–1.2)

## 2017-03-22 NOTE — Progress Notes (Signed)
PFT done today. 

## 2017-03-22 NOTE — Assessment & Plan Note (Signed)
Stable  Plan Continue 3L o2 continuuous Can adjust based on pulse ox goal > 88%

## 2017-03-22 NOTE — Assessment & Plan Note (Addendum)
tolerting esbreit but with mild fatigue and low taste issues  Plan Check LFT 03/22/2017

## 2017-03-22 NOTE — Patient Instructions (Signed)
IPF (idiopathic pulmonary fibrosis) Stable IPF; thanks to [place] as before Check Pre-bd spiro and dlco only. No lung volume or bd response. - do in 4 months from now  Followup ROV with Seaton Hofmann after 4 months but aftrer spirometry  Chronic hypoxemic respiratory failure (HCC) Stable  Plan Continue 3L o2 continuuous Can adjust based on pulse ox goal > 88%  Encounter for therapeutic drug monitoring tolerting esbreit but with mild fatigue and low taste issues  Plan Check LFT 03/22/2017  ROV 3-4 months

## 2017-03-22 NOTE — Progress Notes (Signed)
Subjective:     Patient ID: Shawn Cruz, male   DOB: 04-24-1941, 76 y.o.   MRN: [id number]  HPI   5/31/2017Follow Up Appointment: Pt. Presents to the office for follow up. He feels he is more short of breath, especially with exertion. He also states he is fatigued easily.[name] He is taking 200 mg of Ofab daily. He is tolerating the 200 mg dose well. He was not tolerating the 300 mg dose.There has been a decline in PFT's over the last year of about 15%. We will discuss changing medication from Ofab to Esbriate.   Tests: PFT's 04/04/2016:  FVC: 2.18/ 56% Decline over last year which was 2.34/59%   OV 05/23/2016  Chief Complaint  Patient presents with  . Follow-up    pt states his SOB is stable- has been on esbriet X30 days, only side effect noticed is that increased fatigue and L foot swelling.  Pt has noticed swelling X2 wks.      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 Overall he is tolerating it fine. He wears sunscreen when he goes out for a prolonged period of time. No skin issues. He is having some fatigue. There no GI side effects. He is also noticing some worsening ankle edema particularly on the left side associated with foot edema in the last few weeks. He is wondering if it's related to Pirfenidone (Esbriet). He is on Lasix for chronic venous insufficiency and pedal edema at 40 mg twice a day. This no fever or chills or nausea vomiting. The fatigue is slightly worse than baseline. It is moderate in severity. He has never wanted her to pulmonary rehabilitation due to cost. In terms of dyspnea this is stable   OV 06/27/2016  Chief Complaint  Patient presents with  . Idiopathic Pulmonary Fibrosis    Breathing is worse since last OV. Reports increased SOB, dry cough. Denies chest tightness, wheezing.    FU IPF with chronic hypoxemic resp fialure pn esbeit since June  2017  Tolerating esbriet fine. No skin issues. Having GERD per wife though he denied it. Relieved by TUMS. Not taking PPI; wife feels he needs to be on PPI. Overall feels dyspnea is worse but denies chest pain or edema. Stil uses 3L Vincent and does not desaturate below 89% on self monitoring.      OV 10/01/2016   Chief Complaint  Patient presents with  . Follow-up    for PFT results. Pt. states he is breathing about the same. Wife states the opposite. Breathing is worse.    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 Fine. No skin issues. However wife begins to think that he is beginning to have low appetite and some weight loss. However he says because of his obesity visit this is a good thing for him. He tells me that he is stable. This 3 L of oxygen at rest he is able to do his ADLs and only very rarely desaturated to 86%. He says this is stable compared to his last visit in Aug  ust 2017. Off note he is yet to get his Pirfenidone (Esbriet) at one pill 3 times a day which is the 3 pills rolled and 1 pill. . Wife thinks he is beginning to decline because at night she feels that he is a bit more tachypneic. Pulmonary function test done this visit  show stability compared to May 2017. It appears that he is stable and starting Pirfenidone (Esbriet). His wife is wondering about transplant eligibility      OV 03/22/2017  Chief Complaint  Patient presents with  . 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 There are no skin issues. He is tolerating Pirfenidone (Esbriet) just fine except for mild fatigue and some taste issues. But he definitely feels that the Pirfenidone (Esbriet) is better tolerated than the Ofev. He also feels  that the aspirate has halted progression of disease. Otherwise no new issues. Spirometry and DLCO shows relative stability in the last 1 year.  Results for Shawn Cruz, Shawn Cruz (MRN [id number]) as of 03/22/2017 10:10  Ref. Range 12/14/2014 13:27 03/29/2015 13:46 04/04/2016 13:14 10/01/2016 15:00 03/22/2017 08:56  FVC-Pre Latest Units: L 2.58 2.34 2.18 2.09 2.13  FVC-%Pred-Pre Latest Units: % 65 59 56 54 55   Results for Shawn Cruz, Shawn Cruz (MRN [id number]) as of 03/22/2017 10:10  Ref. Range 12/14/2014 13:27 03/29/2015 13:46 04/04/2016 13:14 10/01/2016 15:00 03/22/2017 08:56  DLCO unc Latest Units: ml/min/mmHg 12.47 13.24  13.06 11.00  DLCO unc % pred Latest Units: % 43 45  45 37     has a past medical history of Anxiety; Chronic kidney disease; DDD (degenerative disc disease); Diabetes mellitus; Fibromyalgia; GERD (gastroesophageal reflux disease); Gout; High cholesterol; Hyperglycemia; Hypertension; Shortness of breath dyspnea; and Sleep apnea.   reports that he quit smoking about 11 years ago. His smoking use included Cigarettes. He has a 50.00 pack-year smoking history. He has quit using smokeless tobacco. His smokeless tobacco use included Chew.  Past Surgical History:  Procedure Laterality Date  . BACK SURGERY     total of 3 surgeries  . back surrgery     2013  . CARDIAC CATHETERIZATION     01/2015  . EYE SURGERY     bilateral cataracts  . HERNIA REPAIR     upper ventral hernia (had as child)  . LEFT HEART CATHETERIZATION WITH CORONARY ANGIOGRAM N/A 01/11/2015   Procedure: LEFT HEART CATHETERIZATION WITH CORONARY ANGIOGRAM;  Surgeon: Adrian Prows, MD;  Location: [facility] CATH LAB;  Service: Cardiovascular;  Laterality: N/A;  . LITHOTRIPSY    . lumbar discetomy     1977  . LUNG BIOPSY Left 04/13/2015   Procedure: LUNG BIOPSY;  Surgeon: [name], MD;  Location: [facility];  Service: Thoracic;  Laterality: Left;  . removal of kidney stone     thru surgery  . rotator cuff repair     Dr. Durward Fortes  .  VIDEO ASSISTED THORACOSCOPY Left 04/13/2015   Procedure: VIDEO ASSISTED THORACOSCOPY;  Surgeon: [name], MD;  Location: [facility];  Service: Thoracic;  Laterality: Left;  [name] VIDEO BRONCHOSCOPY N/A 04/13/2015   Procedure: VIDEO BRONCHOSCOPY;  Surgeon: [name], MD;  Location: [facility] OR;  Service: Thoracic;  Laterality: N/A;    Allergies  Allergen Reactions  . Sulfonamide Derivatives     REACTION: Age 89, leg swelling    Immunization History  Administered Date(s) Administered  . Influenza Split 07/06/2014  . Influenza Whole 07/26/2011  . Influenza,inj,Quad PF,36+ Mos 08/04/2013  . Influenza-Unspecified 07/22/2015  . Pneumococcal Conjugate-13 08/05/2014  . Pneumococcal Polysaccharide-23 08/22/2012    Family History  Problem Relation Age of Onset  . Hypertension Mother   .  Heart attack Mother        age 47's  . Hypertension Father   . Heart attack Father        age 71's  . Aortic aneurysm Sister      Current Outpatient Prescriptions:  .  allopurinol (ZYLOPRIM) 300 MG tablet, Take 300 mg by mouth daily., Disp: , Rfl:  .  aspirin EC 81 MG tablet, Take 81 mg by mouth daily., Disp: , Rfl:  .  atorvastatin (LIPITOR) 10 MG tablet, Take 10 mg by mouth daily at 6 PM. , Disp: , Rfl:  .  chlorpheniramine (CHLOR-TRIMETON) 4 MG tablet, Take 4 mg by mouth 2 (two) times daily as needed for allergies., Disp: , Rfl:  .  Cholecalciferol (VITAMIN D) 2000 UNITS CAPS, Take 5,000 Units by mouth daily. , Disp: , Rfl:  .  citalopram (CELEXA) 40 MG tablet, Take 1 tablet by mouth daily., Disp: , Rfl: 6 .  colchicine (COLCRYS) 0.6 MG tablet, TAKE 0.6 MG BY MOUTH DAILY AS NEEDED FOR GOUT FLARE UP, Disp: 30 tablet, Rfl: 0 .  cyanocobalamin 100 MCG tablet, Take 100 mcg by mouth daily., Disp: , Rfl:  .  ESBRIET 801 MG TABS, TAKE 801MG  BY MOUTH 3 TIMES A DAY WITH FOOD, Disp: 90 tablet, Rfl: 2 .  furosemide (LASIX) 40 MG tablet, TAKE 1 AND 1/2 TABLETS BY MOUTH EVERY MORNING AND 1 TABLET EVERY EVENING,  Disp: 225 tablet, Rfl: 0 .  HYDROcodone-acetaminophen (NORCO) 10-325 MG per tablet, Take 1 tablet by mouth every 6 (six) hours as needed., Disp: , Rfl:  .  Loperamide HCl (IMODIUM PO), Take by mouth., Disp: , Rfl:    Review of Systems     Objective:   Physical Exam  Constitutional: He is oriented to person, place, and time. He appears well-developed and well-nourished. No distress.  HENT:  Head: Normocephalic and atraumatic.  Right Ear: External ear normal.  Left Ear: External ear normal.  Mouth/Throat: Oropharynx is clear and moist. No oropharyngeal exudate.  Eyes: Conjunctivae and EOM are normal. Pupils are equal, round, and reactive to light. Right eye exhibits no discharge. Left eye exhibits no discharge. No scleral icterus.  Neck: Normal range of motion. Neck supple. No JVD present. No tracheal deviation present. No thyromegaly present.  Cardiovascular: Normal rate, regular rhythm and intact distal pulses.  Exam reveals no gallop and no friction rub.   No murmur heard. Pulmonary/Chest: Effort normal. No respiratory distress. He has no wheezes. He has rales. He exhibits no tenderness.  Abdominal: Soft. Bowel sounds are normal. He exhibits no distension and no mass. There is no tenderness. There is no rebound and no guarding.  Musculoskeletal: Normal range of motion. He exhibits no edema or tenderness.  Lymphadenopathy:    He has no cervical adenopathy.  Neurological: He is alert and oriented to person, place, and time. He has normal reflexes. No cranial nerve deficit. Coordination normal.  Skin: Skin is warm and dry. No rash noted. He is not diaphoretic. No erythema. No pallor.  Psychiatric: He has a normal mood and affect. His behavior is normal. Judgment and thought content normal.  Nursing note and vitals reviewed.  Vitals:   03/22/17 0958  BP: 120/78  Pulse: 87  SpO2: 95%    Estimated body mass index is 34.97 kg/m as calculated from the following:   Height as of  10/01/16: 5\' 8"  (1.727 m).   Weight as of 10/01/16: 230 lb (104.3 kg).     Assessment:  ICD-9-CM ICD-10-CM   1. IPF (idiopathic pulmonary fibrosis) (HCC) 516.31 J84.112 Hepatic function panel  2. Encounter for therapeutic drug monitoring V58.83 Z51.81        Plan:     IPF (idiopathic pulmonary fibrosis) Stable IPF; thanks to [place] as before Check Pre-bd spiro and dlco only. No lung volume or bd response. - do in 4 months from now  Followup ROV with Juanjesus Pepperman after 4 months but aftrer spirometry  Chronic hypoxemic respiratory failure (HCC) Stable  Plan Continue 3L o2 continuuous Can adjust based on pulse ox goal > 88%  Encounter for therapeutic drug monitoring tolerting esbreit but with mild fatigue and low taste issues  Plan Check LFT 03/22/2017   > 50% of this > 25 min visit spent in face to face counseling or coordination of care     Dr. Brand Males, M.D., [facility]C.P Pulmonary and Critical Care Medicine Staff Physician [facility] Pager: [phone], If no answer or between  15:00h - 7:00h: call [phone]  03/22/2017 10:24 AM

## 2017-03-22 NOTE — Assessment & Plan Note (Addendum)
Stable IPF; thanks to [place] as before Check Pre-bd spiro and dlco only. No lung volume or bd response. - do in 4 months from now  Followup ROV with Shawn Cruz after 4 months but aftrer spirometry

## 2017-03-29 NOTE — Progress Notes (Signed)
Spoke with pt and notified of results per Dr. Wert. Pt verbalized understanding and denied any questions. 

## 2017-05-03 ENCOUNTER — Encounter: Payer: Self-pay | Admitting: Internal Medicine

## 2017-06-15 ENCOUNTER — Other Ambulatory Visit: Payer: Self-pay | Admitting: Internal Medicine

## 2017-06-16 DIAGNOSIS — J849 Interstitial pulmonary disease, unspecified: Secondary | ICD-10-CM | POA: Diagnosis not present

## 2017-07-13 ENCOUNTER — Other Ambulatory Visit: Payer: Self-pay | Admitting: Internal Medicine

## 2017-07-17 DIAGNOSIS — J849 Interstitial pulmonary disease, unspecified: Secondary | ICD-10-CM | POA: Diagnosis not present

## 2017-07-23 ENCOUNTER — Encounter: Payer: Self-pay | Admitting: Internal Medicine

## 2017-07-23 ENCOUNTER — Other Ambulatory Visit (INDEPENDENT_AMBULATORY_CARE_PROVIDER_SITE_OTHER): Payer: Medicare Other

## 2017-07-23 ENCOUNTER — Ambulatory Visit (INDEPENDENT_AMBULATORY_CARE_PROVIDER_SITE_OTHER): Payer: Medicare Other | Admitting: Internal Medicine

## 2017-07-23 VITALS — BP 130/80 | Ht 67.5 in | Wt 228.0 lb

## 2017-07-23 DIAGNOSIS — Z5181 Encounter for therapeutic drug level monitoring: Secondary | ICD-10-CM

## 2017-07-23 DIAGNOSIS — J9611 Chronic respiratory failure with hypoxia: Secondary | ICD-10-CM | POA: Diagnosis not present

## 2017-07-23 DIAGNOSIS — J84112 Idiopathic pulmonary fibrosis: Secondary | ICD-10-CM

## 2017-07-23 LAB — PULMONARY FUNCTION TEST
DL/VA % PRED: 68 %
DL/VA: 3.04 ml/min/mmHg/L
DLCO COR: 11.85 ml/min/mmHg
DLCO cor % pred: 40 %
DLCO unc % pred: 41 %
DLCO unc: 12.11 ml/min/mmHg
FEF 25-75 PRE: 2.06 L/s
FEF2575-%Pred-Pre: 102 %
FEV1-%PRED-PRE: 57 %
FEV1-PRE: 1.59 L
FEV1FVC-%Pred-Pre: 118 %
FEV6-%Pred-Pre: 51 %
FEV6-PRE: 1.84 L
FEV6FVC-%PRED-PRE: 107 %
FVC-%PRED-PRE: 47 %
FVC-PRE: 1.84 L
PRE FEV6/FVC RATIO: 100 %
Pre FEV1/FVC ratio: 87 %

## 2017-07-23 LAB — HEPATIC FUNCTION PANEL
ALBUMIN: 4.3 g/dL (ref 3.5–5.2)
ALK PHOS: 70 U/L (ref 39–117)
ALT: 20 U/L (ref 0–53)
AST: 21 U/L (ref 0–37)
Bilirubin, Direct: 0.1 mg/dL (ref 0.0–0.3)
Total Bilirubin: 0.3 mg/dL (ref 0.2–1.2)
Total Protein: 8.3 g/dL (ref 6.0–8.3)

## 2017-07-23 NOTE — Progress Notes (Signed)
PFT done today. 

## 2017-07-23 NOTE — Progress Notes (Signed)
Subjective:     Patient ID: Shawn Cruz, male   DOB: 11-15-1940, 76 y.o.   MRN: [id number]  HPI     5/31/2017Follow Up Appointment: Pt. Presents to the office for follow up. He feels he is more short of breath, especially with exertion. He also states he is fatigued easily.[name] He is taking 200 mg of Ofab daily. He is tolerating the 200 mg dose well. He was not tolerating the 300 mg dose.There has been a decline in PFT's over the last year of about 15%. We will discuss changing medication from Ofab to Esbriate.   Tests: PFT's 04/04/2016:  FVC: 2.18/ 56% Decline over last year which was 2.34/59%   OV 05/23/2016  Chief Complaint  Patient presents with  . Follow-up    pt states his SOB is stable- has been on esbriet X30 days, only side effect noticed is that increased fatigue and L foot swelling.  Pt has noticed swelling X2 wks.      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 Overall he is tolerating it fine. He wears sunscreen when he goes out for a prolonged period of time. No skin issues. He is having some fatigue. There no GI side effects. He is also noticing some worsening ankle edema particularly on the left side associated with foot edema in the last few weeks. He is wondering if it's related to Pirfenidone (Esbriet). He is on Lasix for chronic venous insufficiency and pedal edema at 40 mg twice a day. This no fever or chills or nausea vomiting. The fatigue is slightly worse than baseline. It is moderate in severity. He has never wanted her to pulmonary rehabilitation due to cost. In terms of dyspnea this is stable   OV 06/27/2016  Chief Complaint  Patient presents with  . Idiopathic Pulmonary Fibrosis    Breathing is worse since last OV. Reports increased SOB, dry cough. Denies chest tightness, wheezing.    FU IPF with chronic hypoxemic resp fialure pn esbeit since June  2017  Tolerating esbriet fine. No skin issues. Having GERD per wife though he denied it. Relieved by TUMS. Not taking PPI; wife feels he needs to be on PPI. Overall feels dyspnea is worse but denies chest pain or edema. Stil uses 3L [place] and does not desaturate below 89% on self monitoring.      OV 10/01/2016   Chief Complaint  Patient presents with  . Follow-up    for PFT results. Pt. states he is breathing about the same. Wife states the opposite. Breathing is worse.    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 Fine. No skin issues. However wife begins to think that he is beginning to have low appetite and some weight loss. However he says because of his obesity visit this is a good thing for him. He tells me that he is stable. This 3 L of oxygen at rest he is able to do his ADLs and only very rarely desaturated to 86%. He says this is stable compared to his last visit in Aug  ust 2017. Off note he is yet to get his Pirfenidone (Esbriet) at one pill 3 times a day which is the 3 pills rolled and 1 pill. . Wife thinks he is beginning to decline because at night she feels that he is a bit more tachypneic. Pulmonary function test done  this visit show stability compared to May 2017. It appears that he is stable and starting Pirfenidone (Esbriet). His wife is wondering about transplant eligibility      OV 03/22/2017  Chief Complaint  Patient presents with  . 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 There are no skin issues. He is tolerating Pirfenidone (Esbriet) just fine except for mild fatigue and some taste issues. But he definitely feels that the Pirfenidone (Esbriet) is better tolerated than the Ofev. He also feels  that the aspirate has halted progression of disease. Otherwise no new issues. Spirometry and DLCO shows relative stability in the last 1 year.   OV 07/23/2017  Chief Complaint  Patient presents with  . Follow-up    PFT today. Pt states that he has had some issues with his SOB due to the climate, occ. cough. Denies any CP. DME: [facility], 61L O17.   76 year old male with idiopathic pulmonary fibrosis and chronic hypoxemic respiratory failure. Presents with left [place]. He tells me that over time in the last 1 year he feels more short of breath but still has a good functional quality of life. He is stable on Pirfenidone (Esbriet). Able to tolerated well. Is due for liver function test today. Interstitial lung disease symptom score shows significant symptom burden. Also lung function shows a 10% decline in FVC and DLCO in the last 1 year. At this point in time he wants a lighter portable oxygen system. Also of note he asked if I could refill his pain medications because his neurosurgeon Dr. Glenna Fellows retiring. I defer this to primary care physician or pain clinic or to the neurosurgery practice   K-BILD ILD QUESTIONNAIRE, Symptom score over prior 2 weeks  7-none, 6-rarely, 5-occ, 5-some times, 3-sev times, 2-most times, 1-every time 07/23/2017   Dyspnea for stairs, incline or hill 2  Chest Tightness 6  Worry about seriousness of lung complaint 2  Avoided doing things that make you dyspneic 2  Have you felt loss of control of lung condition (reversed from original) 2  Felt fed up due to lung condition 4  Felt urge to breathe aka air hunger 3  Has lung condition made you feel anxious 4  How often have you experienced wheezing or whistling sound 5  How much of the time have you felt your lung dz is getting worse 4  How much has your lung condition interfered with job or daily task 1  Were you expecting your lung condition to get worse 4  How much has your lung function limited you carrying things like  groceris 1  How much has your lung function made you think of EOL? 4  Total   Are you financially worse off 3  Grand Total       Results for Shawn Cruz, Shawn Cruz (MRN [id number]) as of 07/23/2017 12:23  Ref. Range 10/01/2016 15:00 10/03/2016 15:30 03/22/2017 08:56 03/22/2017 10:40 07/23/2017 10:55  FVC-Pre Latest Units: L 2.09  2.13  1.84  FVC-%Pred-Pre Latest Units: % 54  55  47  Results for Shawn Cruz, Shawn Cruz (MRN [id number]) as of 07/23/2017 12:23  Ref. Range 10/01/2016 15:00 10/03/2016 15:30 03/22/2017 08:56 03/22/2017 10:40 07/23/2017 10:55  DLCO cor Latest Units: ml/min/mmHg 13.09  10.66  11.85  DLCO cor % pred Latest Units: % 45  39  40       has a past medical history of Anxiety; Chronic kidney disease; DDD (degenerative disc disease); Diabetes mellitus; Fibromyalgia; GERD (gastroesophageal reflux disease); Gout; High cholesterol; Hyperglycemia; Hypertension; Shortness of breath dyspnea; and Sleep apnea.   reports that he quit smoking about 11 years ago. His smoking use included Cigarettes. He has a 50.00 pack-year smoking history. He has quit using smokeless tobacco. His smokeless tobacco use included Chew.  Past Surgical History:  Procedure Laterality Date  . BACK SURGERY     total of 3 surgeries  . back surrgery     2013  . CARDIAC CATHETERIZATION     01/2015  . EYE SURGERY     bilateral cataracts  . HERNIA REPAIR     upper ventral hernia (had as child)  . LEFT HEART CATHETERIZATION WITH CORONARY ANGIOGRAM N/A 01/11/2015   Procedure: LEFT HEART CATHETERIZATION WITH CORONARY ANGIOGRAM;  Surgeon: Adrian Prows, MD;  Location: [facility] CATH LAB;  Service: Cardiovascular;  Laterality: N/A;  . LITHOTRIPSY    . lumbar discetomy     1977  . LUNG BIOPSY Left 04/13/2015   Procedure: LUNG BIOPSY;  Surgeon: [name], MD;  Location: [facility];  Service: Thoracic;  Laterality: Left;  . removal of kidney stone     thru surgery  . rotator cuff repair     Dr. Durward Fortes  . VIDEO ASSISTED  THORACOSCOPY Left 04/13/2015   Procedure: VIDEO ASSISTED THORACOSCOPY;  Surgeon: [name], MD;  Location: [place] [facility];  Service: Thoracic;  Laterality: Left;  [name] VIDEO BRONCHOSCOPY N/A 04/13/2015   Procedure: VIDEO BRONCHOSCOPY;  Surgeon: [name], MD;  Location: [facility] OR;  Service: Thoracic;  Laterality: N/A;    Allergies  Allergen Reactions  . Sulfonamide Derivatives     REACTION: Age 66, leg swelling    Immunization History  Administered Date(s) Administered  . Influenza Split 07/06/2014  . Influenza Whole 07/26/2011  . Influenza, High Dose Seasonal PF 07/15/2017  . Influenza,inj,Quad PF,6+ Mos 08/04/2013  . Influenza-Unspecified 07/22/2015  . Pneumococcal Conjugate-13 08/05/2014  . Pneumococcal Polysaccharide-23 08/22/2012    Family History  Problem Relation Age of Onset  . Hypertension Mother   . Heart attack Mother        age 5's  . Hypertension Father   . Heart attack Father        age 25's  . Aortic aneurysm Sister      Current Outpatient Prescriptions:  .  allopurinol (ZYLOPRIM) 300 MG tablet, Take 300 mg by mouth daily., Disp: , Rfl:  .  aspirin EC 81 MG tablet, Take 81 mg by mouth daily., Disp: , Rfl:  .  atorvastatin (LIPITOR) 10 MG tablet, Take 10 mg by mouth daily at 6 PM. , Disp: , Rfl:  .  chlorpheniramine (CHLOR-TRIMETON) 4 MG tablet, Take 4 mg by mouth 2 (two) times daily as needed for allergies., Disp: , Rfl:  .  Cholecalciferol (VITAMIN D) 2000 UNITS CAPS, Take 5,000 Units by mouth daily. , Disp: , Rfl:  .  citalopram (CELEXA) 40 MG tablet, Take 1 tablet by mouth daily., Disp: , Rfl: 6 .  colchicine (COLCRYS) 0.6 MG tablet, TAKE 0.6 MG BY MOUTH DAILY AS NEEDED FOR GOUT FLARE UP, Disp: 30 tablet, Rfl: 0 .  cyanocobalamin 100 MCG tablet, Take 100 mcg by mouth daily., Disp: , Rfl:  .  ESBRIET 801 MG TABS, TAKE 1 TABLET BY MOUTH THREE TIMES DAILY WITH FOOD, Disp: 90 tablet, Rfl:  0 .  furosemide (LASIX) 40 MG tablet, TAKE 1 AND 1/2 TABLETS BY MOUTH  EVERY MORNING AND 1 TABLET EVERY EVENING, Disp: 225 tablet, Rfl: 0 .  HYDROcodone-acetaminophen (NORCO) 10-325 MG per tablet, Take 1 tablet by mouth every 6 (six) hours as needed., Disp: , Rfl:  .  Loperamide HCl (IMODIUM PO), Take by mouth., Disp: , Rfl:      Review of Systems     Objective:   Physical Exam  Constitutional: He is oriented to person, place, and time. He appears well-developed and well-nourished. No distress.  obese  HENT:  Head: Normocephalic and atraumatic.  Right Ear: External ear normal.  Left Ear: External ear normal.  Mouth/Throat: Oropharynx is clear and moist. No oropharyngeal exudate.  Eyes: Pupils are equal, round, and reactive to light. Conjunctivae and EOM are normal. Right eye exhibits no discharge. Left eye exhibits no discharge. No scleral icterus.  Neck: Normal range of motion. Neck supple. No JVD present. No tracheal deviation present. No thyromegaly present.  Cardiovascular: Normal rate, regular rhythm and intact distal pulses.  Exam reveals no gallop and no friction rub.   No murmur heard. Pulmonary/Chest: Effort normal. No respiratory distress. He has no wheezes. He has rales. He exhibits no tenderness.  Abdominal: Soft. Bowel sounds are normal. He exhibits no distension and no mass. There is no tenderness. There is no rebound and no guarding.  Musculoskeletal: Normal range of motion. He exhibits no edema or tenderness.  Lymphadenopathy:    He has no cervical adenopathy.  Neurological: He is alert and oriented to person, place, and time. He has normal reflexes. No cranial nerve deficit. Coordination normal.  Skin: Skin is warm and dry. No rash noted. He is not diaphoretic. No erythema. No pallor.  Psychiatric: He has a normal mood and affect. His behavior is normal. Judgment and thought content normal.  Nursing note and vitals reviewed.  Vitals:   07/23/17 1215  BP: 130/80  SpO2: 94%  Weight: 228 lb (103.4 kg)  Height: 5' 7.5" (1.715 m)     Estimated body mass index is 35.18 kg/m as calculated from the following:   Height as of this encounter: 5' 7.5" (1.715 m).   Weight as of this encounter: 228 lb (103.4 kg).     Assessment:       ICD-10-CM   1. IPF (idiopathic pulmonary fibrosis) ([place]) J84.112   2. Chronic hypoxemic respiratory failure (HCC) J96.11   3. Encounter for therapeutic drug monitoring Z51.81        Plan:      10% decline in lung function past 1 year  Plan  - continue o2 3L [place] and increase as needed for exertion - meet [facility] to get lighter portable 02 system  - check LFT 07/23/2017 - glad you had flu shot - Please talk to PCP System, Pcp Not In -  and ensure you get  shingarix vaccine - continue esbriet as before - please attend 10/19/17 [facility] regional meet at [place]; I will be a speaker  - will consider you for research trial  Followup 4 months do Pre-bd spiro and dlco only. No lung volume or bd response. No post-bd spiro Return to see Brihanna Devenport in 4 months or sooner if needed    > 50% of this > 25 min visit spent in face to face counseling or coordination of care    Dr. Brand Males, M.D., [facility].C.P Pulmonary and Critical Care Medicine Staff Physician [place] Pulmonary and Critical  Care Pager: [phone], If no answer or between  15:00h - 7:00h: call [phone]  07/23/2017 12:37 PM

## 2017-07-23 NOTE — Addendum Note (Signed)
Addended by: Lorretta Harp on: 07/23/2017 12:46 PM   Modules accepted: Orders

## 2017-07-23 NOTE — Patient Instructions (Addendum)
    ICD-10-CM   1. IPF (idiopathic pulmonary fibrosis) ([place]) J84.112   2. Chronic hypoxemic respiratory failure (HCC) J96.11   3. Encounter for therapeutic drug monitoring Z51.81    10% decline in lung function past 1 year  Plan  - continue o2 3L [place] and increase as needed for exertion - meet [facility] to get lighter portable 02 system  - check LFT 07/23/2017 - glad you had flu shot - Please talk to PCP System, Pcp Not In -  and ensure you get  shingarix vaccine - continue esbriet as before - please attend 10/19/17 [facility] regional meet at [place]; I will be a speaker  - will consider you for research trial  Followup 4 months do Pre-bd spiro and dlco only. No lung volume or bd response. No post-bd spiro Return to see Nyaire Denbleyker in 4 months or sooner if needed

## 2017-07-29 ENCOUNTER — Telehealth: Payer: Self-pay | Admitting: Internal Medicine

## 2017-07-29 NOTE — Telephone Encounter (Signed)
I called [facility] & spoke to [facility] about this order.  She is going to check on it & call me back.

## 2017-07-30 NOTE — Telephone Encounter (Signed)
Melissa from [facility] called me back.  She states pt was called yesterday & he declined the poc eval due to he can't tolerate a pulse dose.  He is currently on 3 liters continuous and the Simply Go can't go to 3 liters continuous.  Nothing further needed.

## 2017-08-11 ENCOUNTER — Other Ambulatory Visit: Payer: Self-pay | Admitting: Internal Medicine

## 2017-08-12 DIAGNOSIS — D692 Other nonthrombocytopenic purpura: Secondary | ICD-10-CM | POA: Diagnosis not present

## 2017-08-12 DIAGNOSIS — L738 Other specified follicular disorders: Secondary | ICD-10-CM | POA: Diagnosis not present

## 2017-08-12 DIAGNOSIS — L570 Actinic keratosis: Secondary | ICD-10-CM | POA: Diagnosis not present

## 2017-08-12 DIAGNOSIS — L821 Other seborrheic keratosis: Secondary | ICD-10-CM | POA: Diagnosis not present

## 2017-08-12 DIAGNOSIS — L918 Other hypertrophic disorders of the skin: Secondary | ICD-10-CM | POA: Diagnosis not present

## 2017-08-16 DIAGNOSIS — J849 Interstitial pulmonary disease, unspecified: Secondary | ICD-10-CM | POA: Diagnosis not present

## 2017-08-20 DIAGNOSIS — M47816 Spondylosis without myelopathy or radiculopathy, lumbar region: Secondary | ICD-10-CM | POA: Diagnosis not present

## 2017-09-16 DIAGNOSIS — J849 Interstitial pulmonary disease, unspecified: Secondary | ICD-10-CM | POA: Diagnosis not present

## 2017-10-07 ENCOUNTER — Other Ambulatory Visit: Payer: Self-pay | Admitting: Internal Medicine

## 2017-10-16 DIAGNOSIS — J849 Interstitial pulmonary disease, unspecified: Secondary | ICD-10-CM | POA: Diagnosis not present

## 2017-11-06 ENCOUNTER — Telehealth: Payer: Self-pay | Admitting: Internal Medicine

## 2017-11-06 NOTE — Telephone Encounter (Signed)
PA request for Esbriet received from [place].   Initiated via [email or web address].  Key: [id number] Request has been sent to plan; a determination is expected within 72 hours.    Will route to EP for follow-up.

## 2017-11-06 NOTE — Telephone Encounter (Signed)
Received fax from [place] on [place] for [place]. Medication was approved and is approved through 11/04/2018  Reference number [id number]

## 2017-11-16 DIAGNOSIS — J849 Interstitial pulmonary disease, unspecified: Secondary | ICD-10-CM | POA: Diagnosis not present

## 2017-11-19 ENCOUNTER — Telehealth: Payer: Self-pay | Admitting: Internal Medicine

## 2017-11-19 DIAGNOSIS — J84112 Idiopathic pulmonary fibrosis: Secondary | ICD-10-CM

## 2017-11-19 MED ORDER — ONDANSETRON HCL 4 MG PO TABS: ORAL_TABLET | ORAL | 0 refills | Status: AC

## 2017-11-19 NOTE — Telephone Encounter (Signed)
Spoke with pt's wife. States that Jacklynn Bue is making him nauseous. Pt has not vomited. Reports that this has been going on for a few days. Pt is taking 1 tablet TID. Pt and his wife would like to have something sent in to help with his nausea.

## 2017-11-19 NOTE — Telephone Encounter (Signed)
Nausea with esbriet  Plan - check cbc, bmet, lft - do ginger capsule ([facility] etc) 1 - 2 day with meals - ensure 4h-5h spacing between each esbriet - zofran 4mg  prn for [place] -> 1-2 tab prn daily;  send 30 tab - no refill   Dr. Brand Males, M.D., [facility].C.P Pulmonary and Critical Care Medicine Staff Physician, [place] Director - Interstitial Lung Disease  Program  Pulmonary [place] at [place], [place], [address]  Pager: [phone], If no answer or between  15:00h - 7:00h: call [phone] Telephone: [phone]

## 2017-11-19 NOTE — Telephone Encounter (Signed)
lmomtcb x1 

## 2017-11-19 NOTE — Telephone Encounter (Signed)
Called and spoke with pt letting him know we needed him to have some labwork, to try ginger cap otc, ensure the time in between each esbriet, and also that we could send in a Rx of zofran to his preferred pharmacy.  Pt expressed understanding. Lab orders placed and Rx sent to pt's pharmacy. Nothing further needed.

## 2017-11-19 NOTE — Telephone Encounter (Signed)
Pt wife returning call. Cb is [phone].

## 2017-11-20 ENCOUNTER — Other Ambulatory Visit (INDEPENDENT_AMBULATORY_CARE_PROVIDER_SITE_OTHER): Payer: Medicare Other

## 2017-11-20 DIAGNOSIS — J84112 Idiopathic pulmonary fibrosis: Secondary | ICD-10-CM | POA: Diagnosis not present

## 2017-11-20 LAB — CBC WITH DIFFERENTIAL/PLATELET
BASOS ABS: 0.1 10*3/uL (ref 0.0–0.1)
BASOS PCT: 1 % (ref 0.0–3.0)
EOS ABS: 0.6 10*3/uL (ref 0.0–0.7)
Eosinophils Relative: 6.6 % — ABNORMAL HIGH (ref 0.0–5.0)
HCT: 42.4 % (ref 39.0–52.0)
Hemoglobin: 14.1 g/dL (ref 13.0–17.0)
LYMPHS ABS: 2 10*3/uL (ref 0.7–4.0)
Lymphocytes Relative: 22.9 % (ref 12.0–46.0)
MCHC: 33.3 g/dL (ref 30.0–36.0)
MCV: 96.2 fl (ref 78.0–100.0)
MONO ABS: 0.9 10*3/uL (ref 0.1–1.0)
Monocytes Relative: 10.6 % (ref 3.0–12.0)
NEUTROS PCT: 58.9 % (ref 43.0–77.0)
Neutro Abs: 5.1 10*3/uL (ref 1.4–7.7)
PLATELETS: 273 10*3/uL (ref 150.0–400.0)
RBC: 4.41 Mil/uL (ref 4.22–5.81)
RDW: 14.5 % (ref 11.5–15.5)
WBC: 8.6 10*3/uL (ref 4.0–10.5)

## 2017-11-20 LAB — BASIC METABOLIC PANEL
BUN: 33 mg/dL — ABNORMAL HIGH (ref 6–23)
CHLORIDE: 94 meq/L — AB (ref 96–112)
CO2: 32 meq/L (ref 19–32)
CREATININE: 1.49 mg/dL (ref 0.40–1.50)
Calcium: 9.8 mg/dL (ref 8.4–10.5)
GFR: 48.86 mL/min — ABNORMAL LOW (ref >60.00)
Glucose, Bld: 125 mg/dL — ABNORMAL HIGH (ref 70–99)
POTASSIUM: 3.3 meq/L — AB (ref 3.5–5.1)
SODIUM: 138 meq/L (ref 135–145)

## 2017-11-20 LAB — HEPATIC FUNCTION PANEL
ALK PHOS: 67 U/L (ref 39–117)
ALT: 16 U/L (ref 0–53)
AST: 19 U/L (ref 0–37)
Albumin: 4.1 g/dL (ref 3.5–5.2)
BILIRUBIN TOTAL: 0.3 mg/dL (ref 0.2–1.2)
Bilirubin, Direct: 0.1 mg/dL (ref 0.0–0.3)
TOTAL PROTEIN: 7.9 g/dL (ref 6.0–8.3)

## 2017-11-22 ENCOUNTER — Telehealth: Payer: Self-pay | Admitting: Internal Medicine

## 2017-11-22 ENCOUNTER — Other Ambulatory Visit: Payer: Self-pay | Admitting: Internal Medicine

## 2017-11-22 MED ORDER — POTASSIUM CHLORIDE 20 MEQ PO PACK: 20.0000 meq | PACK | Freq: Every day | ORAL | 0 refills | Status: DC

## 2017-11-22 MED ORDER — POTASSIUM CHLORIDE ER 10 MEQ PO TBCR: 20.0000 meq | EXTENDED_RELEASE_TABLET | Freq: Every day | ORAL | 0 refills | Status: DC

## 2017-11-22 NOTE — Telephone Encounter (Signed)
Let Lenon Oms know that labs ok except  A. Potassium - low: start kcl 27meq per day for a week  B. LFT normal  C. Chemistry - kidneys are getting drier. Dont know why. Should talk to pcp System, Pcp Not In . If bun worsening then we would at some point need to reduce esbriet to 2 pills three times daily which can help with nausea too   Dr. Brand Males, M.D., [facility].C.P Pulmonary and Critical Care Medicine Staff Physician, [place] Director - Interstitial Lung Disease  Program  Pulmonary [place] at [place], [place], [address]  Pager: [phone], If no answer or between  15:00h - 7:00h: call [phone] Telephone: [phone]     PULMONARY No results for input(s): PHART, PCO2ART, PO2ART, HCO3, TCO2, O2SAT in the last 168 hours.  Invalid input(s): PCO2, PO2  CBC Recent Labs  Lab 11/20/17 1428  HGB 14.1  HCT 42.4  WBC 8.6  PLT 273.0    COAGULATION No results for input(s): INR in the last 168 hours.  CARDIAC  No results for input(s): TROPONINI in the last 168 hours. No results for input(s): PROBNP in the last 168 hours.   CHEMISTRY Recent Labs  Lab 11/20/17 1428  NA 138  K 3.3*  CL 94*  CO2 32  GLUCOSE 125*  BUN 33*  CREATININE 1.49  CALCIUM 9.8   Estimated Creatinine Clearance: 49.5 mL/min (by C-G formula based on SCr of 1.49 mg/dL).   LIVER Recent Labs  Lab 11/20/17 1428  AST 19  ALT 16  ALKPHOS 67  BILITOT 0.3  PROT 7.9  ALBUMIN 4.1     INFECTIOUS No results for input(s): LATICACIDVEN, PROCALCITON in the last 168 hours.   ENDOCRINE CBG (last 3)  No results for input(s): GLUCAP in the last 72 hours.       IMAGING x48h  - image(s) personally visualized  -   highlighted in bold No results found.

## 2017-11-22 NOTE — Telephone Encounter (Signed)
Patient aware of results and stated understanding.  RX for KCL has been phoned in to preferred pharmacy.  Nothing further needed.

## 2017-11-26 DIAGNOSIS — M47816 Spondylosis without myelopathy or radiculopathy, lumbar region: Secondary | ICD-10-CM | POA: Diagnosis not present

## 2017-12-04 DIAGNOSIS — L0231 Cutaneous abscess of buttock: Secondary | ICD-10-CM | POA: Diagnosis not present

## 2017-12-11 DIAGNOSIS — Z09 Encounter for follow-up examination after completed treatment for conditions other than malignant neoplasm: Secondary | ICD-10-CM | POA: Diagnosis not present

## 2017-12-17 DIAGNOSIS — J849 Interstitial pulmonary disease, unspecified: Secondary | ICD-10-CM | POA: Diagnosis not present

## 2018-01-08 DIAGNOSIS — I129 Hypertensive chronic kidney disease with stage 1 through stage 4 chronic kidney disease, or unspecified chronic kidney disease: Secondary | ICD-10-CM | POA: Diagnosis not present

## 2018-01-08 DIAGNOSIS — Z0000 Encounter for general adult medical examination without abnormal findings: Secondary | ICD-10-CM | POA: Diagnosis not present

## 2018-01-08 DIAGNOSIS — N182 Chronic kidney disease, stage 2 (mild): Secondary | ICD-10-CM | POA: Diagnosis not present

## 2018-01-08 DIAGNOSIS — J9611 Chronic respiratory failure with hypoxia: Secondary | ICD-10-CM | POA: Diagnosis not present

## 2018-01-08 DIAGNOSIS — E1169 Type 2 diabetes mellitus with other specified complication: Secondary | ICD-10-CM | POA: Diagnosis not present

## 2018-01-08 DIAGNOSIS — E7800 Pure hypercholesterolemia, unspecified: Secondary | ICD-10-CM | POA: Diagnosis not present

## 2018-01-14 DIAGNOSIS — J849 Interstitial pulmonary disease, unspecified: Secondary | ICD-10-CM | POA: Diagnosis not present

## 2018-01-16 ENCOUNTER — Telehealth: Payer: Self-pay | Admitting: Internal Medicine

## 2018-01-16 DIAGNOSIS — M7989 Other specified soft tissue disorders: Secondary | ICD-10-CM

## 2018-01-16 NOTE — Telephone Encounter (Signed)
REd legs can be cellulitis, DVT but more lilely cellulitis given "crust". Needs to sleep with leg elevated. Needs face to face eval by APP 01/17/18  . Can he do [place] LE doppler 01/17/18. Stocking recommendation after above. This is what is ideal  Dr. Brand Males, M.D., [facility].C.P Pulmonary and Critical Care Medicine Staff Physician, [place] Director - Interstitial Lung Disease  Program  Pulmonary [place] at [place], [place], [address]  Pager: [phone], If no answer or between  15:00h - 7:00h: call [phone] Telephone: [phone]

## 2018-01-16 NOTE — Telephone Encounter (Signed)
Spoke with pt's wife. She is aware of MR's recommendations. OV has been scheduled for 01/17/18 at 2:30pm. Order has been placed STAT for the venous doppler. Nothing further was needed.

## 2018-01-16 NOTE — Telephone Encounter (Signed)
Spoke with patient's wife. She stated that the patient's legs have been swollen and red for the past few days. Also, on both legs there appears to "crusty" areas like a sore has opened and drained. She did not see any drainage, just the "crust".   Denied any increased in SOB. Patient does have IPF. He is currently taking Lasix.   The wife wants to get compression socks to see if this will help but wanted to ask MR for his recommendations first.   Pharmacy is [facility] in [place].   MR, please advise. Thanks!

## 2018-01-17 ENCOUNTER — Ambulatory Visit (HOSPITAL_COMMUNITY)
Admission: RE | Admit: 2018-01-17 | Discharge: 2018-01-17 | Disposition: A | Discharge status: Home / Self Care | Payer: Medicare Other | Source: Ambulatory Visit | Attending: Internal Medicine | Admitting: Internal Medicine

## 2018-01-17 ENCOUNTER — Ambulatory Visit: Payer: Medicare Other | Admitting: Adult Health

## 2018-01-17 ENCOUNTER — Encounter: Payer: Self-pay | Admitting: Adult Health

## 2018-01-17 DIAGNOSIS — M7989 Other specified soft tissue disorders: Secondary | ICD-10-CM | POA: Diagnosis not present

## 2018-01-17 DIAGNOSIS — L03119 Cellulitis of unspecified part of limb: Secondary | ICD-10-CM | POA: Diagnosis not present

## 2018-01-17 DIAGNOSIS — J84112 Idiopathic pulmonary fibrosis: Secondary | ICD-10-CM

## 2018-01-17 DIAGNOSIS — L0390 Cellulitis, unspecified: Secondary | ICD-10-CM | POA: Insufficient documentation

## 2018-01-17 MED ORDER — DOXYCYCLINE HYCLATE 100 MG PO TABS: 100.0000 mg | ORAL_TABLET | Freq: Two times a day (BID) | ORAL | 0 refills | Status: DC

## 2018-01-17 NOTE — Assessment & Plan Note (Addendum)
  Possible Mild Lower extremity cellulitis with venous insufficiency and stasis dermatitis changes.  Will give doxycycline times 1 week.  Patient to Watch for worsening signs of cellulitis.  Follow-up with PCP in 2 weeks. Skin care education given Prelim ven dopp neg for DVt   Plan  Patient Instructions  Doxcycline 100mg  Twice daily  For 1 week  Wash legs gently with soap and water and rinse /pat dry .  Keep legs elevated.  Low salt diet  Continue on lasix  Follow up with Dr. Chase Caller in 6 weeks with Pre-bd spiro and dlco only. No lung volume or bd response. No post-bd spiro  Follow up with PCP in 2 weeks to make sure this is improving , report increased redness or fever.  Please contact office for sooner follow up if symptoms do not improve or worsen or seek emergency care

## 2018-01-17 NOTE — Patient Instructions (Addendum)
Doxcycline 100mg  Twice daily  For 1 week  Wash legs gently with soap and water and rinse /pat dry .  Keep legs elevated.  Low salt diet  Continue on lasix  Follow up with Dr. Chase Caller in 6 weeks with Pre-bd spiro and dlco only. No lung volume or bd response. No post-bd spiro  Follow up with PCP in 2 weeks to make sure this is improving , report increased redness or fever.  Please contact office for sooner follow up if symptoms do not improve or worsen or seek emergency care

## 2018-01-17 NOTE — Progress Notes (Signed)
@Patient  ID: Shawn Cruz, male    DOB: [birth year], 77 y.o.   MRN: [id number]  Chief Complaint  Patient presents with  . Follow-up    IPF    Referring provider: No ref. 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 . Complains of Lower legs have been more swollen and red for last couple of weeks. Has chronic leg swelling , Venous Insufficiency /Varicose veins , on lasix .  Had venous dopplers today , told prelim was neg for DVT .  No fever .     Patient has underlying IPF on Esbriet. Patient remains on oxygen, no increased O2 demands. Activity tolerance is the same.  No increased cough or dyspnea.     Allergies  Allergen Reactions  . Sulfonamide Derivatives     REACTION: Age 34, leg swelling    Immunization History  Administered Date(s) Administered  . Influenza Split 07/06/2014  . Influenza Whole 07/26/2011  . Influenza, High Dose Seasonal PF 07/15/2017  . Influenza,inj,Quad PF,6+ Mos 08/04/2013  . Influenza-Unspecified 07/22/2015  . Pneumococcal Conjugate-13 08/05/2014  . Pneumococcal Polysaccharide-23 08/22/2012    Past Medical History:  Diagnosis Date  . Anxiety   . Chronic kidney disease    has kidney stones  "galore"  Last attack was 8-10 yrs ago  . DDD (degenerative disc disease)    LS spine  . Diabetes mellitus    diet controlled  takes no meds  . Fibromyalgia   . GERD (gastroesophageal reflux disease)    takes tums occasionally for indigestion  . Gout   . High cholesterol   . Hyperglycemia   . Hypertension    history of....not taking anything now  . Shortness of breath dyspnea   . Sleep apnea    tested "yrs ago" ...unable to tolerate mask....PCP Toy Cookey is aware    Tobacco History: Social History   Tobacco Use  Smoking Status Former Smoker  . Packs/day: 1.00  . Years: 50.00  . Pack years: 50.00  .  Types: Cigarettes  . Last attempt to quit: 11/05/2005  . Years since quitting: 12.2  Smokeless Tobacco Former Systems developer  . Types: Chew  Tobacco Comment   Quit at age 56   Counseling given: Not Answered Comment: Quit at age 54   Outpatient Encounter Medications as of 01/17/2018  Medication Sig  . allopurinol (ZYLOPRIM) 300 MG tablet Take 300 mg by mouth daily.  [name] aspirin EC 81 MG tablet Take 81 mg by mouth daily.  [name] atorvastatin (LIPITOR) 10 MG tablet Take 10 mg by mouth daily at 6 PM.   . chlorpheniramine (CHLOR-TRIMETON) 4 MG tablet Take 4 mg by mouth 2 (two) times daily as needed for allergies.  . Cholecalciferol (VITAMIN D) 2000 UNITS CAPS Take 5,000 Units by mouth daily.   . citalopram (CELEXA) 40 MG tablet Take 1 tablet by mouth daily.  . colchicine (COLCRYS) 0.6 MG tablet TAKE 0.6 MG BY MOUTH DAILY AS NEEDED FOR GOUT FLARE UP  . cyanocobalamin 100 MCG tablet Take 100 mcg by mouth daily.  . ESBRIET 801 MG TABS TAKE 1 TABLET BY MOUTH THREE TIMES DAILY WITH FOOD  . furosemide (LASIX) 40 MG tablet TAKE 1 AND 1/2 TABLETS BY MOUTH EVERY MORNING AND 1 TABLET EVERY EVENING  . HYDROcodone-acetaminophen (NORCO) 10-325 MG per tablet Take 1 tablet by mouth  every 6 (six) hours as needed.  . Loperamide HCl (IMODIUM PO) Take by mouth.  . ondansetron (ZOFRAN) 4 MG tablet Take 1-2 tablets prn daily  . potassium chloride (KLOR-CON) 20 MEQ packet Take 20 mEq by mouth daily.  [name] doxycycline (VIBRA-TABS) 100 MG tablet Take 1 tablet (100 mg total) by mouth 2 (two) times daily.   No facility-administered encounter medications on file as of 01/17/2018.      Review of Systems  Constitutional:   No  weight loss, night sweats,  Fevers, chills, + fatigue, or  lassitude.  HEENT:   No headaches,  Difficulty swallowing,  Tooth/dental problems, or  Sore throat,                No sneezing, itching, ear ache, nasal congestion, post nasal drip,   CV:  No chest pain,  Orthopnea, PND, anasarca, dizziness,  palpitations, syncope.   GI  No heartburn, indigestion, abdominal pain, nausea, vomiting, diarrhea, change in bowel habits, loss of appetite, bloody stools.   Resp:   No chest wall deformity  Skin: no rash or lesions.  GU: no dysuria, change in color of urine, no urgency or frequency.  No flank pain, no hematuria   MS:  No joint pain or swelling.  No decreased range of motion.  No back pain.    Physical Exam  BP 126/72 (BP Location: Left Arm, Cuff Size: Normal)   Pulse 93   Ht 5\' 7"  (1.702 m)   Wt 222 lb 6.4 oz (100.9 kg)   SpO2 93%   BMI 34.83 kg/m   GEN: A/Ox3; pleasant , NAD, elderly on oxygen   HEENT:  [place]/AT,  EACs-clear, TMs-wnl, NOSE-clear, THROAT-clear, no lesions, no postnasal drip or exudate noted.   NECK:  Supple w/ fair ROM; no JVD; normal carotid impulses w/o bruits; no thyromegaly or nodules palpated; no lymphadenopathy.    RESP bibasilar crackles no accessory muscle use, no dullness to percussion  CARD:  RRR, no m/r/g, trace peripheral edema, pulses intact,  Brawny purplish discoloration towards the toes, warm to touch pulses intact, mild redness bilaterally along the lower extremities with stasis dermatitis changes  GI:   Soft & nt; nml bowel sounds; no organomegaly or masses detected.   Musco: Warm bil, no deformities or joint swelling noted.   Neuro: alert, no focal deficits noted.    Skin: Warm, no lesions or rashes    Lab Results:  CBC    Component Value Date/Time   WBC 8.6 11/20/2017 1428   RBC 4.41 11/20/2017 1428   HGB 14.1 11/20/2017 1428   HCT 42.4 11/20/2017 1428   PLT 273.0 11/20/2017 1428   MCV 96.2 11/20/2017 1428   MCH 29.6 04/15/2015 0435   MCHC 33.3 11/20/2017 1428   RDW 14.5 11/20/2017 1428   LYMPHSABS 2.0 11/20/2017 1428   MONOABS 0.9 11/20/2017 1428   EOSABS 0.6 11/20/2017 1428   BASOSABS 0.1 11/20/2017 1428    BMET    Component Value Date/Time   NA 138 11/20/2017 1428   K 3.3 (L) 11/20/2017 1428   CL 94 (L)  11/20/2017 1428   CO2 32 11/20/2017 1428   GLUCOSE 125 (H) 11/20/2017 1428   BUN 33 (H) 11/20/2017 1428   CREATININE 1.49 11/20/2017 1428   CALCIUM 9.8 11/20/2017 1428   GFRNONAA 40 (L) 04/15/2015 0435   GFRAA 46 (L) 04/15/2015 0435    BNP No results found for: BNP  ProBNP No results found for: PROBNP  Imaging: No results  found.   Assessment & Plan:   IPF (idiopathic pulmonary fibrosis) Appears stable Continue current regimen Recheck pre-spirometry with DLCO on return  Cellulitis  Possible Mild Lower extremity cellulitis with venous insufficiency and stasis dermatitis changes.  Will give doxycycline times 1 week.  Patient to Watch for worsening signs of cellulitis.  Follow-up with PCP in 2 weeks. Skin care education given Prelim ven dopp neg for DVt   Plan  Patient Instructions  Doxcycline 100mg  Twice daily  For 1 week  Wash legs gently with soap and water and rinse /pat dry .  Keep legs elevated.  Low salt diet  Continue on lasix  Follow up with Dr. Chase Caller in 6 weeks with Pre-bd spiro and dlco only. No lung volume or bd response. No post-bd spiro  Follow up with PCP in 2 weeks to make sure this is improving , report increased redness or fever.  Please contact office for sooner follow up if symptoms do not improve or worsen or seek emergency care            Rexene Edison, NP 01/17/2018

## 2018-01-17 NOTE — Assessment & Plan Note (Signed)
Appears stable Continue current regimen Recheck pre-spirometry with DLCO on return

## 2018-02-11 DIAGNOSIS — M47816 Spondylosis without myelopathy or radiculopathy, lumbar region: Secondary | ICD-10-CM | POA: Diagnosis not present

## 2018-02-14 DIAGNOSIS — J849 Interstitial pulmonary disease, unspecified: Secondary | ICD-10-CM | POA: Diagnosis not present

## 2018-03-04 ENCOUNTER — Other Ambulatory Visit (INDEPENDENT_AMBULATORY_CARE_PROVIDER_SITE_OTHER): Payer: Medicare Other

## 2018-03-04 ENCOUNTER — Encounter: Payer: Self-pay | Admitting: Internal Medicine

## 2018-03-04 ENCOUNTER — Ambulatory Visit: Payer: Medicare Other | Admitting: Internal Medicine

## 2018-03-04 ENCOUNTER — Ambulatory Visit (INDEPENDENT_AMBULATORY_CARE_PROVIDER_SITE_OTHER): Payer: Medicare Other | Admitting: Internal Medicine

## 2018-03-04 VITALS — BP 118/72 | HR 96 | Ht 67.5 in | Wt 218.2 lb

## 2018-03-04 DIAGNOSIS — J84112 Idiopathic pulmonary fibrosis: Secondary | ICD-10-CM

## 2018-03-04 DIAGNOSIS — J9611 Chronic respiratory failure with hypoxia: Secondary | ICD-10-CM

## 2018-03-04 DIAGNOSIS — Z5181 Encounter for therapeutic drug level monitoring: Secondary | ICD-10-CM

## 2018-03-04 LAB — PULMONARY FUNCTION TEST
DL/VA % pred: 73 %
DL/VA: 3.26 ml/min/mmHg/L
DLCO UNC % PRED: 42 %
DLCO UNC: 12.45 ml/min/mmHg
FEF 25-75 PRE: 1.98 L/s
FEF2575-%PRED-PRE: 99 %
FEV1-%Pred-Pre: 56 %
FEV1-Pre: 1.55 L
FEV1FVC-%PRED-PRE: 115 %
FEV6-%Pred-Pre: 51 %
FEV6-Pre: 1.82 L
FEV6FVC-%Pred-Pre: 107 %
FVC-%Pred-Pre: 48 %
FVC-Pre: 1.84 L
PRE FEV1/FVC RATIO: 84 %
Pre FEV6/FVC Ratio: 100 %

## 2018-03-04 LAB — HEPATIC FUNCTION PANEL
ALBUMIN: 4.2 g/dL (ref 3.5–5.2)
ALK PHOS: 62 U/L (ref 39–117)
ALT: 18 U/L (ref 0–53)
AST: 21 U/L (ref 0–37)
Bilirubin, Direct: 0.1 mg/dL (ref 0.0–0.3)
TOTAL PROTEIN: 8 g/dL (ref 6.0–8.3)
Total Bilirubin: 0.3 mg/dL (ref 0.2–1.2)

## 2018-03-04 LAB — CBC WITH DIFFERENTIAL/PLATELET
BASOS PCT: 0.6 % (ref 0.0–3.0)
Basophils Absolute: 0 10*3/uL (ref 0.0–0.1)
EOS ABS: 0.5 10*3/uL (ref 0.0–0.7)
EOS PCT: 6.7 % — AB (ref 0.0–5.0)
HEMATOCRIT: 41.4 % (ref 39.0–52.0)
HEMOGLOBIN: 14 g/dL (ref 13.0–17.0)
LYMPHS PCT: 23.2 % (ref 12.0–46.0)
Lymphs Abs: 1.7 10*3/uL (ref 0.7–4.0)
MCHC: 33.8 g/dL (ref 30.0–36.0)
MCV: 94.8 fl (ref 78.0–100.0)
MONOS PCT: 13.5 % — AB (ref 3.0–12.0)
Monocytes Absolute: 1 10*3/uL (ref 0.1–1.0)
Neutro Abs: 4.1 10*3/uL (ref 1.4–7.7)
Neutrophils Relative %: 56 % (ref 43.0–77.0)
PLATELETS: 230 10*3/uL (ref 150.0–400.0)
RBC: 4.37 Mil/uL (ref 4.22–5.81)
RDW: 13.8 % (ref 11.5–15.5)
WBC: 7.3 10*3/uL (ref 4.0–10.5)

## 2018-03-04 LAB — BASIC METABOLIC PANEL
BUN: 34 mg/dL — ABNORMAL HIGH (ref 6–23)
CHLORIDE: 94 meq/L — AB (ref 96–112)
CO2: 33 meq/L — AB (ref 19–32)
Calcium: 10.2 mg/dL (ref 8.4–10.5)
Creatinine, Ser: 1.71 mg/dL — ABNORMAL HIGH (ref 0.40–1.50)
GFR: 41.65 mL/min — ABNORMAL LOW (ref >60.00)
Glucose, Bld: 74 mg/dL (ref 70–99)
POTASSIUM: 3.7 meq/L (ref 3.5–5.1)
Sodium: 138 mEq/L (ref 135–145)

## 2018-03-04 NOTE — Progress Notes (Signed)
, Subjective:     Patient ID: Shawn Cruz, male   DOB: 17-Feb-1941, 77 y.o.   MRN: [id number]  HPI   5/31/2017Follow Up Appointment: Pt. Presents to the office for follow up. He feels he is more short of breath, especially with exertion. He also states he is fatigued easily.[name] He is taking 200 mg of Ofab daily. He is tolerating the 200 mg dose well. He was not tolerating the 300 mg dose.There has been a decline in PFT's over the last year of about 15%. We will discuss changing medication from Ofev to [place].   Tests: PFT's 04/04/2016:  FVC: 2.18/ 56% Decline over last year which was 2.34/59%   OV 05/23/2016  Chief Complaint  Patient presents with  . Follow-up    pt states his SOB is stable- has been on esbriet X30 days, only side effect noticed is that increased fatigue and L foot swelling.  Pt has noticed swelling X2 wks.      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 Overall he is tolerating it fine. He wears sunscreen when he goes out for a prolonged period of time. No skin issues. He is having some fatigue. There no GI side effects. He is also noticing some worsening ankle edema particularly on the left side associated with foot edema in the last few weeks. He is wondering if it's related to Pirfenidone (Esbriet). He is on Lasix for chronic venous insufficiency and pedal edema at 40 mg twice a day. This no fever or chills or nausea vomiting. The fatigue is slightly worse than baseline. It is moderate in severity. He has never wanted her to pulmonary rehabilitation due to cost. In terms of dyspnea this is stable   OV 06/27/2016  Chief Complaint  Patient presents with  . Idiopathic Pulmonary Fibrosis    Breathing is worse since last OV. Reports increased SOB, dry cough. Denies chest tightness, wheezing.    FU IPF with chronic hypoxemic resp fialure pn esbeit since June  2017  Tolerating esbriet fine. No skin issues. Having GERD per wife though he denied it. Relieved by TUMS. Not taking PPI; wife feels he needs to be on PPI. Overall feels dyspnea is worse but denies chest pain or edema. Stil uses 3L [place] and does not desaturate below 89% on self monitoring.      OV 10/01/2016   Chief Complaint  Patient presents with  . Follow-up    for PFT results. Pt. states he is breathing about the same. Wife states the opposite. Breathing is worse.    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 Fine. No skin issues. However wife begins to think that he is beginning to have low appetite and some weight loss. However he says because of his obesity visit this is a good thing for him. He tells me that he is stable. This 3 L of oxygen at rest he is able to do his ADLs and only very rarely desaturated to 86%. He says this is stable compared to his last visit in Aug  ust 2017. Off note he is yet to get his Pirfenidone (Esbriet) at one pill 3 times a day which is the 3 pills rolled and 1 pill. . Wife thinks he is beginning to decline because at night she feels that he is a bit more tachypneic. Pulmonary function test done this  visit show stability compared to May 2017. It appears that he is stable and starting Pirfenidone (Esbriet). His wife is wondering about transplant eligibility      OV 03/22/2017  Chief Complaint  Patient presents with  . 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 There are no skin issues. He is tolerating Pirfenidone (Esbriet) just fine except for mild fatigue and some taste issues. But he definitely feels that the Pirfenidone (Esbriet) is better tolerated than the Ofev. He also feels  that the aspirate has halted progression of disease. Otherwise no new issues. Spirometry and DLCO shows relative stability in the last 1 year.   OV 07/23/2017  Chief Complaint  Patient presents with  . Follow-up    PFT today. Pt states that he has had some issues with his SOB due to the climate, occ. cough. Denies any CP. DME: [facility], 71L O32.   77 year old male with idiopathic pulmonary fibrosis and chronic hypoxemic respiratory failure. Presents with wife [name]. He tells me that over time in the last 1 year he feels more short of breath but still has a good functional quality of life. He is stable on Pirfenidone (Esbriet). Able to tolerated well. Is due for liver function test today. Interstitial lung disease symptom score shows significant symptom burden. Also lung function shows a 10% decline in FVC and DLCO in the last 1 year. At this point in time he wants a lighter portable oxygen system. Also of note he asked if I could refill his pain medications because his neurosurgeon Dr. Glenna Fellows retiring. I defer this to primary care physician or pain clinic or to the neurosurgery practice   OV 03/04/2018  Chief Complaint  Patient presents with  . Follow-up    PFT done today.  Pt states his breathing has become worse and has a mild cough. Per pt's wife, pt has no energy, little appetite, and is losing strength. Pt is still taking Esbriet and only complaint is it has been messing with his appetite. Denies any CP.    Follow-up idiopathic pulmonary fibrosis . DX April 13, 2015 via biopsy [facility]. STopped ofev spring 2017 due to diarrhea/intolerance. On esbrit since June 2017. Also with chronic hypoxemic respiratory failure on 3 L of oxygen   [name] presents with his wife. He tells me in the last 3 months he feels more short of breath than usual. He thinks he might be desaturating when he walks on 3 L of oxygen to the bathroom and back although this is not properly documented t home. He remains on 3 L.  He  remains on esbriet. Does have a constellation of mild nausea and moderate amount of fatigue and some20 pound weight loss in the last few years. He feels this is because of Pirfenidone (Esbriet). Walking desaturation test on 03/04/2018 185 feet x 3 laps on  3L [place]   - did only  2 laps and slow and stopped due to severe dyspnea. did *NOTdesaturate. Rest pulse ox was 99%, final pulse ox was 93%. HR response 86/min at rest to 104/min at peak exertion. Patient Lenon Oms  Did not Desaturate < 88% . Lenon Oms did yes  Desaturated </= 3% points. Lenon Oms did not get tachyardic  PFT show stability since Sept 2018  LAb  revewi shows mild decrease in GFR jan 2019    K-BILD ILD QUESTIONNAIRE, Symptom score over prior 2 weeks  7-none, 6-rarely, 5-occ, 5-some times, 3-sev times, 2-most times, 1-every time 07/23/2017   Dyspnea for stairs, incline or hill 2  Chest Tightness 6  Worry about seriousness of lung complaint 2  Avoided doing things that make you dyspneic 2  Have you felt loss of control of lung condition (reversed from original) 2  Felt fed up due to lung condition 4  Felt urge to breathe aka air hunger 3  Has lung condition made you feel anxious 4  How often have you experienced wheezing or whistling sound 5  How much of the time have you felt your lung dz is getting worse 4  How much has your lung condition interfered with job or daily task 1  Were you expecting your lung condition to get worse 4  How much has your lung function limited you carrying things like groceris 1  How much has your lung function made you think of EOL? 4  Total   Are you financially worse off 3  Grand Total    Results for CULLEN, LAHAIE (MRN [id number]) as of 03/04/2018 11:39  Ref. Range 10/01/2016 15:00 03/22/2017 08:56 07/23/2017 10:55 03/04/2018 10:52  FVC-Pre Latest Units: L 2.09 2.13 1.84 1.84  FVC-%Pred-Pre Latest Units: % 54 55 47 48   Results for [place], ANTOLIN (MRN [id number]) as  of 03/04/2018 11:39  Ref. Range 10/01/2016 15:00 03/22/2017 08:56 07/23/2017 10:55 03/04/2018 10:52  DLCO unc Latest Units: ml/min/mmHg 13.06 11.00 12.11 12.45  DLCO unc % pred Latest Units: % 45 37 41 42  Results for KHYRIE, MASI (MRN [id number]) as of 03/04/2018 11:39  Ref. Range 11/20/2017 14:28  Creatinine Latest Ref Range: 0.40 - 1.50 mg/dL 1.49  Results for ADDEN, STROUT (MRN [id number]) as of 03/04/2018 11:39  Ref. Range 11/20/2017 14:28  GFR Latest Ref Range: >60.00 mL/min 48.86 (L)      has a past medical history of Anxiety, Chronic kidney disease, DDD (degenerative disc disease), Diabetes mellitus, Fibromyalgia, GERD (gastroesophageal reflux disease), Gout, High cholesterol, Hyperglycemia, Hypertension, Shortness of breath dyspnea, and Sleep apnea.   reports that he quit smoking about 12 years ago. His smoking use included cigarettes. He has a 50.00 pack-year smoking history. He has quit using smokeless tobacco. His smokeless tobacco use included chew.  Past Surgical History:  Procedure Laterality Date  . BACK SURGERY     total of 3 surgeries  . back surrgery     2013  . CARDIAC CATHETERIZATION     01/2015  . EYE SURGERY     bilateral cataracts  . HERNIA REPAIR     upper ventral hernia (had as child)  . LEFT HEART CATHETERIZATION WITH CORONARY ANGIOGRAM N/A 01/11/2015   Procedure: LEFT HEART CATHETERIZATION WITH CORONARY ANGIOGRAM;  Surgeon: Adrian Prows, MD;  Location[facility] CATH LAB;  Service: Cardiovascular;  Laterality: N/A;  . LITHOTRIPSY    . lumbar discetomy     1977  . LUNG BIOPSY Left 04/13/2015   Procedure: LUNG BIOPSY;  Surgeon: [name], MD;  Location: [facility];  Service: Thoracic;  Laterality: Left;  . removal of kidney stone     thru surgery  . rotator cuff repair     Dr. Durward Fortes  . VIDEO ASSISTED THORACOSCOPY Left 04/13/2015   Procedure: VIDEO ASSISTED THORACOSCOPY;  Surgeon: [name], MD;  Location: [facility];  Service: Thoracic;  Laterality: Left;   [name] VIDEO BRONCHOSCOPY N/A 04/13/2015   Procedure: VIDEO BRONCHOSCOPY;  Surgeon: [name], MD;  Location: [facility] OR;  Service: Thoracic;  Laterality: N/A;    Allergies  Allergen Reactions  . Sulfonamide Derivatives     REACTION: Age 31, leg swelling    Immunization History  Administered Date(s) Administered  . Influenza Split 07/06/2014  . Influenza Whole 07/26/2011  . Influenza, High Dose Seasonal PF 07/15/2017  . Influenza,inj,Quad PF,6+ Mos 08/04/2013  . Influenza-Unspecified 07/22/2015  . Pneumococcal Conjugate-13 08/05/2014  . Pneumococcal Polysaccharide-23 08/22/2012    Family History  Problem Relation Age of Onset  . Hypertension Mother   . Heart attack Mother        age 15's  . Hypertension Father   . Heart attack Father        age 1's  . Aortic aneurysm Sister      Current Outpatient Medications:  .  allopurinol (ZYLOPRIM) 300 MG tablet, Take 300 mg by mouth daily., Disp: , Rfl:  .  aspirin EC 81 MG tablet, Take 81 mg by mouth daily., Disp: , Rfl:  .  atorvastatin (LIPITOR) 10 MG tablet, Take 10 mg by mouth daily at 6 PM. , Disp: , Rfl:  .  chlorpheniramine (CHLOR-TRIMETON) 4 MG tablet, Take 4 mg by mouth 2 (two) times daily as needed for allergies., Disp: , Rfl:  .  Cholecalciferol (VITAMIN D) 2000 UNITS CAPS, Take 5,000 Units by mouth daily. , Disp: , Rfl:  .  citalopram (CELEXA) 40 MG tablet, Take 1 tablet by mouth daily., Disp: , Rfl: 6 .  cyanocobalamin 100 MCG tablet, Take 100 mcg by mouth daily., Disp: , Rfl:  .  ESBRIET 801 MG TABS, TAKE 1 TABLET BY MOUTH THREE TIMES DAILY WITH FOOD, Disp: 90 tablet, Rfl: 11 .  furosemide (LASIX) 40 MG tablet, TAKE 1 AND 1/2 TABLETS BY MOUTH EVERY MORNING AND 1 TABLET EVERY EVENING, Disp: 225 tablet, Rfl: 0 .  HYDROcodone-acetaminophen (NORCO) 10-325 MG per tablet, Take 1 tablet by mouth every 6 (six) hours as needed., Disp: , Rfl:  .  ondansetron (ZOFRAN) 4 MG tablet, Take 1-2 tablets prn daily, Disp: 30 tablet, Rfl:  0 .  Loperamide HCl (IMODIUM PO), Take by mouth., Disp: , Rfl:  .  potassium chloride (KLOR-CON) 20 MEQ packet, Take 20 mEq by mouth daily. (Patient not taking: Reported on 03/04/2018), Disp: 7 tablet, Rfl: 0   Review of Systems     Objective:   Physical Exam  Constitutional: He is oriented to person, place, and time. He appears well-developed and well-nourished. No distress.  Obese On o2  HENT:  Head: Normocephalic and atraumatic.  Right Ear: External ear normal.  Left Ear: External ear normal.  Mouth/Throat: Oropharynx is clear and moist. No oropharyngeal exudate.  Eyes: Pupils are equal, round, and reactive to light. Conjunctivae and EOM are normal. Right eye exhibits no discharge. Left eye exhibits no discharge. No scleral icterus.  Neck: Normal range of motion. Neck supple. No JVD present. No tracheal deviation present. No thyromegaly present.  Cardiovascular: Normal rate, regular rhythm and intact distal pulses. Exam reveals no gallop and no friction rub.  No murmur heard. Pulmonary/Chest: Effort normal. No respiratory distress. He has no wheezes. He has rales. He exhibits no tenderness.  Abdominal: Soft. Bowel sounds are normal. He exhibits no distension and no mass. There is no tenderness. There is no rebound and no guarding.  Musculoskeletal: Normal range of motion. He exhibits no  edema or tenderness.  Lymphadenopathy:    He has no cervical adenopathy.  Neurological: He is alert and oriented to person, place, and time. He has normal reflexes. No cranial nerve deficit. Coordination normal.  Skin: Skin is warm and dry. No rash noted. He is not diaphoretic. No erythema. No pallor.  Psychiatric: He has a normal mood and affect. His behavior is normal. Judgment and thought content normal.  Nursing note and vitals reviewed.  Vitals:   03/04/18 1120  BP: 118/72  Pulse: 96  SpO2: 92%  Weight: 218 lb 3.2 oz (99 kg)  Height: 5' 7.5" (1.715 m)    Estimated body mass index is  33.67 kg/m as calculated from the following:   Height as of this encounter: 5' 7.5" (1.715 m).   Weight as of this encounter: 218 lb 3.2 oz (99 kg).     Assessment:       ICD-10-CM   1. IPF (idiopathic pulmonary fibrosis) ([place]) J84.112   2. Chronic hypoxemic respiratory failure (H[facility]) J96.11   3. Encounter for therapeutic drug monitoring Z51.81        Plan:       IPF decline > 10% as of sept 2018 in a year but since sept 2018 is stable through 03/04/2018 Consetllation of fatigue, weight loss, nausea mild is likely due to esbriet  And deconditoning O2 level itself is ok  Plan List ofev as allergy Walk test on 3L [place] and forehead probe Check cbc, bmet, lft 03/04/2018 - based on this esp if kidney is an issue can downadjust esbriet Continue o2 at [facility] I recommend rehab - in past you have refused - please reconsider   Followup 4 months do Pre-bd spiro and dlco only. No lung volume or bd response. No post-bd spiro REtun to ILD clinic in 4 months or sooner   > 50% of this > 25 min visit spent in face to face counseling or coordination of care     Dr. Brand Males, M.D., [facility].C.P Pulmonary and Critical Care Medicine Staff Physician, [place] Director - Interstitial Lung Disease  Program  Pulmonary [place] [facility] [place], [place], [address]  Pager: [phone], If no answer or between  15:00h - 7:00h: call [phone] Telephone: [phone]

## 2018-03-04 NOTE — Progress Notes (Signed)
PFT before and after completed 03/04/18

## 2018-03-04 NOTE — Patient Instructions (Addendum)
ICD-10-CM   1. IPF (idiopathic pulmonary fibrosis) ([place]) J84.112   2. Chronic hypoxemic respiratory failure (HCC) J96.11   3. Encounter for therapeutic drug monitoring Z51.81     IPF decline > 10% as of sept 2018 in a year but since sept 2018 is stable through 03/04/2018 Consetllation of fatigue, weight loss, nausea mild is likely due to [place] List ofev as allergy Check cbc, bmet, lft 03/04/2018 - based on this esp if kidney is an issue can downadjust esbriet Continue o2 I recommend rehab - in past you have refused - please reconsider  Followup 4 months do Pre-bd spiro and dlco only. No lung volume or bd response. No post-bd spiro REtun to ILD clinic in 4 months or sooner

## 2018-03-05 ENCOUNTER — Telehealth: Payer: Self-pay | Admitting: Internal Medicine

## 2018-03-05 NOTE — Telephone Encounter (Signed)
Let Lenon Oms know   A) Kidneys are drier than usual and is possible that to esbriet he eats and drinks water less and then this makes the kidney dry and this in turn makes the esbriet over-act in setting of decreased kidney function. Alternatively worsening creat could be indeendent of esbriet  B) LFT normal  Plan  - reduce esbriet to 2 pills three times daily - drink atleast 3 liters of plain water a day - - call us in 1-2 weeks to see if fatigue etc. Better - repeat bmet in 1-2 weeks and we will call with results   Results for CANDON, CARAS (MRN [id number]) as of 03/05/2018 10:17  Ref. Range 11/20/2017 14:28 01/17/2018 12:00 03/04/2018 10:52 03/04/2018 12:11  Creatinine Latest Ref Range: 0.40 - 1.50 mg/dL 1.49   1.71 (H)  Results for RONON, FERGER (MRN [id number]) as of 03/05/2018 10:17  Ref. Range 11/20/2017 14:28 01/17/2018 12:00 03/04/2018 10:52 03/04/2018 12:11  GFR Latest Ref Range: >60.00 mL/min 48.86 (L)   41.65 (L)    PULMONARY No results for input(s): PHART, PCO2ART, PO2ART, HCO3, TCO2, O2SAT in the last 168 hours.  Invalid input(s): PCO2, PO2  CBC Recent Labs  Lab 03/04/18 1211  HGB 14.0  HCT 41.4  WBC 7.3  PLT 230.0    COAGULATION No results for input(s): INR in the last 168 hours.  CARDIAC  No results for input(s): TROPONINI in the last 168 hours. No results for input(s): PROBNP in the last 168 hours.   CHEMISTRY Recent Labs  Lab 03/04/18 1211  NA 138  K 3.7  CL 94*  CO2 33*  GLUCOSE 74  BUN 34*  CREATININE 1.71*  CALCIUM 10.2   Estimated Creatinine Clearance: 42.2 mL/min (A) (by C-G formula based on SCr of 1.71 mg/dL (H)).   LIVER Recent Labs  Lab 03/04/18 1211  AST 21  ALT 18  ALKPHOS 62  BILITOT 0.3  PROT 8.0  ALBUMIN 4.2     INFECTIOUS No results for input(s): LATICACIDVEN, PROCALCITON in the last 168 hours.   ENDOCRINE CBG (last 3)  No results for input(s): GLUCAP in the last 72 hours.       IMAGING x48h  -  image(s) personally visualized  -   highlighted in bold No results found.

## 2018-03-05 NOTE — Telephone Encounter (Signed)
Called Shawn Cruz, Let him know of these recommendation. But the Patient states he is only taking one pill TID the is reflected on the medication list form last ov. The new recommendation is to reduce and  take 2 pills TID.   Shawn please advise what you would like for  Shawn. Cruz to take thank you.

## 2018-03-06 NOTE — Telephone Encounter (Signed)
Left message for patient to call back for recs.

## 2018-03-06 NOTE — Telephone Encounter (Signed)
Ok so he is on the larger form (not the smaller form). So, in this case take 1 pill twice daily . He should skip the afternoon pill.  Always take with meals though and drink lot of water . However, let him know that I am aware he is on lasix and is possible that increasing water and can increase his edema. Kind of hard balance but we have juggle and see  Dr. Brand Males, M.D., [facility].C.P Pulmonary and Critical Care Medicine Staff Physician, [place] Director - Interstitial Lung Disease  Program  Pulmonary [place] at [place], [place], [address]  Pager: [phone], If no answer or between  15:00h - 7:00h: call [phone] Telephone: [phone]

## 2018-03-06 NOTE — Telephone Encounter (Signed)
Pt is calling back [phone] 

## 2018-03-06 NOTE — Telephone Encounter (Signed)
Spoke with patient. He is aware of recs. Nothing else needed at time of call.

## 2018-03-11 ENCOUNTER — Telehealth: Payer: Self-pay | Admitting: Internal Medicine

## 2018-03-11 NOTE — Telephone Encounter (Signed)
Called and spoke with patient, he stated that he was supposed to come back for repeat lab work but forgot when. Advised patient that MR wanted him to come back with in 1-2 weeks. Patient will be here at the end of the week to get lab work. Nothing further needed.

## 2018-03-14 ENCOUNTER — Other Ambulatory Visit: Payer: Self-pay | Admitting: *Deleted

## 2018-03-14 ENCOUNTER — Other Ambulatory Visit (INDEPENDENT_AMBULATORY_CARE_PROVIDER_SITE_OTHER): Payer: Medicare Other

## 2018-03-14 DIAGNOSIS — J84112 Idiopathic pulmonary fibrosis: Secondary | ICD-10-CM | POA: Diagnosis not present

## 2018-03-14 DIAGNOSIS — Z5181 Encounter for therapeutic drug level monitoring: Secondary | ICD-10-CM | POA: Diagnosis not present

## 2018-03-14 LAB — BASIC METABOLIC PANEL
BUN: 32 mg/dL — ABNORMAL HIGH (ref 6–23)
CHLORIDE: 96 meq/L (ref 96–112)
CO2: 35 mEq/L — ABNORMAL HIGH (ref 19–32)
CREATININE: 1.62 mg/dL — AB (ref 0.40–1.50)
Calcium: 9.6 mg/dL (ref 8.4–10.5)
GFR: 44.33 mL/min — AB (ref >60.00)
Glucose, Bld: 114 mg/dL — ABNORMAL HIGH (ref 70–99)
Potassium: 3.9 mEq/L (ref 3.5–5.1)
Sodium: 140 mEq/L (ref 135–145)

## 2018-03-16 DIAGNOSIS — J849 Interstitial pulmonary disease, unspecified: Secondary | ICD-10-CM | POA: Diagnosis not present

## 2018-03-17 ENCOUNTER — Telehealth: Payer: Self-pay | Admitting: Internal Medicine

## 2018-03-17 NOTE — Telephone Encounter (Signed)
Creat some better. This is good. Is he feeling better in terms of fatigue with the lower dose of esbriet?  Thanks  Dr. Brand Males, M.D., [facility].C.P Pulmonary and Critical Care Medicine Staff Physician, [place] Director - Interstitial Lung Disease  Program  Pulmonary [place] at [place], [place], [address]  Pager: [phone], If no answer or between  15:00h - 7:00h: call [phone] Telephone: [phone]

## 2018-03-25 ENCOUNTER — Other Ambulatory Visit: Payer: Self-pay | Admitting: Internal Medicine

## 2018-03-25 ENCOUNTER — Telehealth: Payer: Self-pay | Admitting: Internal Medicine

## 2018-03-25 MED ORDER — ONDANSETRON HCL 8 MG PO TABS: 8.0000 mg | ORAL_TABLET | Freq: Every day | ORAL | 1 refills | Status: DC | PRN

## 2018-03-25 NOTE — Telephone Encounter (Signed)
Called and spoke to the patient's wife. She confirmed esbriet dose of being 1 pill BID. She stated that he was nauseous this morning but took the last dose of zofran and felt better. Confirmed pharmacy and sent in Rx for Zofran. Nothing further is needed at this time.

## 2018-03-25 NOTE — Telephone Encounter (Signed)
Pt is calling back [phone] 

## 2018-03-25 NOTE — Telephone Encounter (Signed)
Called and spoke with patients wife, she states that the patient was supposed to get a refill of his zofran at his last OV on 4.30. I do not see where this was stated or sent in.   Patient is not having any active symptoms.   MR please advise, thank you.

## 2018-03-25 NOTE — Telephone Encounter (Signed)
Called patient on numbers provided. Unable to reach left message to give [place] a call back.

## 2018-03-25 NOTE — Telephone Encounter (Signed)
If he ishaving nausea - can have some zofran 8mg  once daily as needed - 30 tablets with 1 refill  plese ensure he is on esbriet lower dose 2 pill tid (instead of 3 pills tild). If is the larger esbriet pill then he just takes 1 pill bid  Thanks  Dr. Brand Males, M.D., [facility].C.P Pulmonary and Critical Care Medicine Staff Physician, [place] Director - Interstitial Lung Disease  Program  Pulmonary [place] at [place], [place], [address]  Pager: [phone], If no answer or between  15:00h - 7:00h: call [phone] Telephone: [phone]

## 2018-04-01 NOTE — Telephone Encounter (Signed)
See 5/21 phone note.

## 2018-04-16 DIAGNOSIS — J849 Interstitial pulmonary disease, unspecified: Secondary | ICD-10-CM | POA: Diagnosis not present

## 2018-05-07 ENCOUNTER — Telehealth: Payer: Self-pay | Admitting: Internal Medicine

## 2018-05-07 NOTE — Telephone Encounter (Signed)
Attempted to contact pt. I did not receive an answer. There was no option for me to leave a message. Will try back.  

## 2018-05-09 NOTE — Telephone Encounter (Signed)
Pt wife [name] is calling back [phone]

## 2018-05-09 NOTE — Telephone Encounter (Signed)
Called [employer] @ [phone] and spoke with representative (difficult to understand name) Per [employer] rep, pt's copay card expired on 6.10.19 and this is why the cost was so great Per [employer] rep, the pharmaceutical company should be contacted to issue another  Looked online, unable to ascertain what number should be called Called spoke with patient to see if he has a card with a phone number >> he does not, but provided his case number: [id number]  Called the [employer] @ [phone] to see if she can offer any assistance - had to [facility] TCB x1.  Called spoke with patient to inform him of the above, pt voiced his understanding.  Pt does have enough Esbriet to last him about 1 week.  Advised pt we will continue to work on this for him.

## 2018-05-12 NOTE — Telephone Encounter (Signed)
Gus Rankin, with [employer] reimbursement called regarding patient Shawn Cruz, he will call [employer] to see what can be done to assist patient and call the office back Will await [name]'s call

## 2018-05-12 NOTE — Telephone Encounter (Signed)
Patient returned call, CB is [phone]

## 2018-05-12 NOTE — Telephone Encounter (Signed)
Shawn Cruz with Esbriet, returning call, CB is [phone]..  They called the patient and tried to connect him with co-pay assist and the patient is supposed to call them back if he is not able to get co-pay assist.

## 2018-05-12 NOTE — Telephone Encounter (Signed)
atc pt X2, line rang 3 times and then went to silence both times.  Wcb.

## 2018-05-12 NOTE — Telephone Encounter (Signed)
Shawn Cruz w/ Esbriet returned call He reported that they were indeed able to speak with patient and give him information regarding the copay assistance/grant information  Would still like to speak with patient to check on the status

## 2018-05-12 NOTE — Telephone Encounter (Signed)
Called and spoke with pt to clarify that he was able to speak with Gus Rankin in regards to copay assistance/grant information.  Pt verified that he was able to get everything taken care of in regards to help with Esbriet.  Nothing further needed.

## 2018-05-12 NOTE — Telephone Encounter (Signed)
Pt is calling about the Esbriet [phone]

## 2018-05-12 NOTE — Telephone Encounter (Signed)
ATC patient - no answer and no option to LM LMOM TCB x1 for Shawn Cruz to ensure that he was able to speak with patient since we have tried to reach him multiple times without success

## 2018-05-16 DIAGNOSIS — J849 Interstitial pulmonary disease, unspecified: Secondary | ICD-10-CM | POA: Diagnosis not present

## 2018-05-20 DIAGNOSIS — M47816 Spondylosis without myelopathy or radiculopathy, lumbar region: Secondary | ICD-10-CM | POA: Diagnosis not present

## 2018-05-26 ENCOUNTER — Telehealth: Payer: Self-pay | Admitting: Internal Medicine

## 2018-05-26 MED ORDER — PREDNISONE 10 MG PO TABS: ORAL_TABLET | ORAL | 0 refills | Status: DC

## 2018-05-26 NOTE — Telephone Encounter (Signed)
ILD clinic is full tomorrow afternoon, and patient has difficulty being transported to office.  Requesting recs over the phone if possible. MR please advise.  Thanks.

## 2018-05-26 NOTE — Telephone Encounter (Signed)
Spoke with the pt's spouse and notified of recs per MR  She verbalized understanding  Rx for pred sent Will call if not improving or seem emergent care

## 2018-05-26 NOTE — Telephone Encounter (Signed)
Called and spoke to pt's spouse, [name] [facility]). [name] stated pt is experiencing increased sob and decreased appetite Pt wearing 3L during the day and 4L qhs.  Pt is unable to lay flat due to increased sob. Pt is sleeping evaluated in chair.  During our conversation, [name] checked pt's sats- SPo2 94% HR 86.  Denied cough, wheezing or chest discomfort.  I have offered an appointment, [name] declined due to trouble transporting pt to office.  MR please advise. Thanks

## 2018-05-26 NOTE — Telephone Encounter (Signed)
Can thye come to ILD clinic tomorrow after 2.45om probably best spot

## 2018-05-26 NOTE — Telephone Encounter (Signed)
Hard to know if this fluid issue or IPF isse . These kinds of things prefer MD eval face to face due to complexity. Last time was fluid issue They can try   Take prednisone 40 mg daily x 2 days, then 20mg  daily x 2 days, then 10mg  daily x 2 days, then 5mg  daily x 2 days and stop  If above does NOT work then needs to come in or go to ER

## 2018-06-07 ENCOUNTER — Telehealth: Payer: Self-pay | Admitting: Internal Medicine

## 2018-06-07 DIAGNOSIS — J9611 Chronic respiratory failure with hypoxia: Secondary | ICD-10-CM

## 2018-06-07 DIAGNOSIS — J84112 Idiopathic pulmonary fibrosis: Secondary | ICD-10-CM

## 2018-06-07 DIAGNOSIS — J849 Interstitial pulmonary disease, unspecified: Secondary | ICD-10-CM

## 2018-06-07 NOTE — Telephone Encounter (Signed)
Call from wife Ida Uppal wife of Shawn Cruz - says [place] told her 06/07/2018 that he needs a larger o2 machine because currently on 4L [place] and easy desats with walking on that and current size of machine is not enough. Ok for you to call [facility] on Monday 06/09/18 and take care of it   Noticed no followup - wife says he is finding it hard to make it for followup. Offered hospice but then he said he will come for followup  Wife also said on and off easy sleepiness. Not on CPAP at home. No OSA dx  Plan Do HRCT - supine only; last 3 years ago Do ABG Return to ILD clinic next several weeks fine  Keep a pft slot AFTER my ILD clnic open so if I decide he do spiro/dlco he can after seeing me   Thanks  Dr. Brand Males, M.D., [facility].C.P Pulmonary and Critical Care Medicine Staff Physician, [place] Director - Interstitial Lung Disease  Program  Pulmonary [place] at [place], [place], [address]  Pager: [phone], If no answer or between  15:00h - 7:00h: call [phone] Telephone: [phone]

## 2018-06-09 NOTE — Telephone Encounter (Signed)
Order has been placed for pt to be provided with a larger size O2 tank due to the amount of O2 he is needing.  Order has been placed for ABG and also HRCT and have placed a scheduling instruction note for appt to be scheduled with MR once HRCT and ABG appts have been scheduled.

## 2018-06-10 ENCOUNTER — Telehealth: Payer: Self-pay | Admitting: Internal Medicine

## 2018-06-10 DIAGNOSIS — J9611 Chronic respiratory failure with hypoxia: Secondary | ICD-10-CM

## 2018-06-10 NOTE — Telephone Encounter (Signed)
Pt is aware that we have placed order for larger concentrator and he is aware of date and time for CT Scan. Nothing more needed at this time.

## 2018-06-11 DIAGNOSIS — J849 Interstitial pulmonary disease, unspecified: Secondary | ICD-10-CM | POA: Diagnosis not present

## 2018-06-11 DIAGNOSIS — M4716 Other spondylosis with myelopathy, lumbar region: Secondary | ICD-10-CM | POA: Diagnosis not present

## 2018-06-17 ENCOUNTER — Ambulatory Visit (INDEPENDENT_AMBULATORY_CARE_PROVIDER_SITE_OTHER)
Admission: RE | Admit: 2018-06-17 | Discharge: 2018-06-17 | Disposition: A | Discharge status: Home / Self Care | Payer: Medicare Other | Source: Ambulatory Visit | Attending: Internal Medicine | Admitting: Internal Medicine

## 2018-06-17 ENCOUNTER — Encounter (HOSPITAL_COMMUNITY): Payer: Self-pay

## 2018-06-17 ENCOUNTER — Ambulatory Visit (HOSPITAL_COMMUNITY)
Admission: RE | Admit: 2018-06-17 | Discharge: 2018-06-17 | Disposition: A | Discharge status: Home / Self Care | Payer: Medicare Other | Source: Ambulatory Visit | Attending: Internal Medicine | Admitting: Internal Medicine

## 2018-06-17 DIAGNOSIS — Z87891 Personal history of nicotine dependence: Secondary | ICD-10-CM | POA: Diagnosis not present

## 2018-06-17 DIAGNOSIS — J84112 Idiopathic pulmonary fibrosis: Secondary | ICD-10-CM | POA: Diagnosis not present

## 2018-06-17 DIAGNOSIS — J849 Interstitial pulmonary disease, unspecified: Secondary | ICD-10-CM

## 2018-06-17 DIAGNOSIS — R918 Other nonspecific abnormal finding of lung field: Secondary | ICD-10-CM | POA: Diagnosis not present

## 2018-06-17 LAB — BLOOD GAS, ARTERIAL
ACID-BASE EXCESS: 6.1 mmol/L — AB (ref 0.0–2.0)
Bicarbonate: 30.5 mmol/L — ABNORMAL HIGH (ref 20.0–28.0)
Drawn by: 280981
O2 Content: 4 L/min
O2 Saturation: 94.2 %
PH ART: 7.422 (ref 7.350–7.450)
PO2 ART: 71.6 mmHg — AB (ref 83.0–108.0)
Patient temperature: 98.6
pCO2 arterial: 47.6 mmHg (ref 32.0–48.0)

## 2018-06-20 NOTE — Progress Notes (Signed)
Spoke with pt and notified of results per Dr. Ramaswamy. Pt verbalized understanding and denied any questions.  

## 2018-07-10 ENCOUNTER — Encounter: Payer: Self-pay | Admitting: Internal Medicine

## 2018-07-10 ENCOUNTER — Ambulatory Visit: Payer: Medicare Other | Admitting: Internal Medicine

## 2018-07-10 ENCOUNTER — Telehealth: Payer: Self-pay | Admitting: Internal Medicine

## 2018-07-10 ENCOUNTER — Other Ambulatory Visit (INDEPENDENT_AMBULATORY_CARE_PROVIDER_SITE_OTHER): Payer: Medicare Other

## 2018-07-10 VITALS — BP 120/70 | HR 88 | Ht 67.5 in | Wt 213.8 lb

## 2018-07-10 DIAGNOSIS — R400 Somnolence: Secondary | ICD-10-CM | POA: Diagnosis not present

## 2018-07-10 DIAGNOSIS — R5381 Other malaise: Secondary | ICD-10-CM

## 2018-07-10 DIAGNOSIS — J84112 Idiopathic pulmonary fibrosis: Secondary | ICD-10-CM

## 2018-07-10 DIAGNOSIS — Z5181 Encounter for therapeutic drug level monitoring: Secondary | ICD-10-CM

## 2018-07-10 DIAGNOSIS — J9611 Chronic respiratory failure with hypoxia: Secondary | ICD-10-CM | POA: Diagnosis not present

## 2018-07-10 DIAGNOSIS — R5383 Other fatigue: Secondary | ICD-10-CM | POA: Insufficient documentation

## 2018-07-10 NOTE — Progress Notes (Signed)
5/31/2017Follow Up Appointment: Pt. Presents to the office for follow up. He feels he is more short of breath, especially with exertion. He also states he is fatigued easily.[name] He is taking 200 mg of Ofab daily. He is tolerating the 200 mg dose well. He was not tolerating the 300 mg dose.There has been a decline in PFT's over the last year of about 15%. We will discuss changing medication from Ofev to [place].   Tests: PFT's 04/04/2016:  FVC: 2.18/ 56% Decline over last year which was 2.34/59%   OV 05/23/2016  Chief Complaint  Shawn presents with  . Follow-up    pt states his SOB is stable- has been on esbriet X30 days, only side effect noticed is that increased fatigue and L foot swelling.  Pt has noticed swelling X2 wks.      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 Overall he is tolerating it fine. He wears sunscreen when he goes out for a prolonged period of time. No skin issues. He is having some fatigue. There no GI side effects. He is also noticing some worsening ankle edema particularly on the left side associated with foot edema in the last few weeks. He is wondering if it's related to Pirfenidone (Esbriet). He is on Lasix for chronic venous insufficiency and pedal edema at 40 mg twice a day. This no fever or chills or nausea vomiting. The fatigue is slightly worse than baseline. It is moderate in severity. He has never wanted her to pulmonary rehabilitation due to cost. In terms of dyspnea this is stable   OV 06/27/2016  Chief Complaint  Shawn presents with  . Idiopathic Pulmonary Fibrosis    Breathing is worse since last OV. Reports increased SOB, dry cough. Denies chest tightness, wheezing.    FU IPF with chronic hypoxemic resp fialure pn esbeit since June 2017  Tolerating esbriet fine. No skin issues. Having GERD per wife though he denied it. Relieved by TUMS. Not  taking PPI; wife feels he needs to be on PPI. Overall feels dyspnea is worse but denies chest pain or edema. Stil uses 3L [place] and does not desaturate below 89% on self monitoring.      OV 10/01/2016   Chief Complaint  Shawn presents with  . Follow-up    for PFT results. Pt. states he is breathing about the same. Wife states the opposite. Breathing is worse.    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 Fine. No skin issues. However wife begins to think that he is beginning to have low appetite and some weight loss. However he says because of his obesity visit this is a good thing for him. He tells me that he is stable. This 3 L of oxygen at rest he is able to do his ADLs and only very rarely desaturated to 86%. He says this is stable compared to his last visit in Aug  ust 2017. Off note he is yet to get his Pirfenidone (Esbriet) at one pill 3 times a day which is the 3 pills rolled and 1 pill. . Wife thinks he is beginning to decline because at night she feels that he is a bit more tachypneic. Pulmonary function test done this visit show stability compared to May 2017. It appears that he is stable and starting Pirfenidone (Esbriet). His wife is wondering about  transplant eligibility      OV 03/22/2017  Chief Complaint  Shawn presents with  . 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 There are no skin issues. He is tolerating Pirfenidone (Esbriet) just fine except for mild fatigue and some taste issues. But he definitely feels that the Pirfenidone (Esbriet) is better tolerated than the Ofev. He also feels that the aspirate has halted progression of disease. Otherwise no new issues. Spirometry and DLCO shows relative  stability in the last 1 year.   OV 07/23/2017  Chief Complaint  Shawn presents with  . Follow-up    PFT today. Pt states that he has had some issues with his SOB due to the climate, occ. cough. Denies any CP. DME: [facility], 56L O41.   77 year old male with idiopathic pulmonary fibrosis and chronic hypoxemic respiratory failure. Presents with wife Shawn Cruz. He tells me that over time in the last 1 year he feels more short of breath but still has a good functional quality of life. He is stable on Pirfenidone (Esbriet). Able to tolerated well. Is due for liver function test today. Interstitial lung disease symptom score shows significant symptom burden. Also lung function shows a 10% decline in FVC and DLCO in the last 1 year. At this point in time he wants a lighter portable oxygen system. Also of note he asked if I could refill his pain medications because his neurosurgeon Dr. Glenna Fellows retiring. I defer this to primary care physician or pain clinic or to the neurosurgery practice   OV 03/04/2018  Chief Complaint  Shawn presents with  . Follow-up    PFT done today.  Pt states his breathing has become worse and has a mild cough. Per pt's wife, pt has no energy, little appetite, and is losing strength. Pt is still taking Esbriet and only complaint is it has been messing with his appetite. Denies any CP.    Follow-up idiopathic pulmonary fibrosis . DX April 13, 2015 via biopsy [facility]. STopped ofev spring 2017 due to diarrhea/intolerance. On esbrit since June 2017. Also with chronic hypoxemic respiratory failure on 3 L of oxygen   Shawn Cruz presents with his wife. He tells me in the last 3 months he feels more short of breath than usual. He thinks he might be desaturating when he walks on 3 L of oxygen to the bathroom and back although this is not properly documented t home. He remains on 3 L.  He remains on esbriet. Does have a constellation of mild nausea and moderate amount of fatigue and some20 pound  weight loss in the last few years. He feels this is because of Pirfenidone (Esbriet). Walking desaturation test on 03/04/2018 185 feet x 3 laps on  3L [place]   - did only  2 laps and slow and stopped due to severe dyspnea. did *NOTdesaturate. Rest pulse ox was 99%, final pulse ox was 93%. HR response 86/min at rest to 104/min at peak exertion. Shawn Cruz  Did not Desaturate < 88% . Shawn Cruz did yes  Desaturated </= 3% points. Shawn Cruz did not get tachyardic  PFT show stability since Sept 2018  LAb revewi shows mild decrease in GFR jan 2019    OV 07/10/2018  Subjective:  Shawn ID: Shawn Cruz, male ,  DOB: September 20, 1941 , age 77 y.o. , MRN: [id number] , ADDRESS: [place] [address]   07/10/2018 -   Chief Complaint  Shawn presents with  . Follow-up    CT performed 8/13.  Pt states when he had the CT scan performed as well as ABG, he was having a hard time breathing. Pt states his breathing has been stable since was put on steroids. Pt is also coughing which is nonproductive. Denies any CP.    Follow-up idiopathic pulmonary fibrosis . DX April 13, 2015 via biopsy [facility]. STopped ofev spring 2017 due to diarrhea/intolerance. On esbrit since June 2017. Also with chronic hypoxemic respiratory failure on 3 L of oxygen    HPI Shawn Cruz 77 y.o. - presents for follow-up of his IPF.last visit end of April 2019. At that point we reduced his Pirfenidone (Esbriet)  To two third of normal dose. He takes one large pill 2 times a day. He says he has no side effects. Review of the chart indicates a rational for reducing the dose was mild chronic renal insufficiency. At this point in time he says he tolerates the Pirfenidone (Esbriet) quite fine. Recently had a flareup and he got a shot on prednisone which helped him but he is back to baseline. His wife Mrs. Shawn Cruz is frustrated that he is excessively fatigued and sleepy during the daytime. There is also  lack of motivation which the Shawn admits. He is always refused to go to rehabilitation in the past. The wife is asking abouttesting for sleep apnea but the Shawn refusedsaying that he will never wear his CPAP because he cannot tolerate it. Wife then asked for chronic daily prednisone we discussed the side effects and the risks and chronic IPF management be on the situation of a flareup or treatment of severe cough. Based on this Shawn is not inclined.We discussed potential pulmonary rehabilitation but he refused again. However he is open to physical therapy at home. He is also open to increasing the dose of the Pirfenidone (Esbriet) a full dose.he is also agreeable to makingfollow-up every 6-12 weeks.      Results for Shawn Cruz, Shawn Cruz (MRN [id number]) as of 03/04/2018 11:39  Ref. Range 10/01/2016 15:00 03/22/2017 08:56 07/23/2017 10:55 03/04/2018 10:52  FVC-Pre Latest Units: L 2.09 2.13 1.84 1.84  FVC-%Pred-Pre Latest Units: % 54 55 47 48   Results for Shawn Cruz, Shawn Cruz (MRN [id number]) as of 03/04/2018 11:39  Ref. Range 10/01/2016 15:00 03/22/2017 08:56 07/23/2017 10:55 03/04/2018 10:52  DLCO unc Latest Units: ml/min/mmHg 13.06 11.00 12.11 12.45  DLCO unc % pred Latest Units: % 45 37 41 42   ROS - per HPI     has a past medical history of Anxiety, Chronic kidney disease, DDD (degenerative disc disease), Diabetes mellitus, Fibromyalgia, GERD (gastroesophageal reflux disease), Gout, High cholesterol, Hyperglycemia, Hypertension, Shortness of breath dyspnea, and Sleep apnea.   reports that he quit smoking about 12 years ago. His smoking use included cigarettes. He has a 50.00 pack-year smoking history. He has quit using smokeless tobacco.  His smokeless tobacco use included chew.  Past Surgical History:  Procedure Laterality Date  . BACK SURGERY     total of 3 surgeries  . back surrgery     2013  . CARDIAC CATHETERIZATION     01/2015  . EYE SURGERY     bilateral cataracts  . HERNIA  REPAIR     upper ventral hernia (had as child)  . LEFT HEART CATHETERIZATION  WITH CORONARY ANGIOGRAM N/A 01/11/2015   Procedure: LEFT HEART CATHETERIZATION WITH CORONARY ANGIOGRAM;  Surgeon: Adrian Prows, MD;  Location: [facility] CATH LAB;  Service: Cardiovascular;  Laterality: N/A;  . LITHOTRIPSY    . lumbar discetomy     1977  . LUNG BIOPSY Left 04/13/2015   Procedure: LUNG BIOPSY;  Surgeon: [name], MD;  Location: [place];  Service: Thoracic;  Laterality: Left;  . removal of kidney stone     thru surgery  . rotator cuff repair     Dr. Durward Fortes  . VIDEO ASSISTED THORACOSCOPY Left 04/13/2015   Procedure: VIDEO ASSISTED THORACOSCOPY;  Surgeon: [name], MD;  Location: [place];  Service: Thoracic;  Laterality: Left;  [name] VIDEO BRONCHOSCOPY N/A 04/13/2015   Procedure: VIDEO BRONCHOSCOPY;  Surgeon: [name], MD;  Location: [facility] OR;  Service: Thoracic;  Laterality: N/A;    Allergies  Allergen Reactions  . Ofev [Nintedanib] Diarrhea  . Sulfonamide Derivatives     REACTION: Age 31, leg swelling    Immunization History  Administered Date(s) Administered  . Influenza Split 07/06/2014  . Influenza Whole 07/26/2011  . Influenza, High Dose Seasonal PF 07/15/2017  . Influenza,inj,Quad PF,6+ Mos 08/04/2013  . Influenza-Unspecified 07/22/2015  . Pneumococcal Conjugate-13 08/05/2014  . Pneumococcal Polysaccharide-23 08/22/2012    Family History  Problem Relation Age of Onset  . Hypertension Mother   . Heart attack Mother        age 48's  . Hypertension Father   . Heart attack Father        age 77's  . Aortic aneurysm Sister      Current Outpatient Medications:  .  allopurinol (ZYLOPRIM) 300 MG tablet, Take 300 mg by mouth daily., Disp: , Rfl:  .  aspirin EC 81 MG tablet, Take 81 mg by mouth daily., Disp: , Rfl:  .  atorvastatin (LIPITOR) 10 MG tablet, Take 10 mg by mouth daily at 6 PM. , Disp: , Rfl:  .  chlorpheniramine (CHLOR-TRIMETON) 4 MG tablet, Take 4 mg by mouth 2 (two)  times daily as needed for allergies., Disp: , Rfl:  .  Cholecalciferol (VITAMIN D) 2000 UNITS CAPS, Take 5,000 Units by mouth daily. , Disp: , Rfl:  .  citalopram (CELEXA) 40 MG tablet, Take 1 tablet by mouth daily., Disp: , Rfl: 6 .  cyanocobalamin 100 MCG tablet, Take 100 mcg by mouth daily., Disp: , Rfl:  .  ESBRIET 801 MG TABS, TAKE 1 TABLET BY MOUTH THREE TIMES DAILY WITH FOOD, Disp: 90 tablet, Rfl: 11 .  furosemide (LASIX) 40 MG tablet, TAKE 1 AND 1/2 TABLETS BY MOUTH EVERY MORNING AND 1 TABLET EVERY EVENING, Disp: 225 tablet, Rfl: 0 .  HYDROcodone-acetaminophen (NORCO) 10-325 MG per tablet, Take 1 tablet by mouth every 6 (six) hours as needed., Disp: , Rfl:  .  Loperamide HCl (IMODIUM PO), Take by mouth., Disp: , Rfl:  .  ondansetron (ZOFRAN) 4 MG tablet, Take 1-2 tablets prn daily (Shawn not taking: Reported on 07/10/2018), Disp: 30 tablet, Rfl: 0 .  ondansetron (ZOFRAN) 8 MG tablet, Take 1 tablet (8 mg total) by mouth daily as needed for nausea or vomiting. (Shawn not taking: Reported on 07/10/2018), Disp: 30 tablet, Rfl: 1 .  potassium chloride (KLOR-CON) 20 MEQ packet, Take 20 mEq by mouth daily., Disp: 7 tablet, Rfl: 0      Objective:   Vitals:   07/10/18 1605 07/10/18 1630 07/10/18 1632  BP: 120/70    Pulse: 88  SpO2: 97% (!) 87% 91%  Weight: 213 lb 12.8 oz (97 kg)    Height: 5' 7.5" (1.715 m)      Estimated body mass index is 32.99 kg/m as calculated from the following:   Height as of this encounter: 5' 7.5" (1.715 m).   Weight as of this encounter: 213 lb 12.8 oz (97 kg).  @WEIGHTCHANGE @  [employer]   07/10/18 1605  Weight: 213 lb 12.8 oz (97 kg)     Physical Exam  General Appearance:    Alert, cooperative, no distress, appears stated age - yes , sitting on - chair  Head:    Normocephalic, without obvious abnormality, atraumatic  Eyes:    PERRL, conjunctiva/corneas clear,  Ears:    Normal TM's and external ear canals, both ears  Nose:   Nares normal,  septum midline, mucosa normal, no drainage    or sinus tenderness. OXYGEN ON  - yes . Shawn is @ 3LNC   Throat:   Lips, mucosa, and tongue normal; teeth and gums normal. Cyanosis on lips - no  Neck:   Supple, symmetrical, trachea midline, no adenopathy;    thyroid:  no enlargement/tenderness/nodules; no carotid   bruit or JVD  Back:     Symmetric, no curvature, ROM normal, no CVA tenderness  Lungs:     Distress - no , Wheeze no, Barrell Chest - no, Purse lip breathing - no, Crackles - yes   Chest Wall:    No tenderness or deformity. Scars in chest -yes   Heart:    Regular rate and rhythm, S1 and S2 normal, no murmur, rub   or gallop  Breast Exam:    NOT DONE  Abdomen:     Soft, non-tender, bowel sounds active all four quadrants,    no masses, no organomegaly  Genitalia:   NOT DONE  Rectal:   NOT DONE  Extremities:   Extremities normal, atraumatic, Clubbing - no, Edema - noyes  Pulses:   2+ and symmetric all extremities  Skin:   Stigmata of Connective Tissue Disease - n  Lymph nodes:   Cervical, supraclavicular, and axillary nodes normal  Psychiatric:  Neurologic:   pleasant CNII-XII intact, normal strength, sensation  throughout           Assessment:       ICD-10-CM   1. Chronic hypoxemic respiratory failure (HCC) J96.11   2. IPF (idiopathic pulmonary fibrosis) ([place]) J84.112   3. Encounter for therapeutic drug monitoring Z51.81   4. Other fatigue R53.83   5. Daytime sleepiness R40.0   6. Physical deconditioning R53.81        Plan:     Chronic hypoxemic respiratory failure (HCC) IPF (idiopathic pulmonary fibrosis) (HCC) Encounter for therapeutic drug monitoring  - IPF slowly worse over tme - cotnnue but increase esbriet from 1 pill twice daily to 1 pill three times daily (large tab)  - continue o2 - in 4-6 weeks do Pre-bd spiro and dlco only. No lung volume or bd response. No post-bd spiro - respect deferral of flu shot - check LFT 07/10/2018 - hold off chronic  prednisone (usually meant for flare in IPF) but if needed we can decide on case by case basis at followuo   Other fatigue Daytime sleepiness Physical deconditioning   - respect deferral of sleep evaluation  - will see if [facility] or agency can do home PT for above issues  Followup  in 4-6 weeks ILD clinic but after spirometry/dlco       >  50% of this > 25 min visit spent in face to face counseling or coordination of care - by this undersigned MD - Dr Brand Males. This includes one or more of the following documented above: discussion of test results, diagnostic or treatment recommendations, prognosis, risks and benefits of management options, instructions, education, compliance or risk-factor reduction    SIGNATURE    Dr. Brand Males, M.D., F.C.C.P,  Pulmonary and Critical Care Medicine Staff Physician, [place] Director - Interstitial Lung Disease  Program  Pulmonary [place] at [place], [place], [address]  Pager: [phone], If no answer or between  15:00h - 7:00h: call [phone] Telephone: [phone]  4:44 PM 07/10/2018

## 2018-07-10 NOTE — Patient Instructions (Addendum)
Chronic hypoxemic respiratory failure (HCC) IPF (idiopathic pulmonary fibrosis) ([place]) Encounter for therapeutic drug monitoring  - IPF slowly worse over tme - cotnnue but increase esbriet from 1 pill twice daily to 1 pill three times daily (large tab)  - continue o2 - in 4-6 weeks do Pre-bd spiro and dlco only. No lung volume or bd response. No post-bd spiro - respect deferral of flu shot - check LFT 07/10/2018 - hold off chronic prednisone (usually meant for flare in IPF) but if needed we can decide on case by case basis at followuo   Other fatigue Daytime sleepiness Physical deconditioning   - respect deferral of sleep evaluation  - will see if [facility] or agency can do home PT for above issues  Followup  in 4-6 weeks ILD clinic but after spirometry/dlco

## 2018-07-10 NOTE — Telephone Encounter (Signed)
Please call lab and see if they can run a bmet on blood work from 07/10/2018  Thanks    SIGNATURE    Dr. Brand Males, M.D., F.C.C.P,  Pulmonary and Critical Care Medicine Staff Physician, [place] Director - Interstitial Lung Disease  Program  Pulmonary [place] at [place], [place], [address]  Pager: [phone], If no answer or between  15:00h - 7:00h: call [phone] Telephone: [phone]  7:34 PM 07/10/2018

## 2018-07-11 ENCOUNTER — Other Ambulatory Visit (INDEPENDENT_AMBULATORY_CARE_PROVIDER_SITE_OTHER): Payer: Medicare Other

## 2018-07-11 DIAGNOSIS — J84112 Idiopathic pulmonary fibrosis: Secondary | ICD-10-CM

## 2018-07-11 LAB — BASIC METABOLIC PANEL
BUN: 39 mg/dL — AB (ref 6–23)
CHLORIDE: 97 meq/L (ref 96–112)
CO2: 30 meq/L (ref 19–32)
Calcium: 10 mg/dL (ref 8.4–10.5)
Creatinine, Ser: 1.61 mg/dL — ABNORMAL HIGH (ref 0.40–1.50)
GFR: 44.61 mL/min — ABNORMAL LOW (ref >60.00)
GLUCOSE: 81 mg/dL (ref 70–99)
Potassium: 4.4 mEq/L (ref 3.5–5.1)
SODIUM: 140 meq/L (ref 135–145)

## 2018-07-11 LAB — HEPATIC FUNCTION PANEL
ALBUMIN: 4.1 g/dL (ref 3.5–5.2)
ALT: 26 U/L (ref 0–53)
AST: 28 U/L (ref 0–37)
Alkaline Phosphatase: 71 U/L (ref 39–117)
Bilirubin, Direct: 0 mg/dL (ref 0.0–0.3)
Total Bilirubin: 0.3 mg/dL (ref 0.2–1.2)
Total Protein: 7.9 g/dL (ref 6.0–8.3)

## 2018-07-11 NOTE — Telephone Encounter (Signed)
thanks

## 2018-07-11 NOTE — Telephone Encounter (Signed)
Spoke with Santiago Glad in lab, verified that a BMET can be added.  Order placed and add-on sheet faxed to lab.  Forwarding to MR as FYI.

## 2018-07-12 DIAGNOSIS — M4716 Other spondylosis with myelopathy, lumbar region: Secondary | ICD-10-CM | POA: Diagnosis not present

## 2018-07-12 DIAGNOSIS — J849 Interstitial pulmonary disease, unspecified: Secondary | ICD-10-CM | POA: Diagnosis not present

## 2018-08-04 ENCOUNTER — Telehealth: Payer: Self-pay | Admitting: Internal Medicine

## 2018-08-04 DIAGNOSIS — J84112 Idiopathic pulmonary fibrosis: Secondary | ICD-10-CM

## 2018-08-04 NOTE — Telephone Encounter (Signed)
Per MR-    incarese lasix to 60mg  bid Tomorrow before he sees me - check cbc, bmet, lft, bnp - atleast 30 min before And in office - cMA to turn his o2 to room air for 5-34min and check his puilse ox     Called and spoke with Patient.  MR recommendations given.  Explained dosage for Lasix, 40mg  tabs( take 1 1/2 tabs BID). Labs ordered.  Explained the need to arrive 30 minutes early for lab work.  Will route to Raquel Sarna to be aware of appt tomorrow 08/05/18, upcomming labs and to check O2 at OV.

## 2018-08-04 NOTE — Telephone Encounter (Signed)
Called and spoke with patient and wife.  State feet are painful, swollen, and scaley. Patient had difficulty sleeping last night because he had trouble catching his breath. Patient had to sleep sitting up on and off all last night due to SOB.  Patient denies chest tightness, wheezing, or cough and congestion.  MR please advise.

## 2018-08-04 NOTE — Telephone Encounter (Signed)
This is 2nd go around like this and even last time was very hard to manage on phone. What is the dose of lasix he is on? And how much o2 is he on? And change in that?

## 2018-08-04 NOTE — Telephone Encounter (Signed)
incarese lasix to 60mg  bid Tomorrow before he sees me - check cbc, bmet, lft, bnp - atleast 30 min before And in office - cMA to turn his o2 to room air for 5-84min and check his puilse ox

## 2018-08-04 NOTE — Telephone Encounter (Signed)
Spoke with pt's wife. She states that the pt is taking 60mg  in the morning and 40mg  in the evening. Pt is currently on 4L/min of oxygen. He has been scheduled to come in and see MR on 08/05/18 at 11am.  MR - please advise if you recommend anything further. Thanks.

## 2018-08-05 ENCOUNTER — Other Ambulatory Visit (INDEPENDENT_AMBULATORY_CARE_PROVIDER_SITE_OTHER): Payer: Medicare Other

## 2018-08-05 ENCOUNTER — Encounter: Payer: Self-pay | Admitting: Internal Medicine

## 2018-08-05 ENCOUNTER — Ambulatory Visit: Payer: Medicare Other | Admitting: Internal Medicine

## 2018-08-05 VITALS — BP 116/64 | HR 79 | Ht 67.5 in | Wt 214.8 lb

## 2018-08-05 DIAGNOSIS — J9611 Chronic respiratory failure with hypoxia: Secondary | ICD-10-CM

## 2018-08-05 DIAGNOSIS — Z5181 Encounter for therapeutic drug level monitoring: Secondary | ICD-10-CM

## 2018-08-05 DIAGNOSIS — L819 Disorder of pigmentation, unspecified: Secondary | ICD-10-CM

## 2018-08-05 DIAGNOSIS — J84112 Idiopathic pulmonary fibrosis: Secondary | ICD-10-CM | POA: Diagnosis not present

## 2018-08-05 DIAGNOSIS — L03119 Cellulitis of unspecified part of limb: Secondary | ICD-10-CM

## 2018-08-05 LAB — HEPATIC FUNCTION PANEL
ALBUMIN: 3.8 g/dL (ref 3.5–5.2)
ALT: 18 U/L (ref 0–53)
AST: 23 U/L (ref 0–37)
Alkaline Phosphatase: 59 U/L (ref 39–117)
BILIRUBIN TOTAL: 0.4 mg/dL (ref 0.2–1.2)
Bilirubin, Direct: 0.1 mg/dL (ref 0.0–0.3)
TOTAL PROTEIN: 7.4 g/dL (ref 6.0–8.3)

## 2018-08-05 LAB — CBC WITH DIFFERENTIAL/PLATELET
BASOS ABS: 0.1 10*3/uL (ref 0.0–0.1)
BASOS PCT: 1 % (ref 0.0–3.0)
EOS ABS: 0.6 10*3/uL (ref 0.0–0.7)
Eosinophils Relative: 6.8 % — ABNORMAL HIGH (ref 0.0–5.0)
HEMATOCRIT: 39.3 % (ref 39.0–52.0)
HEMOGLOBIN: 13.3 g/dL (ref 13.0–17.0)
LYMPHS PCT: 16.4 % (ref 12.0–46.0)
Lymphs Abs: 1.4 10*3/uL (ref 0.7–4.0)
MCHC: 33.7 g/dL (ref 30.0–36.0)
MCV: 93.6 fl (ref 78.0–100.0)
MONO ABS: 0.8 10*3/uL (ref 0.1–1.0)
Monocytes Relative: 9 % (ref 3.0–12.0)
Neutro Abs: 5.6 10*3/uL (ref 1.4–7.7)
Neutrophils Relative %: 66.8 % (ref 43.0–77.0)
Platelets: 242 10*3/uL (ref 150.0–400.0)
RBC: 4.2 Mil/uL — AB (ref 4.22–5.81)
RDW: 14.1 % (ref 11.5–15.5)
WBC: 8.5 10*3/uL (ref 4.0–10.5)

## 2018-08-05 LAB — BASIC METABOLIC PANEL
BUN: 36 mg/dL — ABNORMAL HIGH (ref 6–23)
CO2: 32 mEq/L (ref 19–32)
CREATININE: 1.74 mg/dL — AB (ref 0.40–1.50)
Calcium: 9.8 mg/dL (ref 8.4–10.5)
Chloride: 97 mEq/L (ref 96–112)
GFR: 40.78 mL/min — ABNORMAL LOW (ref >60.00)
Glucose, Bld: 115 mg/dL — ABNORMAL HIGH (ref 70–99)
POTASSIUM: 3.2 meq/L — AB (ref 3.5–5.1)
Sodium: 139 mEq/L (ref 135–145)

## 2018-08-05 LAB — BRAIN NATRIURETIC PEPTIDE: PRO B NATRI PEPTIDE: 140 pg/mL — AB (ref 0.0–100.0)

## 2018-08-05 MED ORDER — CEPHALEXIN 500 MG PO CAPS: 500.0000 mg | ORAL_CAPSULE | Freq: Four times a day (QID) | ORAL | 0 refills | Status: DC

## 2018-08-05 NOTE — Patient Instructions (Signed)
Cellulitis of lower extremity, unspecified laterality Discoloration of skin of multiple sites of lower extremity  - cephalexin 500mg  four  times daily x  7 days - pressur stockings - lasix at 60mg  twice daily - refer [facility] consultation due to intense discoloration of feet and coolness though can feel pulses   IPF (idiopathic pulmonary fibrosis) (HCC) Chronic hypoxemic respiratory failure (HCC) Encounter for therapeutic drug monitoring  - seems stable - change upcoming IPF Followup to 4 weeks from now - spirometry/dlco in 4 weeks  Followup ILD clinic in 4 weeks

## 2018-08-05 NOTE — Telephone Encounter (Signed)
Pt was seen today, 08/05/18 for the OV with MR. Labs were done before pt arrived upstairs for the acute visit.  Pt has been told to take 60mg  lasis bid and abx was also ordered at today's visit.  Pt also scheduling for a consult to [facility] due to the discoloration of feet and legs.  Nothing further needed.

## 2018-08-05 NOTE — Progress Notes (Signed)
5/31/2017Follow Up Appointment: Pt. Presents to the office for follow up. He feels he is more short of breath, especially with exertion. He also states he is fatigued easily.Shawn Cruz He is taking 200 mg of Ofab daily. He is tolerating the 200 mg dose well. He was not tolerating the 300 mg dose.There has been a decline in PFT's over the last year of about 15%. We will discuss changing medication from Ofev to [place].   Tests: PFT's 04/04/2016:  FVC: 2.18/ 56% Decline over last year which was 2.34/59%   OV 05/23/2016  Chief Complaint  Patient presents with  . Follow-up    pt states his SOB is stable- has been on esbriet X30 days, only side effect noticed is that increased fatigue and L foot swelling.  Pt has noticed swelling X2 wks.      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 Overall he is tolerating it fine. He wears sunscreen when he goes out for a prolonged period of time. No skin issues. He is having some fatigue. There no GI side effects. He is also noticing some worsening ankle edema particularly on the left side associated with foot edema in the last few weeks. He is wondering if it's related to Pirfenidone (Esbriet). He is on Lasix for chronic venous insufficiency and pedal edema at 40 mg twice a day. This no fever or chills or nausea vomiting. The fatigue is slightly worse than baseline. It is moderate in severity. He has never wanted her to pulmonary rehabilitation due to cost. In terms of dyspnea this is stable   OV 06/27/2016  Chief Complaint  Patient presents with  . Idiopathic Pulmonary Fibrosis    Breathing is worse since last OV. Reports increased SOB, dry cough. Denies chest tightness, wheezing.    FU IPF with chronic hypoxemic resp fialure pn esbeit since June 2017  Tolerating esbriet fine. No skin issues. Having GERD per wife though he denied it. Relieved by TUMS. Not  taking PPI; wife feels he needs to be on PPI. Overall feels dyspnea is worse but denies chest pain or edema. Stil uses 3L [place] and does not desaturate below 89% on self monitoring.      OV 10/01/2016   Chief Complaint  Patient presents with  . Follow-up    for PFT results. Pt. states he is breathing about the same. Wife states the opposite. Breathing is worse.    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 Fine. No skin issues. However wife begins to think that he is beginning to have low appetite and some weight loss. However he says because of his obesity visit this is a good thing for him. He tells me that he is stable. This 3 L of oxygen at rest he is able to do his ADLs and only very rarely desaturated to 86%. He says this is stable compared to his last visit in Aug  ust 2017. Off note he is yet to get his Pirfenidone (Esbriet) at one pill 3 times a day which is the 3 pills rolled and 1 pill. . Wife thinks he is beginning to decline because at night she feels that he is a bit more tachypneic. Pulmonary function test done this visit show stability compared to May 2017. It appears that he is stable and starting Pirfenidone (Esbriet). His wife is wondering about  transplant eligibility      OV 03/22/2017  Chief Complaint  Patient presents with  . 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 There are no skin issues. He is tolerating Pirfenidone (Esbriet) just fine except for mild fatigue and some taste issues. But he definitely feels that the Pirfenidone (Esbriet) is better tolerated than the Ofev. He also feels that the aspirate has halted progression of disease. Otherwise no new issues. Spirometry and DLCO shows relative  stability in the last 1 year.   OV 07/23/2017  Chief Complaint  Patient presents with  . Follow-up    PFT today. Pt states that he has had some issues with his SOB due to the climate, occ. cough. Denies any CP. DME: [facility], 71L O55.   77 year old male with idiopathic pulmonary fibrosis and chronic hypoxemic respiratory failure. Presents with wife Shawn Cruz. He tells me that over time in the last 1 year he feels more short of breath but still has a good functional quality of life. He is stable on Pirfenidone (Esbriet). Able to tolerated well. Is due for liver function test today. Interstitial lung disease symptom score shows significant symptom burden. Also lung function shows a 10% decline in FVC and DLCO in the last 1 year. At this point in time he wants a lighter portable oxygen system. Also of note he asked if I could refill his pain medications because his neurosurgeon Dr. Glenna Fellows retiring. I defer this to primary care physician or pain clinic or to the neurosurgery practice   OV 03/04/2018  Chief Complaint  Patient presents with  . Follow-up    PFT done today.  Pt states his breathing has become worse and has a mild cough. Per pt's wife, pt has no energy, little appetite, and is losing strength. Pt is still taking Esbriet and only complaint is it has been messing with his appetite. Denies any CP.    Follow-up idiopathic pulmonary fibrosis . DX April 13, 2015 via biopsy [facility]. STopped ofev spring 2017 due to diarrhea/intolerance. On esbrit since June 2017. Also with chronic hypoxemic respiratory failure on 3 L of oxygen   Mr. Shawn Cruz presents with his wife. He tells me in the last 3 months he feels more short of breath than usual. He thinks he might be desaturating when he walks on 3 L of oxygen to the bathroom and back although this is not properly documented t home. He remains on 3 L.  He remains on esbriet. Does have a constellation of mild nausea and moderate amount of fatigue and some20 pound  weight loss in the last few years. He feels this is because of Pirfenidone (Esbriet).   Walking desaturation test on 03/04/2018 185 feet x 3 laps on  3L [place]   - did only  2 laps and slow and stopped due to severe dyspnea. did *NOTdesaturate. Rest pulse ox was 99%, final pulse ox was 93%. HR response 86/min at rest to 104/min at peak exertion. Patient Shawn Cruz  Did not Desaturate < 88% . Shawn Cruz did yes  Desaturated </= 3% points. Shawn Cruz did not get tachyardic  PFT show stability since Sept 2018  LAb revewi shows mild decrease in GFR jan 2019    OV 07/10/2018  Subjective:  Patient ID: Shawn Cruz  Blomquist, male , DOB: January 07, 1941 , age 46 y.o. , MRN: [id number] , ADDRESS: [place] [id number]   07/10/2018 -   Chief Complaint  Patient presents with  . Follow-up    CT performed 8/13.  Pt states when he had the CT scan performed as well as ABG, he was having a hard time breathing. Pt states his breathing has been stable since was put on steroids. Pt is also coughing which is nonproductive. Denies any CP.    Follow-up idiopathic pulmonary fibrosis . DX April 13, 2015 via biopsy [facility]. STopped ofev spring 2017 due to diarrhea/intolerance. On esbrit since June 2017. Also with chronic hypoxemic respiratory failure on 3 L of oxygen    HPI DEMONT LINFORD 77 y.o. - presents for follow-up of his IPF.last visit end of April 2019. At that point we reduced his Pirfenidone (Esbriet)  To two third of normal dose. He takes one large pill 2 times a day. He says he has no side effects. Review of the chart indicates a rational for reducing the dose was mild chronic renal insufficiency. At this point in time he says he tolerates the Pirfenidone (Esbriet) quite fine. Recently had a flareup and he got a shot on prednisone which helped him but he is back to baseline. His wife Mrs. [name] is frustrated that he is excessively fatigued and sleepy during the daytime. There is  also lack of motivation which the patient admits. He is always refused to go to rehabilitation in the past. The wife is asking abouttesting for sleep apnea but the patient refusedsaying that he will never wear his CPAP because he cannot tolerate it. Wife then asked for chronic daily prednisone we discussed the side effects and the risks and chronic IPF management be on the situation of a flareup or treatment of severe cough. Based on this patient is not inclined.We discussed potential pulmonary rehabilitation but he refused again. However he is open to physical therapy at home. He is also open to increasing the dose of the Pirfenidone (Esbriet) a full dose.he is also agreeable to makingfollow-up every 6-12 weeks.    - per HPI   OV 08/05/2018  Subjective:  Patient ID: Shawn Cruz, male , DOB: 26-Jun-1941 , age 89 y.o. , MRN: [id number] , ADDRESS: [place] [id number]   08/05/2018 -   Chief Complaint  Patient presents with  . Acute Home Visit    Pt has had increased leg swelling and SOB. Pt's feet and legs are both red and purple which keeps getting worse. Pt states SOB is about the same as last visit.     HPI Shawn Cruz 77 y.o. -IPF patient.  He supposed to come and see me for routine IPF follow-up in the next few days.  However he called in acutely yesterday and his wife also called in.  Therefore he has been brought in acutely.  He says his pulmonary status on 4 L of oxygen and his current intake is stable.  However he has had worsening pedal edema.  For the last several weeks he has been sleeping in a chair.  According to him he is actually been sleeping in a chair for the last few months.  And in the last week or few weeks there is been a purplish discoloration of both feet.  He denies being a diabetic.  There is no wound ulcers.  There is no trauma.  There is no fever or blister.  His calves are warm according to his history.  There is also scaling of the skin.  He  is on Lasix 60 mg in the morning and 40 mg at night.  He has done blood work today and the results are pending.     Results for PACO, CISLO (MRN [id number]) as of 03/04/2018 11:39  Ref. Range 10/01/2016 15:00 03/22/2017 08:56 07/23/2017 10:55 03/04/2018 10:52  FVC-Pre Latest Units: L 2.09 2.13 1.84 1.84  FVC-%Pred-Pre Latest Units: % 54 55 47 48   Results for ELHADJ, GIRTON (MRN [id number]) as of 03/04/2018 11:39  Ref. Range 10/01/2016 15:00 03/22/2017 08:56 07/23/2017 10:55 03/04/2018 10:52  DLCO unc Latest Units: ml/min/mmHg 13.06 11.00 12.11 12.45  DLCO unc % pred Latest Units: % 45 37 41 42    LABS    PULMONARY No results for input(s): PHART, PCO2ART, PO2ART, HCO3, TCO2, O2SAT in the last 168 hours.  Invalid input(s): PCO2, PO2  CBC No results for input(s): HGB, HCT, WBC, PLT in the last 168 hours.  COAGULATION No results for input(s): INR in the last 168 hours.  CARDIAC  No results for input(s): TROPONINI in the last 168 hours. No results for input(s): PROBNP in the last 168 hours.   CHEMISTRY No results for input(s): NA, K, CL, CO2, GLUCOSE, BUN, CREATININE, CALCIUM, MG, PHOS in the last 168 hours. CrCl cannot be calculated (Patient's most recent lab result is older than the maximum 21 days allowed.).   LIVER No results for input(s): AST, ALT, ALKPHOS, BILITOT, PROT, ALBUMIN, INR in the last 168 hours.   INFECTIOUS No results for input(s): LATICACIDVEN, PROCALCITON in the last 168 hours.   ENDOCRINE CBG (last 3)  No results for input(s): GLUCAP in the last 72 hours.       IMAGING x48h  - image(s) personally visualized  -   highlighted in bold No results found.   No results found for this or any previous visit (from the past 240 hour(s)).    ROS - per HPI     has a past medical history of Anxiety, Chronic kidney disease, DDD (degenerative disc disease), Diabetes mellitus, Fibromyalgia, GERD (gastroesophageal reflux disease), Gout, High  cholesterol, Hyperglycemia, Hypertension, Shortness of breath dyspnea, and Sleep apnea.   reports that he quit smoking about 12 years ago. His smoking use included cigarettes. He has a 50.00 pack-year smoking history. He has quit using smokeless tobacco.  His smokeless tobacco use included chew.  Past Surgical History:  Procedure Laterality Date  . BACK SURGERY     total of 3 surgeries  . back surrgery     2013  . CARDIAC CATHETERIZATION     01/2015  . EYE SURGERY     bilateral cataracts  . HERNIA REPAIR     upper ventral hernia (had as child)  . LEFT HEART CATHETERIZATION WITH CORONARY ANGIOGRAM N/A 01/11/2015   Procedure: LEFT HEART CATHETERIZATION WITH CORONARY ANGIOGRAM;  Surgeon: Adrian Prows, MD;  Location: [facility] CATH LAB;  Service: Cardiovascular;  Laterality: N/A;  . LITHOTRIPSY    . lumbar discetomy     1977  . LUNG BIOPSY Left 04/13/2015   Procedure: LUNG BIOPSY;  Surgeon: [name], MD;  Location: [facility];  Service: Thoracic;  Laterality: Left;  . removal of kidney stone     thru surgery  . rotator cuff repair     Dr. Durward Fortes  . VIDEO ASSISTED THORACOSCOPY Left 04/13/2015   Procedure: VIDEO ASSISTED THORACOSCOPY;  Surgeon: [name],  MD;  Location: [facility];  Service: Thoracic;  Laterality: Left;  Shawn Cruz VIDEO BRONCHOSCOPY N/A 04/13/2015   Procedure: VIDEO BRONCHOSCOPY;  Surgeon: [name], MD;  Location: [facility] OR;  Service: Thoracic;  Laterality: N/A;    Allergies  Allergen Reactions  . Ofev [Nintedanib] Diarrhea  . Sulfonamide Derivatives     REACTION: Age 41, leg swelling    Immunization History  Administered Date(s) Administered  . Influenza Split 07/06/2014  . Influenza Whole 07/26/2011  . Influenza, High Dose Seasonal PF 07/15/2017, 07/23/2018  . Influenza,inj,Quad PF,6+ Mos 08/04/2013  . Influenza-Unspecified 07/22/2015  . Pneumococcal Conjugate-13 08/05/2014  . Pneumococcal Polysaccharide-23 08/22/2012, 07/20/2016    Family History  Problem Relation  Age of Onset  . Hypertension Mother   . Heart attack Mother        age 84's  . Hypertension Father   . Heart attack Father        age 75's  . Aortic aneurysm Sister      Current Outpatient Medications:  .  allopurinol (ZYLOPRIM) 300 MG tablet, Take 300 mg by mouth daily., Disp: , Rfl:  .  aspirin EC 81 MG tablet, Take 81 mg by mouth daily., Disp: , Rfl:  .  atorvastatin (LIPITOR) 10 MG tablet, Take 10 mg by mouth daily at 6 PM. , Disp: , Rfl:  .  chlorpheniramine (CHLOR-TRIMETON) 4 MG tablet, Take 4 mg by mouth 2 (two) times daily as needed for allergies., Disp: , Rfl:  .  Cholecalciferol (VITAMIN D) 2000 UNITS CAPS, Take 5,000 Units by mouth daily. , Disp: , Rfl:  .  citalopram (CELEXA) 40 MG tablet, Take 1 tablet by mouth daily., Disp: , Rfl: 6 .  cyanocobalamin 100 MCG tablet, Take 100 mcg by mouth daily., Disp: , Rfl:  .  ESBRIET 801 MG TABS, TAKE 1 TABLET BY MOUTH THREE TIMES DAILY WITH FOOD, Disp: 90 tablet, Rfl: 11 .  furosemide (LASIX) 40 MG tablet, TAKE 1 AND 1/2 TABLETS BY MOUTH EVERY MORNING AND 1 TABLET EVERY EVENING, Disp: 225 tablet, Rfl: 0 .  HYDROcodone-acetaminophen (NORCO) 10-325 MG per tablet, Take 1 tablet by mouth every 6 (six) hours as needed., Disp: , Rfl:  .  Loperamide HCl (IMODIUM PO), Take by mouth., Disp: , Rfl:  .  ondansetron (ZOFRAN) 4 MG tablet, Take 1-2 tablets prn daily, Disp: 30 tablet, Rfl: 0 .  ondansetron (ZOFRAN) 8 MG tablet, Take 1 tablet (8 mg total) by mouth daily as needed for nausea or vomiting., Disp: 30 tablet, Rfl: 1 .  potassium chloride (KLOR-CON) 20 MEQ packet, Take 20 mEq by mouth daily., Disp: 7 tablet, Rfl: 0      Objective:   Vitals:   08/05/18 1032 08/05/18 1036  BP: 116/64   Pulse: 87 79  SpO2: (!) 78% 94%  Weight: 214 lb 12.8 oz (97.4 kg)   Height: 5' 7.5" (1.715 m)    78% RA 94% on 4 LN C Estimated body mass index is 33.15 kg/m as calculated from the following:   Height as of this encounter: 5' 7.5" (1.715 m).    Weight as of this encounter: 214 lb 12.8 oz (97.4 kg).  @WEIGHTCHANGE @  [employer]   08/05/18 1032  Weight: 214 lb 12.8 oz (97.4 kg)     Physical Exam  General Appearance:    Alert, cooperative, no distress, appears stated age - yes , Deconditioned looking - yes , OBESE  - yes, Sitting on Wheelchair -  yes  Head:  Normocephalic, without obvious abnormality, atraumatic  Eyes:    PERRL, conjunctiva/corneas clear,  Ears:    Normal TM's and external ear canals, both ears  Nose:   Nares normal, septum midline, mucosa normal, no drainage    or sinus tenderness. OXYGEN ON  - yes . Patient is @ 4L [place] baseline   Throat:   Lips, mucosa, and tongue normal; teeth and gums normal. Cyanosis on lips - no  Neck:   Supple, symmetrical, trachea midline, no adenopathy;    thyroid:  no enlargement/tenderness/nodules; no carotid   bruit or JVD  Back:     Symmetric, no curvature, ROM normal, no CVA tenderness  Lungs:     Distress - no , Wheeze no, Barrell Chest - n, Purse lip breathing - no, Crackles - yes baseline   Chest Wall:    No tenderness or deformity.    Heart:    Regular rate and rhythm, S1 and S2 normal, no rub   or gallop, Murmur - no  Breast Exam:    NOT DONE  Abdomen:     Soft, non-tender, bowel sounds active all four quadrants,    no masses, no organomegaly. Visceral obesity - yes  Genitalia:   NOT DONE  Rectal:   NOT DONE  Extremities:   Extremities - normal, Has Cane - yes, Clubbing - yes, Edema - ++, Chronic, bialteral cavles warm, scaling and discolored shin. Both feet have purplish hue  Pulses:   2+ and symmetric all extremities - R DP > L DP  Skin:   Stigmata of Connective Tissue Disease - no  Lymph nodes:   Cervical, supraclavicular, and axillary nodes normal  Psychiatric:  Neurologic:   Pleasant - yes, Anxious - no, Flat affect - no  CAm-ICU - neg, Alert and Oriented x 3 - yes, Moves all 4s - yes, Speech - normal, Cognition - intact           Assessment:        ICD-10-CM   1. Cellulitis of lower extremity, unspecified laterality L03.119   2. Discoloration of skin of multiple sites of lower extremity L81.9   3. IPF (idiopathic pulmonary fibrosis) ([place]) J84.112   4. Chronic hypoxemic respiratory failure (HCC) J96.11   5. Encounter for therapeutic drug monitoring Z51.81        Plan:     Patient Instructions  Cellulitis of lower extremity, unspecified laterality Discoloration of skin of multiple sites of lower extremity  - cephalexin 500mg  four  times daily x  7 days - pressur stockings - lasix at 60mg  twice daily - refer Vascular [facility]ice consultation due to intense discoloration of feet and coolness though can feel pulses   IPF (idiopathic pulmonary fibrosis) (HCC) Chronic hypoxemic respiratory failure (HCC) Encounter for therapeutic drug monitoring  - seems stable - change upcoming IPF Followup to 4 weeks from now - spirometry/dlco in 4 weeks  Followup ILD clinic in 4 weeks     SIGNATURE    Dr. Brand Males, M.D., F.C.C.P,  Pulmonary and Critical Care Medicine Staff Physician, [place] Director - Interstitial Lung Disease  Program  Pulmonary [place] [facility] [place], [place], [address]  Pager: [phone], If no answer or between  15:00h - 7:00h: call [phone] Telephone: [phone]  11:05 AM 08/05/2018

## 2018-08-11 DIAGNOSIS — J849 Interstitial pulmonary disease, unspecified: Secondary | ICD-10-CM | POA: Diagnosis not present

## 2018-08-11 DIAGNOSIS — M4716 Other spondylosis with myelopathy, lumbar region: Secondary | ICD-10-CM | POA: Diagnosis not present

## 2018-08-12 ENCOUNTER — Ambulatory Visit: Payer: Self-pay | Admitting: Internal Medicine

## 2018-08-19 DIAGNOSIS — M47816 Spondylosis without myelopathy or radiculopathy, lumbar region: Secondary | ICD-10-CM | POA: Diagnosis not present

## 2018-08-20 ENCOUNTER — Telehealth: Payer: Self-pay | Admitting: Internal Medicine

## 2018-08-20 ENCOUNTER — Other Ambulatory Visit: Payer: Self-pay

## 2018-08-20 DIAGNOSIS — L03119 Cellulitis of unspecified part of limb: Principal | ICD-10-CM

## 2018-08-20 DIAGNOSIS — L0390 Cellulitis, unspecified: Secondary | ICD-10-CM

## 2018-08-20 DIAGNOSIS — L02419 Cutaneous abscess of limb, unspecified: Secondary | ICD-10-CM

## 2018-08-20 NOTE — Telephone Encounter (Signed)
Called pt and spoke with Shawn Cruz letting her know the information stated per MR. Shawn Cruz stated she would try to call to see if they could get an earlier appt than 11/25.  Shawn Cruz expressed understanding.  I called [facility] at [phone] and spoke with [name] to see if pt could be seen for a sooner appt. [name] stated she would see if anyone could get pt in for a sooner appt than 11/25 knowing pt is a new pt with them.  Per [name], they can change pt's appt to 11/11 at 10am.  I put [name] on hold and called pt to see if he could do that date and pt stated he could.  Stated to [place] we could change pt's appt. [name] expressed understanding and stated she would change it. Nothing further needed.

## 2018-08-20 NOTE — Telephone Encounter (Signed)
I really do not know what to do. Seen him few times for same. I am puzzled. Can they talk to PCP System, Pcp Not In - who is PCP? OR can you call [facility] and see if they will see patient sooner?

## 2018-08-20 NOTE — Telephone Encounter (Signed)
Called and spoke with pt's wife Shawn Cruz who stated pt has red and purple from his knee to his feet, legs are swollen and wants to know if anything can be prescribed to help with this.  I asked Shawn Cruz if pt has had the consult with [facility] and per Shawn Cruz, pt has not had the consult yet.  Pt's appt is not until 09/29/18 with Dr. Trula Slade  MR, please advise on this for pt and wife Shawn Cruz. Thanks!

## 2018-09-10 ENCOUNTER — Telehealth: Payer: Self-pay | Admitting: Internal Medicine

## 2018-09-10 NOTE — Telephone Encounter (Signed)
Called and spoke with patient, he is needing to reschedule appointments. Appointments rescheduled. Nothing further needed.

## 2018-09-11 DIAGNOSIS — J849 Interstitial pulmonary disease, unspecified: Secondary | ICD-10-CM | POA: Diagnosis not present

## 2018-09-11 DIAGNOSIS — M4716 Other spondylosis with myelopathy, lumbar region: Secondary | ICD-10-CM | POA: Diagnosis not present

## 2018-09-15 ENCOUNTER — Ambulatory Visit: Payer: Medicare Other | Admitting: Surgery

## 2018-09-15 ENCOUNTER — Other Ambulatory Visit: Payer: Self-pay

## 2018-09-15 ENCOUNTER — Ambulatory Visit (HOSPITAL_COMMUNITY)
Admission: RE | Admit: 2018-09-15 | Discharge: 2018-09-15 | Disposition: A | Discharge status: Home / Self Care | Payer: Medicare Other | Source: Ambulatory Visit | Attending: Surgery | Admitting: Surgery

## 2018-09-15 ENCOUNTER — Encounter: Payer: Self-pay | Admitting: Surgery

## 2018-09-15 VITALS — BP 122/82 | HR 88 | Temp 97.1°F | Resp 18 | Ht 68.0 in | Wt 205.0 lb

## 2018-09-15 DIAGNOSIS — L0390 Cellulitis, unspecified: Secondary | ICD-10-CM | POA: Diagnosis not present

## 2018-09-15 NOTE — Progress Notes (Signed)
[facility] [place]  Patient name: Shawn Cruz MRN: [id number] DOB: Feb 18, 1941 Sex: male   REQUESTING PROVIDER:    Dr. Lynford Citizen   REASON FOR CONSULT:    Leg cellulitis  HISTORY OF PRESENT ILLNESS:   Shawn Cruz is a 77 y.o. 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  He is on 3 L of oxygen at rest.  He is being referred for evaluation of cellulitis in his legs.  He has been on antibiotics recently.  He states that he started having discoloration in his legs about 1 year ago.  He also notes that his toes have turned blue.  He does not endorse any symptoms of claudication, however his walking is severely limited by his pulmonary status.  He does not endorse any pain in his legs or nonhealing wounds.  Patient suffers from chronic renal insufficiency.  His most recent creatinine is 1.74.  He is medically managed for hypercholesterolemia with a statin.  He is a former smoker.  PAST MEDICAL HISTORY    Past Medical History:  Diagnosis Date  . Anxiety   . Chronic kidney disease    has kidney stones  "galore"  Last attack was 8-10 yrs ago  . DDD (degenerative disc disease)    LS spine  . Diabetes mellitus    diet controlled  takes no meds  . Fibromyalgia   . GERD (gastroesophageal reflux disease)    takes tums occasionally for indigestion  . Gout   . High cholesterol   . Hyperglycemia   . Hypertension    history of....not taking anything now  . Shortness of breath dyspnea   . Sleep apnea    tested "yrs ago" ...unable to tolerate mask....PCP [facility] is aware     FAMILY HISTORY   Family History  Problem Relation Age of Onset  . Hypertension Mother   . Heart attack Mother        age 37's  . Hypertension Father   . Heart attack Father        age 21's  . Aortic aneurysm Sister     SOCIAL HISTORY:   Social History   Socioeconomic History  . Marital  status: Married    Spouse name: Not on file  . Number of children: Not on file  . Years of education: Not on file  . Highest education level: Not on file  Occupational History  . Occupation: retired  Scientific laboratory technician  . Financial resource strain: Not on file  . Food insecurity:    Worry: Not on file    Inability: Not on file  . Transportation needs:    Medical: Not on file    Non-medical: Not on file  Tobacco Use  . Smoking status: Former Smoker    Packs/day: 1.00    Years: 50.00    Pack years: 50.00    Types: Cigarettes    Last attempt to quit: 11/05/2005    Years since quitting: 12.8  . Smokeless tobacco: Former Systems developer    Types: Chew  . Tobacco comment: Quit at age 43  Substance and Sexual Activity  . Alcohol use: Yes    Alcohol/week: 1.0 standard drinks    Types: 1 Cans of beer per week    Comment: occass  . Drug use: No  . Sexual activity: Not on file  Lifestyle  . Physical activity:    Days per week: Not on file    Minutes per session:  Not on file  . Stress: Not on file  Relationships  . Social connections:    Talks on phone: Not on file    Gets together: Not on file    Attends religious service: Not on file    Active member of club or organization: Not on file    Attends meetings of clubs or organizations: Not on file    Relationship status: Not on file  . Intimate partner violence:    Fear of current or ex partner: Not on file    Emotionally abused: Not on file    Physically abused: Not on file    Forced sexual activity: Not on file  Other Topics Concern  . Not on file  Social History Narrative  . Not on file    ALLERGIES:    Allergies  Allergen Reactions  . Sulfa Antibiotics Other (See Comments)    REACTION: Age 74, leg swelling REACTION: Age 85, leg swelling Childhood allergy  . Ofev [Nintedanib] Diarrhea  . Sulfonamide Derivatives     REACTION: Age 85, leg swelling    CURRENT MEDICATIONS:    Current Outpatient Medications  Medication Sig  Dispense Refill  . allopurinol (ZYLOPRIM) 300 MG tablet Take 300 mg by mouth daily.    [name] aspirin EC 81 MG tablet Take 81 mg by mouth daily.    [name] atorvastatin (LIPITOR) 10 MG tablet Take 10 mg by mouth daily at 6 PM.     . cephALEXin (KEFLEX) 500 MG capsule Take 1 capsule (500 mg total) by mouth 4 (four) times daily. 28 capsule 0  . chlorpheniramine (CHLOR-TRIMETON) 4 MG tablet Take 4 mg by mouth 2 (two) times daily as needed for allergies.    . Cholecalciferol (VITAMIN D) 2000 UNITS CAPS Take 5,000 Units by mouth daily.     . citalopram (CELEXA) 40 MG tablet Take 1 tablet by mouth daily.  6  . cyanocobalamin 100 MCG tablet Take 100 mcg by mouth daily.    . ESBRIET 801 MG TABS TAKE 1 TABLET BY MOUTH THREE TIMES DAILY WITH FOOD 90 tablet 11  . furosemide (LASIX) 40 MG tablet TAKE 1 AND 1/2 TABLETS BY MOUTH EVERY MORNING AND 1 TABLET EVERY EVENING 225 tablet 0  . HYDROcodone-acetaminophen (NORCO) 10-325 MG per tablet Take 1 tablet by mouth every 6 (six) hours as needed.    . Loperamide HCl (IMODIUM PO) Take by mouth.    . ondansetron (ZOFRAN) 4 MG tablet Take 1-2 tablets prn daily 30 tablet 0  . ondansetron (ZOFRAN) 8 MG tablet Take 1 tablet (8 mg total) by mouth daily as needed for nausea or vomiting. 30 tablet 1  . potassium chloride (KLOR-CON) 20 MEQ packet Take 20 mEq by mouth daily. 7 tablet 0   No current facility-administered medications for this visit.     REVIEW OF SYSTEMS:   [X]  denotes positive finding, [ ]  denotes negative finding Cardiac  Comments:  Chest pain or chest pressure:    Shortness of breath upon exertion: x   Short of breath when lying flat: x   Irregular heart rhythm:        Vascular    Pain in calf, thigh, or hip brought on by ambulation:    Pain in feet at night that wakes you up from your sleep:  x   Blood clot in your veins:    Leg swelling:  x       Pulmonary    Oxygen at home: x   Productive  cough:     Wheezing:  x       Neurologic    Sudden  weakness in arms or legs:  x   Sudden numbness in arms or legs:  x   Sudden onset of difficulty speaking or slurred speech:    Temporary loss of vision in one eye:     Problems with dizziness:  x       Gastrointestinal    Blood in stool:      Vomited blood:         Genitourinary    Burning when urinating:     Blood in urine:        Psychiatric    Major depression:         Hematologic    Bleeding problems:    Problems with blood clotting too easily:        Skin    Rashes or ulcers:        Constitutional    Fever or chills:     PHYSICAL EXAM:   Vitals:   09/15/18 1101  BP: 122/82  Pulse: 88  Resp: 18  Temp: (!) 97.1 F (36.2 C)  TempSrc: Oral  SpO2: 94%  Weight: 205 lb (93 kg)  Height: 5\' 8"  (1.727 m)    GENERAL: The patient is a well-nourished male, in no acute distress. The vital signs are documented above. CARDIAC: There is a regular rate and rhythm.  VASCULAR: I could not palpate his pedal pulses, however he had multiphasic posterior tibial Doppler signals.  There is 1+ edema bilaterally PULMONARY: Nonlabored respirations ABDOMEN: Soft and non-tender with normal pitched bowel sounds.  MUSCULOSKELETAL: There are no major deformities or cyanosis. NEUROLOGIC: No focal weakness or paresthesias are detected. SKIN: Nonblanching erythema to both lower extremity. PSYCHIATRIC: The patient has a normal affect.      STUDIES:   I have ordered and reviewed his vascular studies with the following findings: Right: Abnormal reflux times were noted in the common femoral vein, great saphenous vein at the saphenofemoral junction, great saphenous vein at the proximal thigh, great saphenous vein at the mid thigh, and great saphenous vein at the distal thigh.  There is no evidence of deep vein thrombosis in the lower extremity in the CFV, FV, and POPV.There is no evidence of superficial venous thrombosis in the GSV and SSV. Left: There is no evidence of deep vein thrombosis  in the lower extremity in the CFV, FV, and POPV.There is no evidence of superficial venous thrombosis in the GSV and SSV. ASSESSMENT and PLAN   Bilateral leg cellulitis: I do not feel that the patient has active cellulitis currently.  I do feel that he has adequate arterial circulation to his legs.  His ultrasound today was positive for venous reflux in the right leg.  Based on the size of the saphenous vein, he is not a candidate for intervention.  I discussed with the patient and his wife that I feel the most important thing for him is to keep his legs elevated and to wear compression stockings when possible.  As we head into the cooler months of winter, he should try to keep his feet as warm as possible.  I also think he would benefit from a moisturizing skin lotion as his skin appears to be very dry.  If these areas do not resolve, potential referral to dermatology could be made for consideration of skin biopsy.   Annamarie Major, MD [facility] [phone] Pager [phone]

## 2018-09-16 ENCOUNTER — Ambulatory Visit: Payer: Self-pay | Admitting: Internal Medicine

## 2018-09-17 ENCOUNTER — Telehealth: Payer: Self-pay | Admitting: Internal Medicine

## 2018-09-17 MED ORDER — FUROSEMIDE 40 MG PO TABS: ORAL_TABLET | ORAL | 0 refills | Status: DC

## 2018-09-17 NOTE — Telephone Encounter (Signed)
Called and spoke with patient, he stated that he is now taking this medication 5 times a day due to problems he is having. Patient would not go into detail about problems. He stated that the original prescriber will not refill this medication and he would like MR to.   MR please advise, thank you.

## 2018-09-17 NOTE — Telephone Encounter (Signed)
Ok to refill. I can address at dec visit.

## 2018-09-17 NOTE — Telephone Encounter (Signed)
Called patient unable to reach left message to give us a call back.

## 2018-09-17 NOTE — Telephone Encounter (Signed)
Called patient unable to reach left message to give [place] a call back. Refill sent to pharmacy

## 2018-09-18 ENCOUNTER — Other Ambulatory Visit: Payer: Self-pay | Admitting: Internal Medicine

## 2018-09-18 MED ORDER — FUROSEMIDE 20 MG PO TABS: ORAL_TABLET | ORAL | 1 refills | Status: DC

## 2018-09-18 NOTE — Telephone Encounter (Signed)
Attempted to call pt but no answer. Left message for pt to return call. 

## 2018-09-18 NOTE — Telephone Encounter (Signed)
Called and spoke to patient, patient states his lasix should be Lasix 60mg  twice daily. Requesting a refill be sent in. Per Last AVS from MR patient should be taking Lasix 60mg  BID. Call made to [facility] [address], spoke with Pharmacist made aware order should state 60mg  daily and D/C current order. New order sent. Called and made patient aware new order has been sent. Nothing further is needed at this time.

## 2018-09-18 NOTE — Telephone Encounter (Signed)
Pt is calling back [phone]

## 2018-09-22 ENCOUNTER — Ambulatory Visit (INDEPENDENT_AMBULATORY_CARE_PROVIDER_SITE_OTHER): Payer: Medicare Other | Admitting: Internal Medicine

## 2018-09-22 DIAGNOSIS — J9611 Chronic respiratory failure with hypoxia: Secondary | ICD-10-CM | POA: Diagnosis not present

## 2018-09-22 DIAGNOSIS — J84112 Idiopathic pulmonary fibrosis: Secondary | ICD-10-CM

## 2018-09-22 LAB — PULMONARY FUNCTION TEST
DL/VA % pred: 68 %
DL/VA: 3.04 ml/min/mmHg/L
DLCO COR % PRED: 37 %
DLCO UNC: 10.44 ml/min/mmHg
DLCO cor: 10.86 ml/min/mmHg
DLCO unc % pred: 36 %
FEF 25-75 Pre: 1.52 L/sec
FEF2575-%Pred-Pre: 76 %
FEV1-%Pred-Pre: 53 %
FEV1-PRE: 1.47 L
FEV1FVC-%Pred-Pre: 110 %
FEV6-%Pred-Pre: 51 %
FEV6-Pre: 1.83 L
FEV6FVC-%Pred-Pre: 107 %
FVC-%Pred-Pre: 47 %
FVC-Pre: 1.83 L
PRE FEV1/FVC RATIO: 80 %
Pre FEV6/FVC Ratio: 100 %

## 2018-09-22 NOTE — Progress Notes (Signed)
Patient completed pre spiro and dlco.

## 2018-09-29 ENCOUNTER — Encounter: Payer: Self-pay | Admitting: Surgery

## 2018-09-29 ENCOUNTER — Encounter (HOSPITAL_COMMUNITY): Payer: Self-pay

## 2018-10-11 DIAGNOSIS — M4716 Other spondylosis with myelopathy, lumbar region: Secondary | ICD-10-CM | POA: Diagnosis not present

## 2018-10-11 DIAGNOSIS — J849 Interstitial pulmonary disease, unspecified: Secondary | ICD-10-CM | POA: Diagnosis not present

## 2018-10-13 ENCOUNTER — Other Ambulatory Visit: Payer: Self-pay | Admitting: Internal Medicine

## 2018-10-14 ENCOUNTER — Encounter: Payer: Self-pay | Admitting: Internal Medicine

## 2018-10-14 ENCOUNTER — Ambulatory Visit: Payer: Medicare Other | Admitting: Internal Medicine

## 2018-10-14 VITALS — BP 122/78 | HR 89 | Ht 68.0 in | Wt 211.8 lb

## 2018-10-14 DIAGNOSIS — Z5181 Encounter for therapeutic drug level monitoring: Secondary | ICD-10-CM

## 2018-10-14 DIAGNOSIS — N189 Chronic kidney disease, unspecified: Secondary | ICD-10-CM

## 2018-10-14 DIAGNOSIS — J9611 Chronic respiratory failure with hypoxia: Secondary | ICD-10-CM

## 2018-10-14 DIAGNOSIS — J84112 Idiopathic pulmonary fibrosis: Secondary | ICD-10-CM

## 2018-10-14 DIAGNOSIS — R634 Abnormal weight loss: Secondary | ICD-10-CM

## 2018-10-14 DIAGNOSIS — T50905A Adverse effect of unspecified drugs, medicaments and biological substances, initial encounter: Secondary | ICD-10-CM

## 2018-10-14 LAB — CBC WITH DIFFERENTIAL/PLATELET
Basophils Absolute: 0.1 10*3/uL (ref 0.0–0.1)
Basophils Relative: 0.9 % (ref 0.0–3.0)
Eosinophils Absolute: 0.5 10*3/uL (ref 0.0–0.7)
Eosinophils Relative: 6.4 % — ABNORMAL HIGH (ref 0.0–5.0)
HCT: 40.8 % (ref 39.0–52.0)
Hemoglobin: 13.8 g/dL (ref 13.0–17.0)
LYMPHS ABS: 1.2 10*3/uL (ref 0.7–4.0)
Lymphocytes Relative: 15.5 % (ref 12.0–46.0)
MCHC: 33.9 g/dL (ref 30.0–36.0)
MCV: 94.3 fl (ref 78.0–100.0)
MONO ABS: 0.9 10*3/uL (ref 0.1–1.0)
Monocytes Relative: 12 % (ref 3.0–12.0)
Neutro Abs: 5 10*3/uL (ref 1.4–7.7)
Neutrophils Relative %: 65.2 % (ref 43.0–77.0)
PLATELETS: 226 10*3/uL (ref 150.0–400.0)
RBC: 4.32 Mil/uL (ref 4.22–5.81)
RDW: 14.6 % (ref 11.5–15.5)
WBC: 7.7 10*3/uL (ref 4.0–10.5)

## 2018-10-14 LAB — BASIC METABOLIC PANEL
BUN: 38 mg/dL — ABNORMAL HIGH (ref 6–23)
CO2: 35 mEq/L — ABNORMAL HIGH (ref 19–32)
CREATININE: 1.58 mg/dL — AB (ref 0.40–1.50)
Calcium: 10.1 mg/dL (ref 8.4–10.5)
Chloride: 96 mEq/L (ref 96–112)
GFR: 45.55 mL/min — ABNORMAL LOW (ref >60.00)
GLUCOSE: 96 mg/dL (ref 70–99)
Potassium: 3.1 mEq/L — ABNORMAL LOW (ref 3.5–5.1)
Sodium: 139 mEq/L (ref 135–145)

## 2018-10-14 LAB — HEPATIC FUNCTION PANEL
ALT: 16 U/L (ref 0–53)
AST: 25 U/L (ref 0–37)
Albumin: 4 g/dL (ref 3.5–5.2)
Alkaline Phosphatase: 69 U/L (ref 39–117)
Bilirubin, Direct: 0.1 mg/dL (ref 0.0–0.3)
Total Bilirubin: 0.4 mg/dL (ref 0.2–1.2)
Total Protein: 7.8 g/dL (ref 6.0–8.3)

## 2018-10-14 NOTE — Patient Instructions (Addendum)
Chronic hypoxemic respiratory failure (HCC) IPF (idiopathic pulmonary fibrosis) ([place]) Encounter for therapeutic drug monitoring   - getting worse  - do echo with Dr Einar Gip - my office to re-refer to ensure not heart issue  - continue o2 and esbriet full dose as before  -  Do cbc, bmet, lft - refer home palliative (not hospice) - next visit discuss goals esp hospice benefit   Chronic kidney disease, unspecified CKD stage - slowly getting worse  - do bmet  Drug-induced weight loss - probably from esbriet - address at followup depending on blood test esp creatinine  Followup 4 weeks with Dr Chase Caller in 30 min slot - ILD or regular clinic

## 2018-10-14 NOTE — Progress Notes (Signed)
5/31/2017Follow Up Appointment: Pt. Presents to the office for follow up. He feels he is more short of breath, especially with exertion. He also states he is fatigued easily.[name] He is taking 200 mg of Ofab daily. He is tolerating the 200 mg dose well. He was not tolerating the 300 mg dose.There has been a decline in PFT's over the last year of about 15%. We will discuss changing medication from Ofev to [place].   Tests: PFT's 04/04/2016:  FVC: 2.18/ 56% Decline over last year which was 2.34/59%   OV 05/23/2016  Chief Complaint  Patient presents with  . Follow-up    pt states his SOB is stable- has been on esbriet X30 days, only side effect noticed is that increased fatigue and L foot swelling.  Pt has noticed swelling X2 wks.      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 Overall he is tolerating it fine. He wears sunscreen when he goes out for a prolonged period of time. No skin issues. He is having some fatigue. There no GI side effects. He is also noticing some worsening ankle edema particularly on the left side associated with foot edema in the last few weeks. He is wondering if it's related to Pirfenidone (Esbriet). He is on Lasix for chronic venous insufficiency and pedal edema at 40 mg twice a day. This no fever or chills or nausea vomiting. The fatigue is slightly worse than baseline. It is moderate in severity. He has never wanted her to pulmonary rehabilitation due to cost. In terms of dyspnea this is stable   OV 06/27/2016  Chief Complaint  Patient presents with  . Idiopathic Pulmonary Fibrosis    Breathing is worse since last OV. Reports increased SOB, dry cough. Denies chest tightness, wheezing.    FU IPF with chronic hypoxemic resp fialure pn esbeit since June 2017  Tolerating esbriet fine. No skin issues. Having GERD per wife though he denied it. Relieved by TUMS. Not  taking PPI; wife feels he needs to be on PPI. Overall feels dyspnea is worse but denies chest pain or edema. Stil uses 3L [place] and does not desaturate below 89% on self monitoring.      OV 10/01/2016   Chief Complaint  Patient presents with  . Follow-up    for PFT results. Pt. states he is breathing about the same. Wife states the opposite. Breathing is worse.    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 Fine. No skin issues. However wife begins to think that he is beginning to have low appetite and some weight loss. However he says because of his obesity visit this is a good thing for him. He tells me that he is stable. This 3 L of oxygen at rest he is able to do his ADLs and only very rarely desaturated to 86%. He says this is stable compared to his last visit in Aug  ust 2017. Off note he is yet to get his Pirfenidone (Esbriet) at one pill 3 times a day which is the 3 pills rolled and 1 pill. . Wife thinks he is beginning to decline because at night she feels that he is a bit more tachypneic. Pulmonary function test done this visit show stability compared to May 2017. It appears that he is stable and starting Pirfenidone (Esbriet). His wife is wondering about transplant  eligibility      OV 03/22/2017  Chief Complaint  Patient presents with  . 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 There are no skin issues. He is tolerating Pirfenidone (Esbriet) just fine except for mild fatigue and some taste issues. But he definitely feels that the Pirfenidone (Esbriet) is better tolerated than the Ofev. He also feels that the aspirate has halted progression of disease. Otherwise no new issues. Spirometry and DLCO shows relative  stability in the last 1 year.   OV 07/23/2017  Chief Complaint  Patient presents with  . Follow-up    PFT today. Pt states that he has had some issues with his SOB due to the climate, occ. cough. Denies any CP. DME: [facility], 31L O64.   77 year old male with idiopathic pulmonary fibrosis and chronic hypoxemic respiratory failure. Presents with wife Shawn Cruz. He tells me that over time in the last 1 year he feels more short of breath but still has a good functional quality of life. He is stable on Pirfenidone (Esbriet). Able to tolerated well. Is due for liver function test today. Interstitial lung disease symptom score shows significant symptom burden. Also lung function shows a 10% decline in FVC and DLCO in the last 1 year. At this point in time he wants a lighter portable oxygen system. Also of note he asked if I could refill his pain medications because his neurosurgeon Dr. Glenna Cruz retiring. I defer this to primary care physician or pain clinic or to the neurosurgery practice   OV 03/04/2018  Chief Complaint  Patient presents with  . Follow-up    PFT done today.  Pt states his breathing has become worse and has a mild cough. Per pt's wife, pt has no energy, little appetite, and is losing strength. Pt is still taking Esbriet and only complaint is it has been messing with his appetite. Denies any CP.    Follow-up idiopathic pulmonary fibrosis . DX April 13, 2015 via biopsy [facility]. STopped ofev spring 2017 due to diarrhea/intolerance. On esbrit since June 2017. Also with chronic hypoxemic respiratory failure on 3 L of oxygen   Mr. Shawn Cruz presents with his wife. He tells me in the last 3 months he feels more short of breath than usual. He thinks he might be desaturating when he walks on 3 L of oxygen to the bathroom and back although this is not properly documented t home. He remains on 3 L.  He remains on esbriet. Does have a constellation of mild nausea and moderate amount of fatigue and some20 pound  weight loss in the last few years. He feels this is because of Pirfenidone (Esbriet).   Walking desaturation test on 03/04/2018 185 feet x 3 laps on  3L [place]   - did only  2 laps and slow and stopped due to severe dyspnea. did *NOTdesaturate. Rest pulse ox was 99%, final pulse ox was 93%. HR response 86/min at rest to 104/min at peak exertion. Patient Shawn Cruz  Did not Desaturate < 88% . Shawn Cruz did yes  Desaturated </= 3% points. Shawn Cruz did not get tachyardic  PFT show stability since Sept 2018  LAb revewi shows mild decrease in GFR jan 2019    OV 07/10/2018  Subjective:  Patient ID: Shawn Cruz,  male , DOB: 1940-12-24 , age 53 y.o. , MRN: [id number] , ADDRESS: [place] [id number]   07/10/2018 -   Chief Complaint  Patient presents with  . Follow-up    CT performed 8/13.  Pt states when he had the CT scan performed as well as ABG, he was having a hard time breathing. Pt states his breathing has been stable since was put on steroids. Pt is also coughing which is nonproductive. Denies any CP.    Follow-up idiopathic pulmonary fibrosis . DX April 13, 2015 via biopsy [facility]. STopped ofev spring 2017 due to diarrhea/intolerance. On esbrit since June 2017. Also with chronic hypoxemic respiratory failure on 3 L of oxygen    HPI Shawn Cruz 77 y.o. - presents for follow-up of his IPF.last visit end of April 2019. At that point we reduced his Pirfenidone (Esbriet)  To two third of normal dose. He takes one large pill 2 times a day. He says he has no side effects. Review of the chart indicates a rational for reducing the dose was mild chronic renal insufficiency. At this point in time he says he tolerates the Pirfenidone (Esbriet) quite fine. Recently had a flareup and he got a shot on prednisone which helped him but he is back to baseline. His wife Mrs. [name] is frustrated that he is excessively fatigued and sleepy during the daytime. There is  also lack of motivation which the patient admits. He is always refused to go to rehabilitation in the past. The wife is asking abouttesting for sleep apnea but the patient refusedsaying that he will never wear his CPAP because he cannot tolerate it. Wife then asked for chronic daily prednisone we discussed the side effects and the risks and chronic IPF management be on the situation of a flareup or treatment of severe cough. Based on this patient is not inclined.We discussed potential pulmonary rehabilitation but he refused again. However he is open to physical therapy at home. He is also open to increasing the dose of the Pirfenidone (Esbriet) a full dose.he is also agreeable to makingfollow-up every 6-12 weeks.    - per HPI   OV 08/05/2018  Subjective:  Patient ID: Shawn Cruz, male , DOB: [birth year] , age 46 y.o. , MRN: [id number] , ADDRESS: [place] [id number]   08/05/2018 -   Chief Complaint  Patient presents with  . Acute Home Visit    Pt has had increased leg swelling and SOB. Pt's feet and legs are both red and purple which keeps getting worse. Pt states SOB is about the same as last visit.     HPI Shawn Cruz 77 y.o. -IPF patient.  He supposed to come and see me for routine IPF follow-up in the next few days.  However he called in acutely yesterday and his wife also called in.  Therefore he has been brought in acutely.  He says his pulmonary status on 4 L of oxygen and his current intake is stable.  However he has had worsening pedal edema.  For the last several weeks he has been sleeping in a chair.  According to him he is actually been sleeping in a chair for the last few months.  And in the last week or few weeks there is been a purplish discoloration of both feet.  He denies being a diabetic.  There is no wound ulcers.  There is no trauma.  There is no fever or blister.  His calves are warm according to his history.  There is also scaling of the skin.  He  is on Lasix 60 mg in the morning and 40 mg at night.  He has done blood work today and the results are pending.   OV 10/14/2018  Subjective:  Patient ID: Shawn Cruz, male , DOB: [birth year] , age 30 y.o. , MRN: [id number] , ADDRESS: [place] [id number]   10/14/2018 -   Chief Complaint  Patient presents with  . Follow-up    pt states breathing has worsen since last ov. pt reports of increased sob with exertion, occ dry cough &  occ wheezing     HPI Shawn Cruz 77 y.o. -IPF with chronic hypoxemic respiratory failure follow-up associated with chronic kidney disease and also cor pulmonale with chronic venous stasis edema on Lasix therapy  Presents with his wife [name].  Saw me last 2 months ago in October 2019.  Since then has had worsening venous stasis issues.  Therefore I referred him to vascular surgery.  I do not have the vascular surgery notes with me.  However they report he saw Dr. Annamarie Major and according to them patient has venous insufficiency and has been recommended Lasix therapy with TED stockings.  They have been reassured by this.  The main issue is that he is having continued insomnia and fear of sleeping.  The wife has been giving him her own dose of lorazepam periodically.  In addition is also reporting worsening shortness of breath.  He says he desaturates using 4 L of oxygen.  Approximately a year ago he was on 3 L oxygen.  Review of the data indicates his FVC and DLCO are stable for the last year and 3 months but definitely worse compared to a year and a half or even 3 years ago.  He had a high-resolution CT chest in August 2019 that compared to 3 years earlier showed worsening.  So it appears that recently stable on lung function test for a year but subjectively continues to decline.  There is no fever.  This is despite Lasix therapy.  Review of the labs indicate and shown below is that he is having worsening chronic kidney disease.  He is  on full dose Esbriet.  His liver function test review is been normal.  He is now losing weight because of low appetite while on full dose Esbriet.   Walking desaturation test on 4 L oxygen resting pulse ox 92% heart rate 94.  He walked only 59 m which is half a lap and he desaturated to 84% and heart rate went up to 112.  This is clearly worse than April 2019.  Wife is saying that she is physically exhausted.  Patient also feels the same.  They are wondering about hospice care.  Patient feels that his wife's physical burden about taking care of him and emotional burden is significant  Results for Shawn Cruz, Shawn Cruz (MRN [id number]) as of 10/14/2018 12:13  Ref. Range 10/01/2016 15:00 03/22/2017 08:56 07/23/2017 10:55 03/04/2018 10:52 09/22/2018 14:46  FVC-Pre Latest Units: L 2.09 2.13 1.84 1.84 1.83  FVC-%Pred-Pre Latest Units: % 54 55 47 48 47   Results for Shawn Cruz, Shawn Cruz (MRN [id number]) as of 10/14/2018 12:13  Ref. Range 12/14/2014 13:27 03/29/2015 13:46 04/04/2016 13:14 10/01/2016 15:00 03/22/2017 08:56 07/23/2017 10:55 03/04/2018 10:52 09/22/2018 14:46  DLCO unc Latest Units: ml/min/mmHg 12.47 13.24  13.06 11.00 12.11 12.45 10.44  DLCO unc %  pred Latest Units: % 43 45  45 37 41 42 36   Results for Shawn Cruz, Shawn Cruz (MRN [id number]) as of 10/14/2018 12:13  Ref. Range 11/20/2017 14:28 03/04/2018 12:11 03/14/2018 11:52 07/10/2018 17:20 07/11/2018 16:21 08/05/2018 10:08  Creatinine Latest Ref Range: 0.40 - 1.50 mg/dL 1.49 1.71 (H) 1.62 (H)  1.61 (H) 1.74 (H)    ROS - per HPI     has a past medical history of Anxiety, Chronic kidney disease, DDD (degenerative disc disease), Diabetes mellitus, Fibromyalgia, GERD (gastroesophageal reflux disease), Gout, High cholesterol, Hyperglycemia, Hypertension, Shortness of breath dyspnea, and Sleep apnea.   reports that he quit smoking about 12 years ago. His smoking use included cigarettes. He has a 50.00 pack-year smoking history. He has quit using smokeless  tobacco.  His smokeless tobacco use included chew.  Past Surgical History:  Procedure Laterality Date  . BACK SURGERY     total of 3 surgeries  . back surrgery     2013  . CARDIAC CATHETERIZATION     01/2015  . EYE SURGERY     bilateral cataracts  . HERNIA REPAIR     upper ventral hernia (had as child)  . LEFT HEART CATHETERIZATION WITH CORONARY ANGIOGRAM N/A 01/11/2015   Procedure: LEFT HEART CATHETERIZATION WITH CORONARY ANGIOGRAM;  Surgeon: Adrian Prows, MD;  Location: [facility] CATH LAB;  Service: Cardiovascular;  Laterality: N/A;  . LITHOTRIPSY    . lumbar discetomy     1977  . LUNG BIOPSY Left 04/13/2015   Procedure: LUNG BIOPSY;  Surgeon: [name], MD;  Location: [facility];  Service: Thoracic;  Laterality: Left;  . removal of kidney stone     thru surgery  . rotator cuff repair     Dr. Durward Fortes  . VIDEO ASSISTED THORACOSCOPY Left 04/13/2015   Procedure: VIDEO ASSISTED THORACOSCOPY;  Surgeon: [name], MD;  Location: [facility];  Service: Thoracic;  Laterality: Left;  [name] VIDEO BRONCHOSCOPY N/A 04/13/2015   Procedure: VIDEO BRONCHOSCOPY;  Surgeon: [name], MD;  Location: [facility] OR;  Service: Thoracic;  Laterality: N/A;    Allergies  Allergen Reactions  . Sulfa Antibiotics Other (See Comments)    REACTION: Age 60, leg swelling REACTION: Age 19, leg swelling Childhood allergy  . Ofev [Nintedanib] Diarrhea  . Sulfonamide Derivatives     REACTION: Age 19, leg swelling    Immunization History  Administered Date(s) Administered  . Influenza Split 07/06/2014  . Influenza Whole 07/26/2011  . Influenza, High Dose Seasonal PF 07/15/2017, 07/23/2018  . Influenza,inj,Quad PF,6+ Mos 08/04/2013  . Influenza-Unspecified 07/22/2015  . Pneumococcal Conjugate-13 08/05/2014  . Pneumococcal Polysaccharide-23 08/22/2012, 07/20/2016    Family History  Problem Relation Age of Onset  . Hypertension Mother   . Heart attack Mother        age 82's  . Hypertension Father   . Heart attack  Father        age 63's  . Aortic aneurysm Sister      Current Outpatient Medications:  .  allopurinol (ZYLOPRIM) 300 MG tablet, Take 300 mg by mouth daily., Disp: , Rfl:  .  aspirin EC 81 MG tablet, Take 81 mg by mouth daily., Disp: , Rfl:  .  atorvastatin (LIPITOR) 10 MG tablet, Take 10 mg by mouth daily at 6 PM. , Disp: , Rfl:  .  chlorpheniramine (CHLOR-TRIMETON) 4 MG tablet, Take 4 mg by mouth 2 (two) times daily as needed for allergies., Disp: , Rfl:  .  Cholecalciferol (VITAMIN  D) 2000 UNITS CAPS, Take 5,000 Units by mouth daily. , Disp: , Rfl:  .  citalopram (CELEXA) 40 MG tablet, Take 1 tablet by mouth daily., Disp: , Rfl: 6 .  cyanocobalamin 100 MCG tablet, Take 100 mcg by mouth daily., Disp: , Rfl:  .  ESBRIET 801 MG TABS, TAKE 1 TABLET BY MOUTH THREE TIMES DAILY WITH FOOD, Disp: 90 tablet, Rfl: 11 .  furosemide (LASIX) 20 MG tablet, TAKE 3 TABLETS(60 MG) BY MOUTH TWICE DAILY, Disp: 540 tablet, Rfl: 1 .  HYDROcodone-acetaminophen (NORCO) 10-325 MG per tablet, Take 1 tablet by mouth every 6 (six) hours as needed., Disp: , Rfl:  .  Loperamide HCl (IMODIUM PO), Take by mouth., Disp: , Rfl:  .  ondansetron (ZOFRAN) 4 MG tablet, Take 1-2 tablets prn daily, Disp: 30 tablet, Rfl: 0 .  potassium chloride (KLOR-CON) 20 MEQ packet, Take 20 mEq by mouth daily., Disp: 7 tablet, Rfl: 0      Objective:   Vitals:   10/14/18 1146  BP: 122/78  Pulse: 89  SpO2: 93%  Weight: 211 lb 12.8 oz (96.1 kg)  Height: 5\' 8"  (1.727 m)    Estimated body mass index is 32.2 kg/m as calculated from the following:   Height as of this encounter: 5\' 8"  (1.727 m).   Weight as of this encounter: 211 lb 12.8 oz (96.1 kg).  @WEIGHTCHANGE @  [employer]   10/14/18 1146  Weight: 211 lb 12.8 oz (96.1 kg)     Physical Exam  General Appearance:    Alert, cooperative, no distress, appears stated age - yes , Deconditioned looking - yes , OBESE  - yes but thinner, Sitting on Wheelchair -  Yes due to o2  need but can walk and do aL  Head:    Normocephalic, without obvious abnormality, atraumatic  Eyes:    PERRL, conjunctiva/corneas clear,  Ears:    Normal TM's and external ear canals, both ears  Nose:   Nares normal, septum midline, mucosa normal, no drainage    or sinus tenderness. OXYGEN ON  - yues . Patient is @ 3L   Throat:   Lips, mucosa, and tongue normal; teeth and gums normal. Cyanosis on lips - no  Neck:   Supple, symmetrical, trachea midline, no adenopathy;    thyroid:  no enlargement/tenderness/nodules; no carotid   bruit or JVD  Back:     Symmetric, no curvature, ROM normal, no CVA tenderness  Lungs:     Distress - no , Wheeze no, Barrell Chest - no, Purse lip breathing - no, Crackles - yes at abase   Chest Wall:    No tenderness or deformity.    Heart:    Regular rate and rhythm, S1 and S2 normal, no rub   or gallop, Murmur - no  Breast Exam:    NOT DONE  Abdomen:     Soft, non-tender, bowel sounds active all four quadrants,    no masses, no organomegaly. Visceral obesity - yes  Genitalia:   NOT DONE  Rectal:   NOT DONE  Extremities:   Extremities - normal, Has Cane - no, Clubbing - no, Edema - no  Pulses:   2+ and symmetric all extremities  Skin:   Stigmata of Connective Tissue Disease - no  Lymph nodes:   Cervical, supraclavicular, and axillary nodes normal  Psychiatric:  Neurologic:   Pleasant - yes, Anxious - yes, Flat affect - yes  CAm-ICU - neg, Alert and Oriented x 3 - yes,  Moves all 4s - yes, Speech - normal, Cognition - intact           Assessment:       ICD-10-CM   1. Chronic hypoxemic respiratory failure (HCC) J96.11 Amb Referral to Palliative Care    ECHOCARDIOGRAM COMPLETE    CBC w/Diff    CBC w/Diff  2. IPF (idiopathic pulmonary fibrosis) (HCC) J84.112 Amb Referral to Palliative Care    ECHOCARDIOGRAM COMPLETE    CBC w/Diff    Hepatic function panel    Hepatic function panel    CBC w/Diff  3. Encounter for therapeutic drug monitoring Z51.81  CBC w/Diff    Hepatic function panel    Hepatic function panel    CBC w/Diff  4. Chronic kidney disease, unspecified CKD stage J62.8 Basic Metabolic Panel (BMET)    Basic Metabolic Panel (BMET)  5. Drug-induced weight loss R63.4    T50.905A        Plan:     Patient Instructions  Chronic hypoxemic respiratory failure (HCC) IPF (idiopathic pulmonary fibrosis) (HCC) Encounter for therapeutic drug monitoring   - getting worse  - do echo with Dr Einar Gip - my office to re-refer to ensure not heart issue  - continue o2 and esbriet full dose as before  -  Do cbc, bmet, lft - refer home palliative (not hospice) - next visit discuss goals esp hospice benefit   Chronic kidney disease, unspecified CKD stage - slowly getting worse  - do bmet  Drug-induced weight loss - probably from esbriet - address at followup depending on blood test esp creatinine  Followup 4 weeks with Dr Chase Caller in 30 min slot - ILD or regular clinic   > 50% of this > 40 min visit spent in face to face counseling or/and coordination of care - by this undersigned MD - Dr Brand Males. This includes one or more of the following documented above: discussion of test results, diagnostic or treatment recommendations, prognosis, risks and benefits of management options, instructions, education, compliance or risk-factor reduction    SIGNATURE    Dr. Brand Males, M.D., F.C.C.P,  Pulmonary and Critical Care Medicine Staff Physician, [place] Director - Interstitial Lung Disease  Program  Pulmonary [place] at [place], [place], [address]  Pager: [phone], If no answer or between  15:00h - 7:00h: call [phone] Telephone: [phone]  6:18 PM 10/14/2018

## 2018-10-14 NOTE — Telephone Encounter (Signed)
  Labs ok - creat improved but with weight loss and fatigue   Plan   - take esbriet 1 big 801mg  twice daily instead of  3 times daily till next visit and we can reassess  =- ensure echo with Dr Einar Gip   LABS    PULMONARY No results for input(s): PHART, PCO2ART, PO2ART, HCO3, TCO2, O2SAT in the last 168 hours.  Invalid input(s): PCO2, PO2  CBC Recent Labs  Lab 10/14/18 1309  HGB 13.8  HCT 40.8  WBC 7.7  PLT 226.0    COAGULATION No results for input(s): INR in the last 168 hours.  CARDIAC  No results for input(s): TROPONINI in the last 168 hours. No results for input(s): PROBNP in the last 168 hours.   CHEMISTRY Recent Labs  Lab 10/14/18 1309  NA 139  K 3.1*  CL 96  CO2 35*  GLUCOSE 96  BUN 38*  CREATININE 1.58*  CALCIUM 10.1   Estimated Creatinine Clearance: 45.4 mL/min (A) (by C-G formula based on SCr of 1.58 mg/dL (H)).   LIVER Recent Labs  Lab 10/14/18 1309  AST 25  ALT 16  ALKPHOS 69  BILITOT 0.4  PROT 7.8  ALBUMIN 4.0     INFECTIOUS No results for input(s): LATICACIDVEN, PROCALCITON in the last 168 hours.   ENDOCRINE CBG (last 3)  No results for input(s): GLUCAP in the last 72 hours.       IMAGING x48h  - image(s) personally visualized  -   highlighted in bold No results found.

## 2018-10-15 ENCOUNTER — Telehealth: Payer: Self-pay | Admitting: Internal Medicine

## 2018-10-15 NOTE — Telephone Encounter (Signed)
ATC pt, no answer. Left message for pt to call back.  I wanted to let him know the directions changed on his Rx for esbriet. He will now take bid as MR recommended.

## 2018-10-15 NOTE — Telephone Encounter (Signed)
Called and spoke with patients wife, she stated that the patient was supposed to be given something to help with sleeping. MR please advise thank you.

## 2018-10-16 ENCOUNTER — Other Ambulatory Visit: Payer: Self-pay | Admitting: *Deleted

## 2018-10-16 MED ORDER — PIRFENIDONE 801 MG PO TABS: 1.0000 | ORAL_TABLET | Freq: Three times a day (TID) | ORAL | 11 refills | Status: AC

## 2018-10-16 MED ORDER — TEMAZEPAM 7.5 MG PO CAPS: 7.5000 mg | ORAL_CAPSULE | Freq: Every evening | ORAL | 0 refills | Status: DC | PRN

## 2018-10-16 NOTE — Telephone Encounter (Signed)
Called and spoke with patient, he is aware and verbalized understanding. 

## 2018-10-16 NOTE — Telephone Encounter (Signed)
Sorry I forgot - restoril once at night 7.5mg   As needed. Give 30 tablets - 0 refills and then we can see. The wife should NOT give him her ativan during this time  Also, can you ask them please if their echo is scheduled

## 2018-10-16 NOTE — Telephone Encounter (Signed)
I called the Restoril into the [facility]. I called pt to advise him that this was sent in. Pt understood  HJe states his echocardiogram is on Dec 19th but was asking to verification on this date and time. I could not find a date and time, PCC's can you help with this so I can let pt know. Thanks.

## 2018-10-17 ENCOUNTER — Other Ambulatory Visit: Payer: Self-pay

## 2018-10-17 DIAGNOSIS — M7989 Other specified soft tissue disorders: Secondary | ICD-10-CM

## 2018-10-17 DIAGNOSIS — R5383 Other fatigue: Secondary | ICD-10-CM

## 2018-10-17 NOTE — Progress Notes (Signed)
am

## 2018-10-21 ENCOUNTER — Telehealth: Payer: Self-pay | Admitting: Internal Medicine

## 2018-10-21 NOTE — Telephone Encounter (Signed)
Notes from pt's last OV with MR 10/14/18:  Chronic hypoxemic respiratory failure (HCC) IPF (idiopathic pulmonary fibrosis) ([place]) Encounter for therapeutic drug monitoring   - getting worse  - do echo with Dr Einar Gip - my office to re-refer to ensure not heart issue  - continue o2 and esbriet full dose as before  -  Do cbc, bmet, lft - refer home palliative (not hospice) - next visit discuss goals esp hospice benefit   Called and spoke with Junie Panning letting her know the info stated by MR at pt's last OV. Erin expressed understanding and stated she would make a note of it. Nothing further needed.

## 2018-10-23 DIAGNOSIS — J9620 Acute and chronic respiratory failure, unspecified whether with hypoxia or hypercapnia: Secondary | ICD-10-CM | POA: Diagnosis not present

## 2018-10-23 DIAGNOSIS — J9611 Chronic respiratory failure with hypoxia: Secondary | ICD-10-CM | POA: Diagnosis not present

## 2018-11-07 ENCOUNTER — Telehealth: Payer: Self-pay | Admitting: Internal Medicine

## 2018-11-07 NOTE — Telephone Encounter (Signed)
[facility] and spoke with Junie Panning to see if she could send pt's echo results to our office. Junie Panning stated to me that she would. Gave her the fax number for triage. Will await the results.

## 2018-11-07 NOTE — Telephone Encounter (Signed)
Fax has been received of the echo that pt had performed that MR was wanting him to have done.  MR is out of the office until 11/11/18.  Aaron Edelman, please advise if you will be okay reviewing the fax of pt's echo results as pt is anxiously wanting to know what the results are due to Dr. Irven Shelling office calling him to come in for an appt and pt does not want to call them until he has heard further from [place] in regards to the echo results.

## 2018-11-07 NOTE — Telephone Encounter (Signed)
Checked through papers that is MR's office in his urgent review and sign folder to see if I could see results of pt's echo that he had performed but I did not see results of pt in there.   Called and spoke with pt stating to him that I did not see results of echo from Dr. Irven Shelling office and stated to pt that I would call their office to get them resent to our office. Stated to pt that MR was out of the office until 1/7 and asked if he would be okay to wait until he returned to have the results reviewed.   Pt stated to me that he had received a call from Dr. Irven Shelling office that they wanted him to come in for an appt and stated that he wanted MR or another provider at our office who is covering for MR's pts to let him know what the results of the echo are before he decided to call Dr. Irven Shelling office back to schedule an appt. I stated to pt as soon as we had the results sent to our office, we would check with APP of day to see if they would be okay reviewing pt's echo results. Will route this encounter to myself to follow up on once we receive pt's echo results.

## 2018-11-07 NOTE — Telephone Encounter (Signed)
Called and spoke with pt letting him know that BM stated for pt to follow up with cards in regards to the echo results. Stated to pt that we would have MR look at the results once he returned to the office but stated to follow up with cards as requested.  Pt expressed understanding. Routing to MR as pt's echo results have been placed in his purple urgent review and sign folder for him to see.

## 2018-11-07 NOTE — Telephone Encounter (Signed)
These results will need to be discussed with patient's cardiologist.  Patient needs to comply and schedule a office visit with his cardiologist to further go over these results, which is what cardiology is requesting.  Dr. Chase Caller requested the patient be evaluated by Dr. Einar Gip, patient needs to follow-up with Dr. Einar Gip regarding these results.  Wyn Quaker, FNP

## 2018-11-10 NOTE — Telephone Encounter (Signed)
What did the echo result show? I will only be in 11/11/18. Please call me to give result so if is urgent we can guide patient

## 2018-11-10 NOTE — Telephone Encounter (Signed)
Conclusions from echo copied below: 1. Left ventricle cavity is normal in size.  Moderate concentric hypertrophy of the left ventricle.  Normal global wall motion.  Doppler evidence of grade 1 (impared) diastolic dysfunction, normal LAP.  Hyperdynamic LV systolic function.  Visual EF is >70%.  Suspect intraventricular pressure gradient from cavity obliteration. 2. Right ventricle cavity is mildly dilated.  Mildly reduced right ventricular function. 3. Trileaflet aortic valve with no regurgitation noted.  Mild aortic valve leaflet calcification.  No evidence of aortic valve stenosis.   2. Structurally normal mitral valve with trace regurgitation. 5. Mild tricuspid regurgitation.  Moderate pulmonary hypertension.  Estimated pulmonary artery systolic pressure [id number]. 6. The aortic root is normal in size.  Mild calcific changes in the aorta.   7. 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  Full report is in Emily's possession.  MR please advise.  Thanks.

## 2018-11-11 ENCOUNTER — Other Ambulatory Visit: Payer: Self-pay | Admitting: Internal Medicine

## 2018-11-11 DIAGNOSIS — J849 Interstitial pulmonary disease, unspecified: Secondary | ICD-10-CM | POA: Diagnosis not present

## 2018-11-11 DIAGNOSIS — M4716 Other spondylosis with myelopathy, lumbar region: Secondary | ICD-10-CM | POA: Diagnosis not present

## 2018-11-11 NOTE — Telephone Encounter (Signed)
Thanks a lot. Please let Shawn Cruz know echo shows stiff heart muscle, elevated pressures and heart muscle being quite thick that is blocking its own passage of blood. This is why I suspect Dr Einar Gip wanted him and why he is NOT improving to lasix etc., He really needs to see Dr Einar Gip / team  And would like to take hospice decision based on if Dr Einar Gip can fix this problem or not  Thanks    SIGNATURE    Dr. Brand Males, M.D., F.C.C.P,  Pulmonary and Critical Care Medicine Staff Physician, Page Director - Interstitial Lung Disease  Program  Pulmonary [place] [facility] [place], [place], [address]  Pager: [phone], If no answer or between  15:00h - 7:00h: call [phone] Telephone: [phone]  8:29 AM 11/11/2018

## 2018-11-11 NOTE — Telephone Encounter (Signed)
Pt is requesting refill on Restoril 7.5mg  #30. 1 tab at bedtime prn.  Last refilled 10/16/18. Pt last seen 10/14/18 with pending ov for 11/27/18.  MR please advise on refill. thanks

## 2018-11-11 NOTE — Telephone Encounter (Signed)
Called and spoke with patient, advised him of response below. Patient stated that he has an appointment on 11/20/18 with his cardiologist. Nothing further needed.

## 2018-11-18 ENCOUNTER — Telehealth: Payer: Self-pay | Admitting: Internal Medicine

## 2018-11-18 MED ORDER — TEMAZEPAM 7.5 MG PO CAPS: 7.5000 mg | ORAL_CAPSULE | Freq: Every evening | ORAL | 0 refills | Status: DC | PRN

## 2018-11-18 MED ORDER — FUROSEMIDE 20 MG PO TABS: ORAL_TABLET | ORAL | 1 refills | Status: AC

## 2018-11-18 NOTE — Telephone Encounter (Signed)
Called and spoke with patient, he stated that he is needing a refill of his restoril and the furosemide. Refills sent to verified pharmacy. Nothing further needed.

## 2018-11-27 ENCOUNTER — Encounter: Payer: Self-pay | Admitting: Internal Medicine

## 2018-11-27 ENCOUNTER — Ambulatory Visit: Payer: Medicare Other | Admitting: Internal Medicine

## 2018-11-27 VITALS — BP 118/70 | HR 75 | Ht 68.0 in | Wt 205.2 lb

## 2018-11-27 DIAGNOSIS — J84112 Idiopathic pulmonary fibrosis: Secondary | ICD-10-CM | POA: Diagnosis not present

## 2018-11-27 DIAGNOSIS — J9611 Chronic respiratory failure with hypoxia: Secondary | ICD-10-CM

## 2018-11-27 DIAGNOSIS — I421 Obstructive hypertrophic cardiomyopathy: Secondary | ICD-10-CM | POA: Diagnosis not present

## 2018-11-27 DIAGNOSIS — G4700 Insomnia, unspecified: Secondary | ICD-10-CM | POA: Diagnosis not present

## 2018-11-27 NOTE — Patient Instructions (Signed)
Chronic hypoxemic respiratory failure (HCC) IPF (idiopathic pulmonary fibrosis) (HCC)  -Clinically stable since last visit but has been over time this is been progressive -Unclear how much of your heart issue is playing a role in your current shortness of breath but it is definitely worth cardiology Dr. Einar Gip giving an opinion on this -Rerefer home palliative care and based on their discussions with them you can decide to stay within palliative care or progress towards home hospice  HOCM (hypertrophic obstructive cardiomyopathy) ([place]) -Seen on echocardiogram December 2019 -I have sent a message to Dr. Christen Butter to ensure that you have follow-up  Insomnia, unspecified type  Start temazepam 7.5 mg once daily at night -30 tablets with 2 refills  Follow-up -In 2 months with spirometry  -Return in 2 months to ILD clinic

## 2018-11-27 NOTE — Addendum Note (Signed)
Addended by: Vivia Ewing on: 11/27/2018 03:44 PM   Modules accepted: Orders

## 2018-11-27 NOTE — Progress Notes (Signed)
5/31/2017Follow Up Appointment: Pt. Presents to the office for follow up. He feels he is more short of breath, especially with exertion. He also states he is fatigued easily.[name] He is taking 200 mg of Ofab daily. He is tolerating the 200 mg dose well. He was not tolerating the 300 mg dose.There has been a decline in PFT's over the last year of about 15%. We will discuss changing medication from Ofev to [place].   Tests: PFT's 04/04/2016:  FVC: 2.18/ 56% Decline over last year which was 2.34/59%   OV 05/23/2016  Chief Complaint  Patient presents with  . Follow-up    pt states his SOB is stable- has been on esbriet X30 days, only side effect noticed is that increased fatigue and L foot swelling.  Pt has noticed swelling X2 wks.      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 Overall he is tolerating it fine. He wears sunscreen when he goes out for a prolonged period of time. No skin issues. He is having some fatigue. There no GI side effects. He is also noticing some worsening ankle edema particularly on the left side associated with foot edema in the last few weeks. He is wondering if it's related to Pirfenidone (Esbriet). He is on Lasix for chronic venous insufficiency and pedal edema at 40 mg twice a day. This no fever or chills or nausea vomiting. The fatigue is slightly worse than baseline. It is moderate in severity. He has never wanted her to pulmonary rehabilitation due to cost. In terms of dyspnea this is stable   OV 06/27/2016  Chief Complaint  Patient presents with  . Idiopathic Pulmonary Fibrosis    Breathing is worse since last OV. Reports increased SOB, dry cough. Denies chest tightness, wheezing.    FU IPF with chronic hypoxemic resp fialure pn esbeit since June 2017  Tolerating esbriet fine. No skin issues. Having GERD per wife though he denied it. Relieved by TUMS. Not  taking PPI; wife feels he needs to be on PPI. Overall feels dyspnea is worse but denies chest pain or edema. Stil uses 3L [place] and does not desaturate below 89% on self monitoring.      OV 10/01/2016   Chief Complaint  Patient presents with  . Follow-up    for PFT results. Pt. states he is breathing about the same. Wife states the opposite. Breathing is worse.    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 Fine. No skin issues. However wife begins to think that he is beginning to have low appetite and some weight loss. However he says because of his obesity visit this is a good thing for him. He tells me that he is stable. This 3 L of oxygen at rest he is able to do his ADLs and only very rarely desaturated to 86%. He says this is stable compared to his last visit in Aug  ust 2017. Off note he is yet to get his Pirfenidone (Esbriet) at one pill 3 times a day which is the 3 pills rolled and 1 pill. . Wife thinks he is beginning to decline because at night she feels that he is a bit more tachypneic. Pulmonary function test done this visit show stability compared to May 2017. It appears that he is stable and starting Pirfenidone (Esbriet). His wife is wondering about transplant  eligibility      OV 03/22/2017  Chief Complaint  Patient presents with  . 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 There are no skin issues. He is tolerating Pirfenidone (Esbriet) just fine except for mild fatigue and some taste issues. But he definitely feels that the Pirfenidone (Esbriet) is better tolerated than the Ofev. He also feels that the aspirate has halted progression of disease. Otherwise no new issues. Spirometry and DLCO shows relative  stability in the last 1 year.   OV 07/23/2017  Chief Complaint  Patient presents with  . Follow-up    PFT today. Pt states that he has had some issues with his SOB due to the climate, occ. cough. Denies any CP. DME: [facility], 23L O70.   78 year old male with idiopathic pulmonary fibrosis and chronic hypoxemic respiratory failure. Presents with wife [name]. He tells me that over time in the last 1 year he feels more short of breath but still has a good functional quality of life. He is stable on Pirfenidone (Esbriet). Able to tolerated well. Is due for liver function test today. Interstitial lung disease symptom score shows significant symptom burden. Also lung function shows a 10% decline in FVC and DLCO in the last 1 year. At this point in time he wants a lighter portable oxygen system. Also of note he asked if I could refill his pain medications because his neurosurgeon Dr. Glenna Fellows retiring. I defer this to primary care physician or pain clinic or to the neurosurgery practice   OV 03/04/2018  Chief Complaint  Patient presents with  . Follow-up    PFT done today.  Pt states his breathing has become worse and has a mild cough. Per pt's wife, pt has no energy, little appetite, and is losing strength. Pt is still taking Esbriet and only complaint is it has been messing with his appetite. Denies any CP.    Follow-up idiopathic pulmonary fibrosis . DX April 13, 2015 via biopsy [facility]. STopped ofev spring 2017 due to diarrhea/intolerance. On esbrit since June 2017. Also with chronic hypoxemic respiratory failure on 3 L of oxygen   [name] presents with his wife. He tells me in the last 3 months he feels more short of breath than usual. He thinks he might be desaturating when he walks on 3 L of oxygen to the bathroom and back although this is not properly documented t home. He remains on 3 L.  He remains on esbriet. Does have a constellation of mild nausea and moderate amount of fatigue and some20 pound  weight loss in the last few years. He feels this is because of Pirfenidone (Esbriet).   Walking desaturation test on 03/04/2018 185 feet x 3 laps on  3L [place]   - did only  2 laps and slow and stopped due to severe dyspnea. did *NOTdesaturate. Rest pulse ox was 99%, final pulse ox was 93%. HR response 86/min at rest to 104/min at peak exertion. Patient Lenon Oms  Did not Desaturate < 88% . Lenon Oms did yes  Desaturated </= 3% points. Lenon Oms did not get tachyardic  PFT show stability since Sept 2018  LAb revewi shows mild decrease in GFR jan 2019    OV 07/10/2018  Subjective:  Patient ID: Lenon Oms,  male , DOB: 1940/12/22 , age 77 y.o. , MRN: [id number] , ADDRESS: [place] [id number]   07/10/2018 -   Chief Complaint  Patient presents with  . Follow-up    CT performed 8/13.  Pt states when he had the CT scan performed as well as ABG, he was having a hard time breathing. Pt states his breathing has been stable since was put on steroids. Pt is also coughing which is nonproductive. Denies any CP.    Follow-up idiopathic pulmonary fibrosis . DX April 13, 2015 via biopsy [facility]. STopped ofev spring 2017 due to diarrhea/intolerance. On esbrit since June 2017. Also with chronic hypoxemic respiratory failure on 3 L of oxygen    HPI TARAY NORMOYLE 78 y.o. - presents for follow-up of his IPF.last visit end of April 2019. At that point we reduced his Pirfenidone (Esbriet)  To two third of normal dose. He takes one large pill 2 times a day. He says he has no side effects. Review of the chart indicates a rational for reducing the dose was mild chronic renal insufficiency. At this point in time he says he tolerates the Pirfenidone (Esbriet) quite fine. Recently had a flareup and he got a shot on prednisone which helped him but he is back to baseline. His wife Mrs. [name] is frustrated that he is excessively fatigued and sleepy during the daytime. There is  also lack of motivation which the patient admits. He is always refused to go to rehabilitation in the past. The wife is asking abouttesting for sleep apnea but the patient refusedsaying that he will never wear his CPAP because he cannot tolerate it. Wife then asked for chronic daily prednisone we discussed the side effects and the risks and chronic IPF management be on the situation of a flareup or treatment of severe cough. Based on this patient is not inclined.We discussed potential pulmonary rehabilitation but he refused again. However he is open to physical therapy at home. He is also open to increasing the dose of the Pirfenidone (Esbriet) a full dose.he is also agreeable to makingfollow-up every 6-12 weeks.    - per HPI   OV 08/05/2018  Subjective:  Patient ID: Lenon Oms, male , DOB: [birth year] , age 23 y.o. , MRN: [id number] , ADDRESS: [place] [id number]   08/05/2018 -   Chief Complaint  Patient presents with  . Acute Home Visit    Pt has had increased leg swelling and SOB. Pt's feet and legs are both red and purple which keeps getting worse. Pt states SOB is about the same as last visit.     HPI PAOLO OKANE 78 y.o. -IPF patient.  He supposed to come and see me for routine IPF follow-up in the next few days.  However he called in acutely yesterday and his wife also called in.  Therefore he has been brought in acutely.  He says his pulmonary status on 4 L of oxygen and his current intake is stable.  However he has had worsening pedal edema.  For the last several weeks he has been sleeping in a chair.  According to him he is actually been sleeping in a chair for the last few months.  And in the last week or few weeks there is been a purplish discoloration of both feet.  He denies being a diabetic.  There is no wound ulcers.  There is no trauma.  There is no fever or blister.  His calves are warm according to his history.  There is also scaling of the skin.  He  is on Lasix 60 mg in the morning and 40 mg at night.  He has done blood work today and the results are pending.   OV 10/14/2018  Subjective:  Patient ID: Lenon Oms, male , DOB: 07-09-1941 , age 47 y.o. , MRN: [id number] , ADDRESS: [place] [id number]   10/14/2018 -   Chief Complaint  Patient presents with  . Follow-up    pt states breathing has worsen since last ov. pt reports of increased sob with exertion, occ dry cough &  occ wheezing     HPI NORIS KULINSKI 78 y.o. -IPF with chronic hypoxemic respiratory failure follow-up associated with chronic kidney disease and also cor pulmonale with chronic venous stasis edema on Lasix therapy  Presents with his wife [name].  Saw me last 2 months ago in October 2019.  Since then has had worsening venous stasis issues.  Therefore I referred him to vascular surgery.  I do not have the vascular surgery notes with me.  However they report he saw Dr. Annamarie Major and according to them patient has venous insufficiency and has been recommended Lasix therapy with TED stockings.  They have been reassured by this.  The main issue is that he is having continued insomnia and fear of sleeping.  The wife has been giving him her own dose of lorazepam periodically.  In addition is also reporting worsening shortness of breath.  He says he desaturates using 4 L of oxygen.  Approximately a year ago he was on 3 L oxygen.  Review of the data indicates his FVC and DLCO are stable for the last year and 3 months but definitely worse compared to a year and a half or even 3 years ago.  He had a high-resolution CT chest in August 2019 that compared to 3 years earlier showed worsening.  So it appears that recently stable on lung function test for a year but subjectively continues to decline.  There is no fever.  This is despite Lasix therapy.  Review of the labs indicate and shown below is that he is having worsening chronic kidney disease.  He is  on full dose Esbriet.  His liver function test review is been normal.  He is now losing weight because of low appetite while on full dose Esbriet.   Walking desaturation test on 4 L oxygen resting pulse ox 92% heart rate 94.  He walked only 33 m which is half a lap and he desaturated to 84% and heart rate went up to 112.  This is clearly worse than April 2019.  Wife is saying that she is physically exhausted.  Patient also feels the same.  They are wondering about hospice care.  Patient feels that his wife's physical burden about taking care of him and emotional burden is significant  OV 11/27/2018  Subjective:  Patient ID: Lenon Oms, male , DOB: [birth year] , age 38 y.o. , MRN: [id number] , ADDRESS: [place] [id number]   11/27/2018 -   Chief Complaint  Patient presents with  . Follow-up    Pt had echo performed and a referral back to cardiology. Pt states he believes breathing has become worse and states he has a mild cough.   Chronic hypoxemic respiratory failure .  IPF on Esbriet, chronic kidney disease and cor pulmonale  HPI Lenon Oms  78 y.o. -presents with his wife.  Overall since last visit no change.  At the last visit we initiated home palliative care but apparently they played phone tag and they never heard from home palliative care.  They are contemplating hospice but they feel that not ready for this.  We ordered an echocardiogram and this showed HOCM possibly.  He got a call from Dr. Einar Gip to see him but appointment has been rescheduled multiple times according to him.  I think he has a new appointment now.  I have messaged Dr. Einar Gip about this.  He continues to have significant insomnia.  He says temazepam helped him and he wants prescription refill for this.  Overall is poor quality of life.  He sitting on wheelchair.  At this point in time he wants significant symptom support.  He is extremely worried about his wife and her ability to take care of  him.    Results for DONEL, OSOWSKI (MRN [id number]) as of 10/14/2018 12:13  Ref. Range 10/01/2016 15:00 03/22/2017 08:56 07/23/2017 10:55 03/04/2018 10:52 09/22/2018 14:46  FVC-Pre Latest Units: L 2.09 2.13 1.84 1.84 1.83  FVC-%Pred-Pre Latest Units: % 54 55 47 48 47   Results for NAHZIR, POHLE (MRN [id number]) as of 10/14/2018 12:13  Ref. Range 12/14/2014 13:27 03/29/2015 13:46 04/04/2016 13:14 10/01/2016 15:00 03/22/2017 08:56 07/23/2017 10:55 03/04/2018 10:52 09/22/2018 14:46  DLCO unc Latest Units: ml/min/mmHg 12.47 13.24  13.06 11.00 12.11 12.45 10.44  DLCO unc % pred Latest Units: % 43 45  45 37 41 42 36   Results for JANIEL, CRISOSTOMO (MRN [id number])   Results for KERRON, SEDANO (MRN [id number]) as of 11/27/2018 14:59  Ref. Range 08/05/2018 10:08 10/14/2018 13:09  Creatinine Latest Ref Range: 0.40 - 1.50 mg/dL 1.74 (H) 1.58 (H)    ROS - per HPI     has a past medical history of Anxiety, Chronic kidney disease, DDD (degenerative disc disease), Diabetes mellitus, Fibromyalgia, GERD (gastroesophageal reflux disease), Gout, High cholesterol, Hyperglycemia, Hypertension, Shortness of breath dyspnea, and Sleep apnea.   reports that he quit smoking about 13 years ago. His smoking use included cigarettes. He has a 50.00 pack-year smoking history. He has quit using smokeless tobacco.  His smokeless tobacco use included chew.  Past Surgical History:  Procedure Laterality Date  . BACK SURGERY     total of 3 surgeries  . back surrgery     2013  . CARDIAC CATHETERIZATION     01/2015  . EYE SURGERY     bilateral cataracts  . HERNIA REPAIR     upper ventral hernia (had as child)  . LEFT HEART CATHETERIZATION WITH CORONARY ANGIOGRAM N/A 01/11/2015   Procedure: LEFT HEART CATHETERIZATION WITH CORONARY ANGIOGRAM;  Surgeon: Adrian Prows, MD;  Location: [facility] CATH LAB;  Service: Cardiovascular;  Laterality: N/A;  . LITHOTRIPSY    . lumbar discetomy     1977  . LUNG BIOPSY Left 04/13/2015    Procedure: LUNG BIOPSY;  Surgeon: [name], MD;  Location: [place];  Service: Thoracic;  Laterality: Left;  . removal of kidney stone     thru surgery  . rotator cuff repair     Dr. Durward Fortes  . VIDEO ASSISTED THORACOSCOPY Left 04/13/2015   Procedure: VIDEO ASSISTED THORACOSCOPY;  Surgeon: [name], MD;  Location: [place];  Service: Thoracic;  Laterality: Left;  [name] VIDEO BRONCHOSCOPY N/A 04/13/2015   Procedure: VIDEO BRONCHOSCOPY;  Surgeon: [name], MD;  Location[facility]  OR;  Service: Thoracic;  Laterality: N/A;    Allergies  Allergen Reactions  . Sulfa Antibiotics Other (See Comments)    REACTION: Age 78, leg swelling REACTION: Age 78, leg swelling Childhood allergy  . Ofev [Nintedanib] Diarrhea  . Sulfonamide Derivatives     REACTION: Age 78, leg swelling    Immunization History  Administered Date(s) Administered  . Influenza Split 07/06/2014  . Influenza Whole 07/26/2011  . Influenza, High Dose Seasonal PF 07/15/2017, 07/23/2018  . Influenza,inj,Quad PF,6+ Mos 08/04/2013  . Influenza-Unspecified 07/22/2015  . Pneumococcal Conjugate-13 08/05/2014  . Pneumococcal Polysaccharide-23 08/22/2012, 07/20/2016    Family History  Problem Relation Age of Onset  . Hypertension Mother   . Heart attack Mother        age 46's  . Hypertension Father   . Heart attack Father        age 22's  . Aortic aneurysm Sister      Current Outpatient Medications:  .  ondansetron (ZOFRAN) 4 MG tablet, Take 1-2 tablets prn daily, Disp: 30 tablet, Rfl: 0 .  Pirfenidone (ESBRIET) 801 MG TABS, Take 1 tablet by mouth 3 (three) times daily with meals., Disp: 90 tablet, Rfl: 11 .  potassium chloride (KLOR-CON) 20 MEQ packet, Take 20 mEq by mouth daily., Disp: 7 tablet, Rfl: 0 .  temazepam (RESTORIL) 7.5 MG capsule, Take 1 capsule (7.5 mg total) by mouth at bedtime as needed for sleep., Disp: 30 capsule, Rfl: 0 .  allopurinol (ZYLOPRIM) 300 MG tablet, Take 300 mg by mouth daily., Disp: ,  Rfl:  .  aspirin EC 81 MG tablet, Take 81 mg by mouth daily., Disp: , Rfl:  .  atorvastatin (LIPITOR) 10 MG tablet, Take 10 mg by mouth daily at 6 PM. , Disp: , Rfl:  .  chlorpheniramine (CHLOR-TRIMETON) 4 MG tablet, Take 4 mg by mouth 2 (two) times daily as needed for allergies., Disp: , Rfl:  .  Cholecalciferol (VITAMIN D) 2000 UNITS CAPS, Take 5,000 Units by mouth daily. , Disp: , Rfl:  .  citalopram (CELEXA) 40 MG tablet, Take 1 tablet by mouth daily., Disp: , Rfl: 6 .  cyanocobalamin 100 MCG tablet, Take 100 mcg by mouth daily., Disp: , Rfl:  .  furosemide (LASIX) 20 MG tablet, TAKE 3 TABLETS(60 MG) BY MOUTH TWICE DAILY, Disp: 540 tablet, Rfl: 1 .  HYDROcodone-acetaminophen (NORCO) 10-325 MG per tablet, Take 1 tablet by mouth every 6 (six) hours as needed., Disp: , Rfl:  .  Loperamide HCl (IMODIUM PO), Take by mouth., Disp: , Rfl:       Objective:   Vitals:   11/27/18 1435  BP: 118/70  Pulse: 75  SpO2: 95%  Weight: 205 lb 3.2 oz (93.1 kg)  Height: 5\' 8"  (1.727 m)    Estimated body mass index is 31.2 kg/m as calculated from the following:   Height as of this encounter: 5\' 8"  (1.727 m).   Weight as of this encounter: 205 lb 3.2 oz (93.1 kg).  @WEIGHTCHANGE @  [employer]   11/27/18 1435  Weight: 205 lb 3.2 oz (93.1 kg)     Physical Exam  General Appearance:    Alert, cooperative, no distress, appears stated age - older , Deconditioned looking - yes , OBESE  - yes, Sitting on Wheelchair -  yes  Head:    Normocephalic, without obvious abnormality, atraumatic  Eyes:    PERRL, conjunctiva/corneas clear,  Ears:    Normal TM's and external ear canals, both ears  Nose:   Nares normal, septum midline, mucosa normal, no drainage    or sinus tenderness. OXYGEN ON  - yes . Patient is @ 3L   Throat:   Lips, mucosa, and tongue normal; teeth and gums normal. Cyanosis on lips - no  Neck:   Supple, symmetrical, trachea midline, no adenopathy;    thyroid:  no  enlargement/tenderness/nodules; no carotid   bruit or JVD  Back:     Symmetric, no curvature, ROM normal, no CVA tenderness  Lungs:     Distress - no , Wheeze no, Barrell Chest - no, Purse lip breathing - no, Crackles - yes   Chest Wall:    No tenderness or deformity.    Heart:    Regular rate and rhythm, S1 and S2 normal, no rub   or gallop, Murmur - no  Breast Exam:    NOT DONE  Abdomen:     Soft, non-tender, bowel sounds active all four quadrants,    no masses, no organomegaly. Visceral obesity - yes  Genitalia:   NOT DONE  Rectal:   NOT DONE  Extremities:   Extremities - normal, Has Cane - no, Clubbing - yes, Edema - yes  Pulses:   2+ and symmetric all extremities  Skin:   Stigmata of Connective Tissue Disease - no  Lymph nodes:   Cervical, supraclavicular, and axillary nodes normal  Psychiatric:  Neurologic:   Pleasant - yes, Anxious - no, Flat affect - yes  CAm-ICU - neg, Alert and Oriented x 3 - yes, Moves all 4s - yes, Speech - normal, Cognition - intact           Assessment:       ICD-10-CM   1. Chronic hypoxemic respiratory failure (HCC) J96.11   2. IPF (idiopathic pulmonary fibrosis) ([place]) J84.112   3. 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 Insomnia, unspecified type G47.00        Plan:     Patient Instructions  Chronic hypoxemic respiratory failure ([place]) IPF (idiopathic pulmonary fibrosis) (HCC)  -Clinically stable since last visit but has been over time this is been progressive -Unclear how much of your heart issue is playing a role in your current shortness of breath but it is definitely worth cardiology Dr. Einar Gip giving an opinion on this -Rerefer home palliative care and based on their discussions with them you can decide to stay within palliative care or progress towards home hospice  HOCM (hypertrophic obstructive cardiomyopathy) ([place]) -Seen on echocardiogram December 2019 -I have sent a message to Dr. Christen Butter to ensure that  you have follow-up  Insomnia, unspecified type  Start temazepam 7.5 mg once daily at night -30 tablets with 2 refills  Follow-up -In 2 months with spirometry  -Return in 2 months to ILD clinic     SIGNATURE    Dr. Brand Males, M.D., F.C.C.P,  Pulmonary and Critical Care Medicine Staff Physician, [place] Director - Interstitial Lung Disease  Program  [facility] [place] [facility] [place], [place], [address]  Pager: [phone], If no answer or between  15:00h - 7:00h: call [phone] Telephone: [phone]  3:21 PM 11/27/2018

## 2018-11-28 ENCOUNTER — Telehealth: Payer: Self-pay | Admitting: Internal Medicine

## 2018-11-28 MED ORDER — TEMAZEPAM 7.5 MG PO CAPS: 7.5000 mg | ORAL_CAPSULE | Freq: Every evening | ORAL | 2 refills | Status: AC | PRN

## 2018-11-28 NOTE — Telephone Encounter (Signed)
I have called the patient Pharmacy [facility]  to ensure they had not received this  Medication refill the pharmacist states they had not received it by phone or voicemail or e-script. While on the phone I went ahead and provided the refill for them. Per MR last OV notes. I have called and left the patient a detailed message per his DPR. Nothing further needed at this time.

## 2018-12-02 DIAGNOSIS — M47816 Spondylosis without myelopathy or radiculopathy, lumbar region: Secondary | ICD-10-CM | POA: Diagnosis not present

## 2018-12-04 DIAGNOSIS — R0602 Shortness of breath: Secondary | ICD-10-CM | POA: Diagnosis not present

## 2018-12-04 DIAGNOSIS — I10 Essential (primary) hypertension: Secondary | ICD-10-CM | POA: Diagnosis not present

## 2018-12-04 DIAGNOSIS — J84112 Idiopathic pulmonary fibrosis: Secondary | ICD-10-CM | POA: Diagnosis not present

## 2018-12-04 DIAGNOSIS — I517 Cardiomegaly: Secondary | ICD-10-CM | POA: Diagnosis not present

## 2018-12-08 DIAGNOSIS — I872 Venous insufficiency (chronic) (peripheral): Secondary | ICD-10-CM | POA: Diagnosis not present

## 2018-12-08 DIAGNOSIS — L821 Other seborrheic keratosis: Secondary | ICD-10-CM | POA: Diagnosis not present

## 2018-12-08 DIAGNOSIS — I8311 Varicose veins of right lower extremity with inflammation: Secondary | ICD-10-CM | POA: Diagnosis not present

## 2018-12-08 DIAGNOSIS — I8312 Varicose veins of left lower extremity with inflammation: Secondary | ICD-10-CM | POA: Diagnosis not present

## 2018-12-11 ENCOUNTER — Telehealth: Payer: Self-pay

## 2018-12-11 NOTE — Telephone Encounter (Signed)
Phone call placed to patient to schedule visit with Palliative care. Education provided to patient's wife regarding palliative care. Visit scheduled for Monday 12/15/2018.

## 2018-12-12 DIAGNOSIS — M4716 Other spondylosis with myelopathy, lumbar region: Secondary | ICD-10-CM | POA: Diagnosis not present

## 2018-12-12 DIAGNOSIS — J849 Interstitial pulmonary disease, unspecified: Secondary | ICD-10-CM | POA: Diagnosis not present

## 2018-12-15 ENCOUNTER — Encounter: Payer: Self-pay | Admitting: Internal Medicine

## 2018-12-15 ENCOUNTER — Other Ambulatory Visit: Payer: Medicare Other | Admitting: Internal Medicine

## 2018-12-15 DIAGNOSIS — Z515 Encounter for palliative care: Secondary | ICD-10-CM

## 2018-12-15 NOTE — Progress Notes (Signed)
[facility] Consult Note Telephone: [phone]  Fax: [phone]  PATIENT NAME: Shawn Cruz DOB: 05/28/1941 MRN: [id number]  PRIMARY CARE PROVIDER: [address] REFERRING PROVIDER:  Brand Males, [address] [place], [place] [address]  RESPONSIBLE PARTY:  (spouse) [name]  [phone]. Patient states he is estranged from his children  ASSESSMENT:     1. Weakness, dyspnea r/t deconditioning and pulmonary fibrosis:  Resting Sats (O2 4L) 94%, and decrease to high 70's, low 80's after ambulating 40-60 feet, with associated marked DOE. Takes his 15 min to recover to his baseline. He sometimes utilizes a cane while inside. He only leaves the home for doctor visits, utilizing a walker and 02 tanks. He has had 2 falls over the last 6 months; can't remember the circumstance of the falls.   2. Insomnia: Patient takes Restoril qhs and sleeps 4 hours until awakened by LE pain; occasionally awakened by orthopnea. He is unable to sleep propped up. He naps frequently through the day. We discussed taking tylenol before bedtime. Also, the fact that his daytime naps are paying his daily sleep debt.  3. Back Pain "all over my back"; multi back surgeries in the past: Managed with 5-325 hydrocodone-acetaminophen Q 6 hrs. He has been taking this chronically for about 3 years. He is afraid the with the retirement of his neuro surgeon Dr. Carloyn Manner, that is will be difficult for him to obtain prescriptions for his low dose opioid. Arthritis bilateral hands improved with warm rice bags. He has tried heat/ice, as well as SalonPas patch in the past for his back pain.  -Discussed possible weaning off of his opioids; slow wean with addition of extra doses of tylenol. Discussed keeping the total amount of tylenol taken / day to less than 3 grams. Could supplement with 200-400 mg ibuprofen.    4. LE edema and  changes of venous stasis: Tightly pitting LE swelling to high calf, dependent in nature. Unable to manage to put on compression hose, though would wear them if he could. On Lasix 60mg  bid. Drinks at least 2 liters of water/day plus some soda. He liberally use salt on his foods. He has recently been started on LE Triamcinolone / Cetaphil cream by his dermatologist Dr. Amy Martinique. He elevated his LEs as often as he can.  -We discussed decreasing the salt in his diet; patient admits this would be difficult. We talked about perhaps just starting by not adding salt to his foods, and trying to add Mrs. Dash instead. Also discussed decreasing his oral water intake.  -Consider OT consult for suggestions on application of compression hose/energy saving tips.  5. Constipation: Managed with prn MiraLax, dulcolax, and Colace. He doesn't take anything on a regular basis, just when he becomes constipated which is fairly often.   -Take dulcolax and colace on a daily basis to keep regular; adjust to effect.  6. Advanced Care Directives: Completes DNR form; left 2 copies in the home (one for the fridge, the other to bring when he travels outside the home). We discussed details of the MOST form. A copy of this form and educational materials left in the home.  -Patient will call for me to come by, if he wishes to complete the MOST form.  7. Follow up: 1-2 months or prn    I spent 120 minutes providing this consultation,  from 1:30pm to 3:30pm. More than 50% of  the time in this consultation was spent coordinating communication.   HISTORY OF PRESENT ILLNESS:  Shawn Cruz is a 78 y.o.  male with medical h/o NTN, HLD, idiopathic pulmonary fibrosis, and lumar spondylosis. Palliative Care was asked to help address goals of care.   CODE STATUS: DNR  PPS: 50% HOSPICE ELIGIBILITY/DIAGNOSIS: TBD  PAST MEDICAL HISTORY:  Past Medical History:  Diagnosis Date  . Anxiety   . Chronic kidney disease    has kidney  stones  "galore"  Last attack was 8-10 yrs ago  . DDD (degenerative disc disease)    LS spine  . Diabetes mellitus    diet controlled  takes no meds  . Fibromyalgia   . GERD (gastroesophageal reflux disease)    takes tums occasionally for indigestion  . Gout   . High cholesterol   . Hyperglycemia   . Hypertension    history of....not taking anything now  . Shortness of breath dyspnea   . Sleep apnea    tested "yrs ago" ...unable to tolerate mask....PCP [facility] is aware    SOCIAL HX:  Social History   Tobacco Use  . Smoking status: Former Smoker    Packs/day: 1.00    Years: 50.00    Pack years: 50.00    Types: Cigarettes    Last attempt to quit: 11/05/2005    Years since quitting: 13.1  . Smokeless tobacco: Former Systems developer    Types: Chew  . Tobacco comment: Quit at age 50  Substance Use Topics  . Alcohol use: Yes    Alcohol/week: 1.0 standard drinks    Types: 1 Cans of beer per week    Comment: occass    ALLERGIES:  Allergies  Allergen Reactions  . Sulfa Antibiotics Other (See Comments)    REACTION: Age 23, leg swelling REACTION: Age 35, leg swelling Childhood allergy  . Ofev [Nintedanib] Diarrhea  . Sulfonamide Derivatives     REACTION: Age 35, leg swelling     PERTINENT MEDICATIONS:  Outpatient Encounter Medications as of 12/15/2018  Medication Sig  . allopurinol (ZYLOPRIM) 300 MG tablet Take 300 mg by mouth daily. Take prn as needed for gout flares. Not currently taking (for greater than 1 year)  . aspirin EC 81 MG tablet Take 81 mg by mouth daily.  [name] atorvastatin (LIPITOR) 10 MG tablet Take 10 mg by mouth daily at 6 PM.   . Calcium Carbonate-Vit D-Min (GNP CALCIUM 1200) 1200-1000 MG-UNIT CHEW Chew 1 tablet by mouth daily.  . chlorpheniramine (CHLOR-TRIMETON) 4 MG tablet Take 4 mg by mouth 2 (two) times daily as needed for allergies. Prn for itchiness; takes about qd  . Cholecalciferol (VITAMIN D) 2000 UNITS CAPS Take 1,000 Units by mouth daily. 1000 IU (25 mcg) once a  day  . citalopram (CELEXA) 40 MG tablet Take 1 tablet by mouth daily.  . cyanocobalamin 100 MCG tablet Take 2,500 mcg by mouth daily. 2500 mcg once a day  . furosemide (LASIX) 20 MG tablet TAKE 3 TABLETS(60 MG) BY MOUTH TWICE DAILY  . HYDROcodone-acetaminophen (NORCO) 10-325 MG per tablet Take 1 tablet by mouth every 6 (six) hours as needed.  . Loperamide HCl (IMODIUM PO) Take by mouth. Only rarely taking  . ondansetron (ZOFRAN) 4 MG tablet Take 1-2 tablets prn daily  . Pirfenidone (ESBRIET) 801 MG TABS Take 1 tablet by mouth 3 (three) times daily with meals. (Patient taking differently: Take 1 tablet by mouth 2 (two) times daily with a meal. Decreased to bid per  Dr. Chase Caller 2/2 stomach upset)  . temazepam (RESTORIL) 7.5 MG capsule Take 1 capsule (7.5 mg total) by mouth at bedtime as needed for sleep.  [name] oxyCODONE (OXY IR/ROXICODONE) 5 MG immediate release tablet   . [DISCONTINUED] potassium chloride (KLOR-CON) 20 MEQ packet Take 20 mEq by mouth daily.  . [DISCONTINUED] temazepam (RESTORIL) 7.5 MG capsule Take 1 capsule (7.5 mg total) by mouth at bedtime as needed for sleep.   No facility-administered encounter medications on file as of 12/15/2018.     PHYSICAL EXAM:  VA BP: 122/74, HR: 68, RR 24 Well nourished, older male. Dyspnea after answering the door Cardiovascular: regular rate and rhythm Pulmonary: clear ant fields Abdomen: soft, nontender, + bowel sounds Extremities: Bilateral LE edema to upper calves. LE changes of venous stasis.  Neurological: Weakness but otherwise nonfocal  Julianne Handler, NP

## 2018-12-22 ENCOUNTER — Telehealth: Payer: Self-pay | Admitting: Internal Medicine

## 2018-12-22 NOTE — Telephone Encounter (Signed)
3:50pm Return TC from the office of Dr. Coletta Memos confirming referral sent for OT home visits. Violeta Gelinas NP [phone]

## 2018-12-22 NOTE — Telephone Encounter (Signed)
1:15pm:  Voice message left with Dr. Chales Abrahams referral specialist, to request referral for Mr. Shawn Cruz, for OT. Violeta Gelinas NP Palliative Care [phone]

## 2018-12-29 DIAGNOSIS — N183 Chronic kidney disease, stage 3 (moderate): Secondary | ICD-10-CM | POA: Diagnosis not present

## 2018-12-29 DIAGNOSIS — J84112 Idiopathic pulmonary fibrosis: Secondary | ICD-10-CM | POA: Diagnosis not present

## 2018-12-29 DIAGNOSIS — M5137 Other intervertebral disc degeneration, lumbosacral region: Secondary | ICD-10-CM | POA: Diagnosis not present

## 2018-12-29 DIAGNOSIS — I739 Peripheral vascular disease, unspecified: Secondary | ICD-10-CM | POA: Diagnosis not present

## 2018-12-29 DIAGNOSIS — J9611 Chronic respiratory failure with hypoxia: Secondary | ICD-10-CM | POA: Diagnosis not present

## 2018-12-30 ENCOUNTER — Telehealth: Payer: Self-pay | Admitting: Internal Medicine

## 2018-12-30 DIAGNOSIS — J84112 Idiopathic pulmonary fibrosis: Secondary | ICD-10-CM

## 2018-12-30 NOTE — Telephone Encounter (Signed)
Patient's wife,Janice, returned call, CB is [phone]

## 2018-12-30 NOTE — Telephone Encounter (Signed)
Called back, call went straight to VM.

## 2018-12-30 NOTE — Telephone Encounter (Signed)
Left message for Janice to call back.

## 2018-12-31 NOTE — Telephone Encounter (Signed)
Patient wife returned call, she states they attempted to get hospice care for her husband but because he is on esbriet they could not take him. She states the esbriet is not really helping him so can they taper him off of the esbriet.   MR please advise. If okay with this please give taper directions. Thanks.

## 2018-12-31 NOTE — Telephone Encounter (Signed)
Spoke with pt wife, [name], and relayed Dr Golden Pop recommendations.  Placed order for hospice care with Dr. Chase Caller as attending.  Nothing further is needed.

## 2018-12-31 NOTE — Telephone Encounter (Signed)
Stop esbriet - can be quit cold [place]  Ok for hospice - refer to [facility]  I can be hospice attending

## 2019-01-01 ENCOUNTER — Telehealth: Payer: Self-pay | Admitting: Internal Medicine

## 2019-01-01 NOTE — Telephone Encounter (Signed)
LMTCB

## 2019-01-01 NOTE — Telephone Encounter (Signed)
Called and spoke with Patient.  No new phone messages in chart since 12/31/18.  Went over Dr Chase Caller recommendations from 12/31/18 phone note. Understanding stated.  Nothing further at this time.

## 2019-01-01 NOTE — Telephone Encounter (Signed)
Patient's wife Shawn Cruz returning phone call.  Patient phone number is [phone].

## 2019-01-05 ENCOUNTER — Telehealth: Payer: Self-pay | Admitting: Internal Medicine

## 2019-01-05 NOTE — Telephone Encounter (Signed)
Called and spoke with patients wife. She stated that she had a message on her machine to give [place] a call back. Patients wife stated that she is not sure if this is an old message or not. Looked back at last phone note and copied it below.     Elton Sin, LPN       9/44/96 75:91 PM  Note    Called and spoke with Patient.  No new phone messages in chart since 12/31/18.  Went over Dr Chase Caller recommendations from 12/31/18 phone note. Understanding stated.  Nothing further at this time.     -------------------------------------------------------------------------------------------------------------------------------  Advised patient this was an old message. Nothing further needed.

## 2019-01-10 DIAGNOSIS — M4716 Other spondylosis with myelopathy, lumbar region: Secondary | ICD-10-CM | POA: Diagnosis not present

## 2019-01-10 DIAGNOSIS — J849 Interstitial pulmonary disease, unspecified: Secondary | ICD-10-CM | POA: Diagnosis not present

## 2019-01-27 ENCOUNTER — Ambulatory Visit: Payer: Medicare Other | Admitting: Internal Medicine

## 2019-02-10 DIAGNOSIS — J849 Interstitial pulmonary disease, unspecified: Secondary | ICD-10-CM | POA: Diagnosis not present

## 2019-02-10 DIAGNOSIS — M4716 Other spondylosis with myelopathy, lumbar region: Secondary | ICD-10-CM | POA: Diagnosis not present

## 2019-02-17 ENCOUNTER — Telehealth: Payer: Self-pay | Admitting: Internal Medicine

## 2019-02-17 NOTE — Telephone Encounter (Signed)
Fax received from [facility]. Pt passed away February 20, 2019 at 950pm. Routing to MR as an FYI.

## 2019-02-17 NOTE — Telephone Encounter (Signed)
So sad. Please get me a condolence card

## 2019-02-20 NOTE — Telephone Encounter (Signed)
Condolence card has been obtained and has been placed on MR's desk. Nothing further needed.

## 2019-03-06 DEATH — deceased

## 2020-01-19 IMAGING — CT CT CHEST HIGH RESOLUTION W/O CM
2 of 5 series · 15 of 36 positions shown, 18 images · non-contrast
Comparison: 05/12/2015 chest radiograph. 12/21/2014 high-resolution
chest CT.

CLINICAL DATA: Follow-up interstitial lung disease. Worsening
dyspnea with exertion. Former smoker.

EXAM:
CT CHEST WITHOUT CONTRAST
TECHNIQUE: Multidetector CT imaging of the chest was performed following the
standard protocol without intravenous contrast. High resolution
imaging of the lungs, as well as inspiratory and expiratory imaging,
was performed.

[Series 2: high resolution · axial · 0.75mm/px · z∈[-317,-23]mm · 12 of 161 slices shown, 15 images]
[im 7/161  mediastinal]
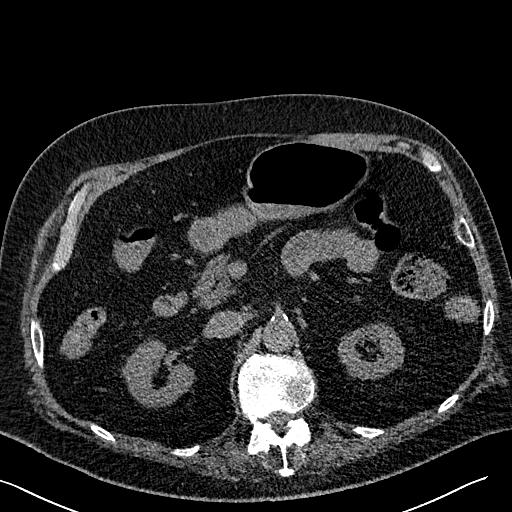
[im 7/161  lung]
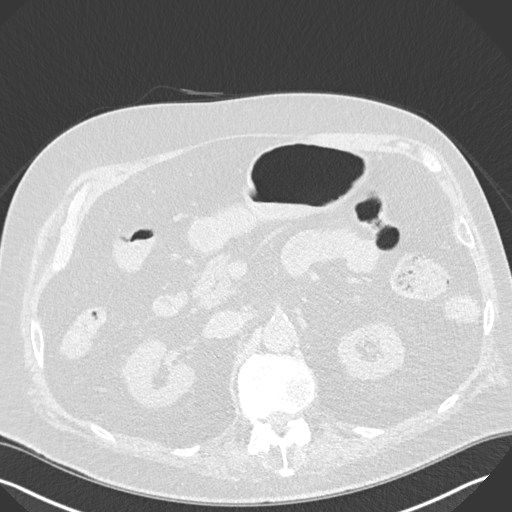
[im 21/161  lung]
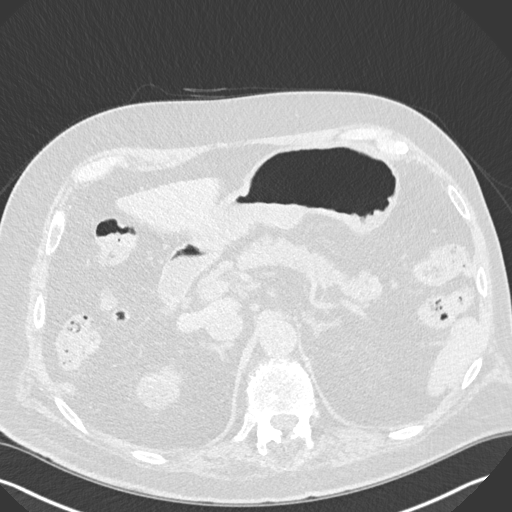
[im 35/161  lung]
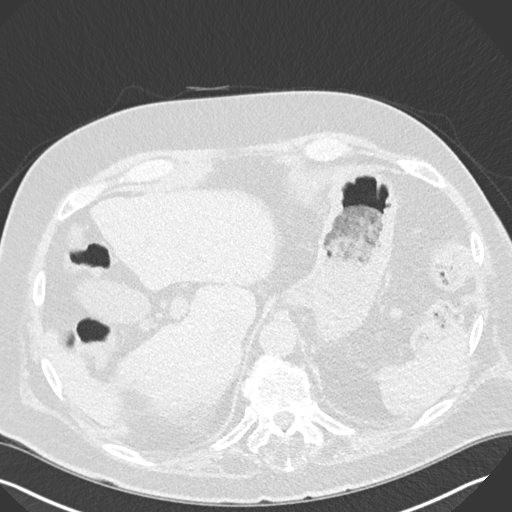
[im 49/161  lung]
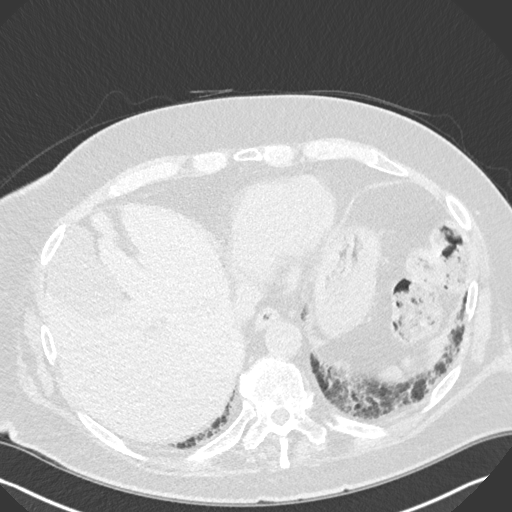
[im 63/161  mediastinal]
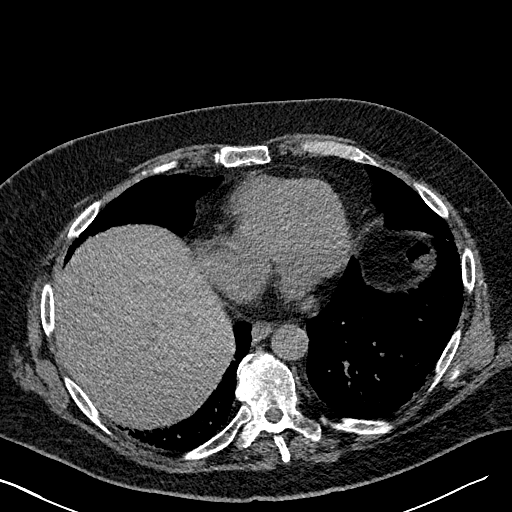
[im 63/161  lung]
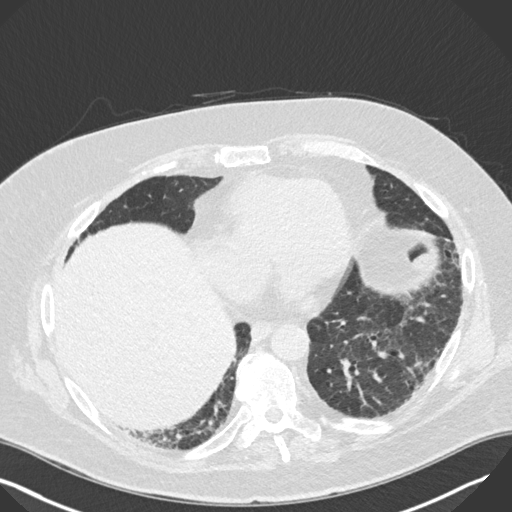
[im 77/161  lung]
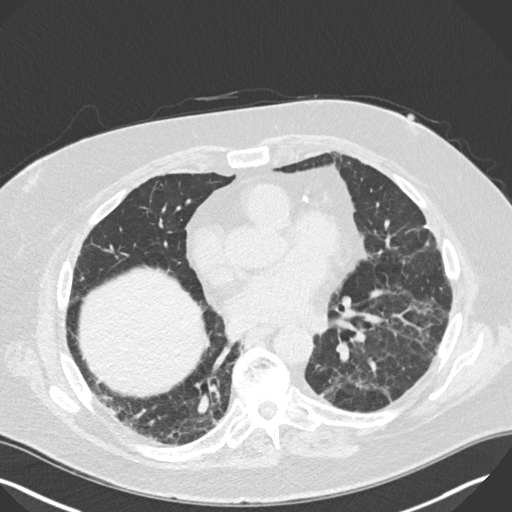
[im 84/161  lung]
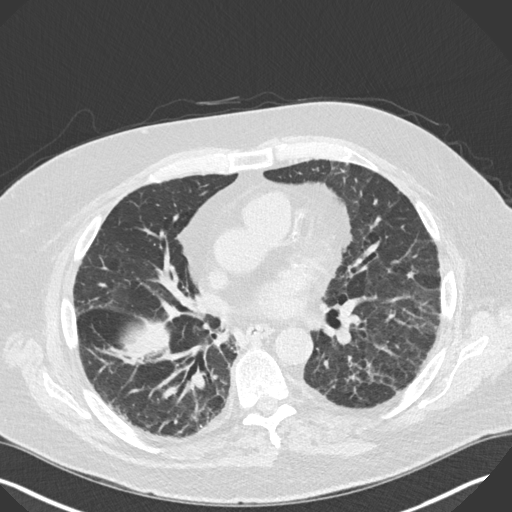
[im 98/161  lung]
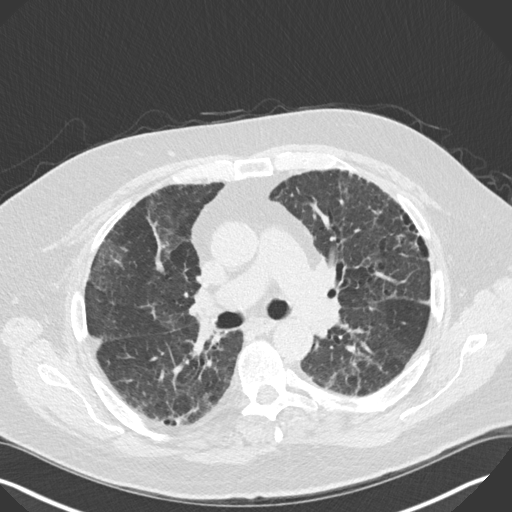
[im 112/161  mediastinal]
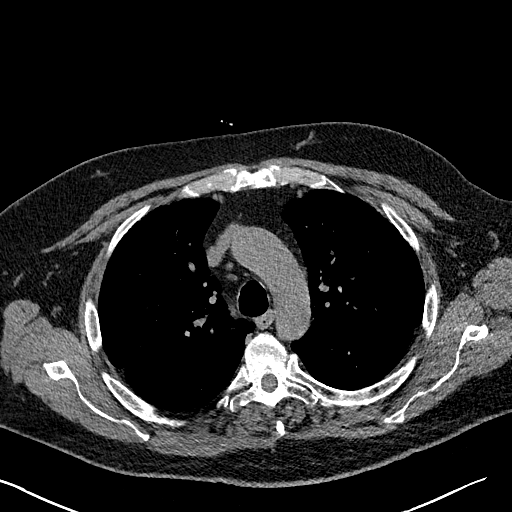
[im 112/161  lung]
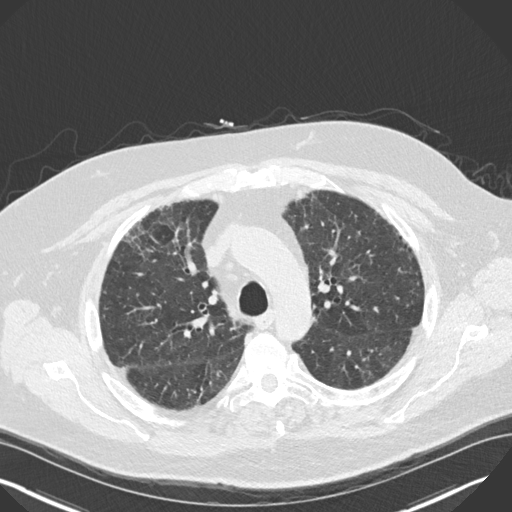
[im 126/161  lung]
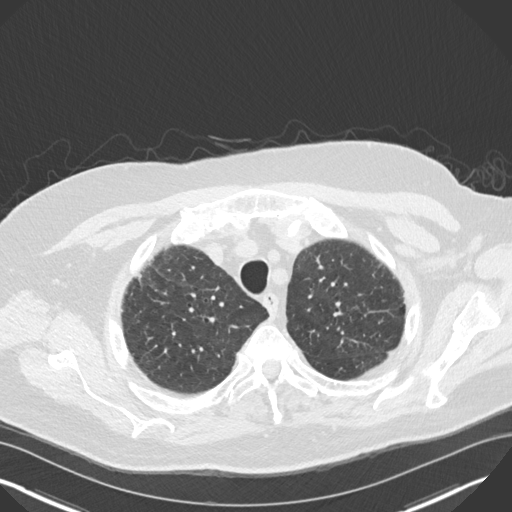
[im 140/161  lung]
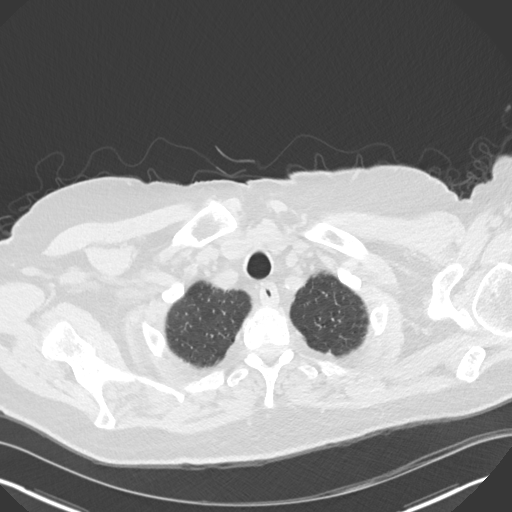
[im 154/161  lung]
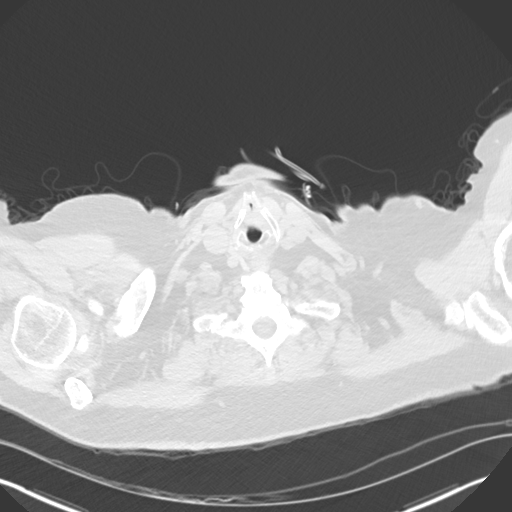

[Series 8: coronal · coronal · 0.63mm/px · 3 of 128 slices shown]
[im 26/128  lung]
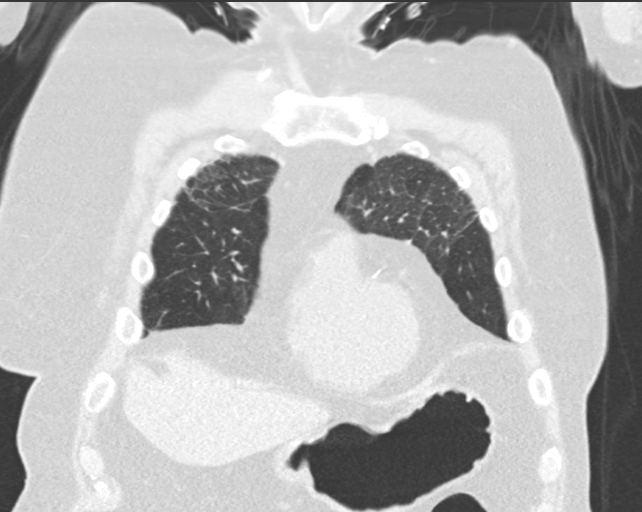
[im 51/128  lung]
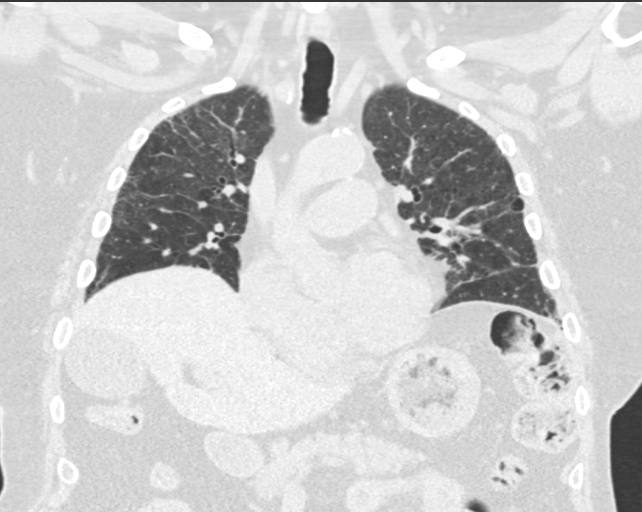
[im 77/128  lung]
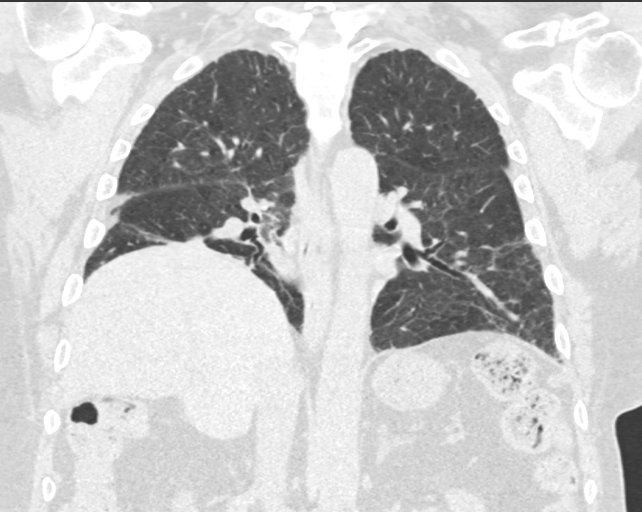

[15 of 36 positions shown; findings below may reference images not displayed]

FINDINGS: Cardiovascular: Normal heart size. No significant pericardial
effusion/thickening. Left anterior descending and right coronary
atherosclerosis. Atherosclerotic nonaneurysmal thoracic aorta.
Top-normal caliber main pulmonary artery (3.0 cm diameter), stable.

Mediastinum/Nodes: No discrete thyroid nodules. Unremarkable
esophagus. No axillary adenopathy. Newly mildly enlarged 1.0 cm
right paratracheal node (series 2/image 56). Newly mildly enlarged
1.0 cm AP window node (series 2/image 72). No discrete hilar
adenopathy on these noncontrast images.

Lungs/Pleura: No pneumothorax. No pleural effusion. No acute
consolidative airspace disease, lung masses or significant pulmonary
nodules. Mild patchy air trapping in both lungs on the expiration
sequence. There is patchy subpleural and peripheral
peribronchovascular reticulation and mild ground-glass attenuation
in both lungs with associated mild traction bronchiectasis and mild
architectural distortion. There is a mild basilar gradient to these
findings. There are small foci of honeycombing in the left upper and
left lower lobes. These findings have progressed since 12/21/2014.

Upper abdomen: Nonobstructing 5 mm right renal stone. Simple 2.0 cm
upper right renal cyst.

Musculoskeletal: No aggressive appearing focal osseous lesions.
Moderate thoracic spondylosis. Partially visualized posterior spinal
fusion hardware in upper lumbar spine on the scout radiograph.
IMPRESSION: 1. Basilar predominant fibrotic interstitial lung disease with mild
honeycombing, progressed since 4187, typical of usual interstitial
pneumonia (UIP).
2. New mild mediastinal adenopathy, nonspecific, probably reactive.
3. Two-vessel coronary atherosclerosis.
4. Nonobstructing right nephrolithiasis.

Aortic Atherosclerosis ([id number]).
